# Patient Record
Sex: Female | Born: 1955 | Race: White | Hispanic: No | Marital: Married | Smoking: Never smoker
Health system: Southern US, Community
[De-identification: ages and names within clinical notes are randomized; demographics above are authoritative.]

## PROBLEM LIST (undated history)

## (undated) DIAGNOSIS — E559 Vitamin D deficiency, unspecified: Secondary | ICD-10-CM

## (undated) DIAGNOSIS — Z9889 Other specified postprocedural states: Secondary | ICD-10-CM

## (undated) DIAGNOSIS — T8859XA Other complications of anesthesia, initial encounter: Secondary | ICD-10-CM

## (undated) DIAGNOSIS — C50111 Malignant neoplasm of central portion of right female breast: Principal | ICD-10-CM

## (undated) DIAGNOSIS — Q7240 Longitudinal reduction defect of unspecified femur: Secondary | ICD-10-CM

## (undated) DIAGNOSIS — R42 Dizziness and giddiness: Secondary | ICD-10-CM

## (undated) DIAGNOSIS — Z9710 Presence of artificial limb (complete) (partial), unspecified: Secondary | ICD-10-CM

## (undated) DIAGNOSIS — T4145XA Adverse effect of unspecified anesthetic, initial encounter: Secondary | ICD-10-CM

## (undated) DIAGNOSIS — K219 Gastro-esophageal reflux disease without esophagitis: Secondary | ICD-10-CM

## (undated) DIAGNOSIS — R112 Nausea with vomiting, unspecified: Secondary | ICD-10-CM

## (undated) DIAGNOSIS — IMO0002 Reserved for concepts with insufficient information to code with codable children: Secondary | ICD-10-CM

## (undated) DIAGNOSIS — Z923 Personal history of irradiation: Secondary | ICD-10-CM

## (undated) DIAGNOSIS — M4800 Spinal stenosis, site unspecified: Secondary | ICD-10-CM

## (undated) HISTORY — DX: Spinal stenosis, site unspecified: M48.00

## (undated) HISTORY — DX: Personal history of irradiation: Z92.3

## (undated) HISTORY — DX: Malignant neoplasm of central portion of right female breast: C50.111

## (undated) HISTORY — PX: BREAST LUMPECTOMY: SHX2

## (undated) HISTORY — DX: Dizziness and giddiness: R42

## (undated) HISTORY — DX: Vitamin D deficiency, unspecified: E55.9

## (undated) HISTORY — DX: Longitudinal reduction defect of unspecified femur: Q72.40

## (undated) HISTORY — DX: Presence of artificial limb (complete) (partial), unspecified: Z97.10

## (undated) HISTORY — DX: Gastro-esophageal reflux disease without esophagitis: K21.9

## (undated) HISTORY — DX: Reserved for concepts with insufficient information to code with codable children: IMO0002

---

## 1998-06-09 ENCOUNTER — Encounter: Admission: RE | Admit: 1998-06-09 | Discharge: 1998-09-07 | Payer: Self-pay | Admitting: Family Medicine

## 1998-07-22 ENCOUNTER — Encounter: Admission: RE | Admit: 1998-07-22 | Discharge: 1998-10-20 | Payer: Self-pay | Admitting: Orthopedic Surgery

## 1998-11-24 ENCOUNTER — Encounter: Admission: RE | Admit: 1998-11-24 | Discharge: 1999-01-07 | Payer: Self-pay | Admitting: Family Medicine

## 1999-01-06 ENCOUNTER — Encounter: Admission: RE | Admit: 1999-01-06 | Discharge: 1999-02-25 | Payer: Self-pay

## 2007-04-06 ENCOUNTER — Ambulatory Visit (HOSPITAL_COMMUNITY): Admission: RE | Admit: 2007-04-06 | Discharge: 2007-04-06 | Payer: Self-pay | Admitting: Obstetrics and Gynecology

## 2008-02-12 ENCOUNTER — Inpatient Hospital Stay (HOSPITAL_COMMUNITY): Admission: RE | Admit: 2008-02-12 | Discharge: 2008-02-15 | Payer: Self-pay | Admitting: Orthopedic Surgery

## 2008-09-27 HISTORY — PX: REPLACEMENT TOTAL KNEE: SUR1224

## 2009-12-23 ENCOUNTER — Ambulatory Visit (HOSPITAL_COMMUNITY): Admission: RE | Admit: 2009-12-23 | Discharge: 2009-12-23 | Payer: Self-pay | Admitting: Obstetrics and Gynecology

## 2010-09-30 ENCOUNTER — Ambulatory Visit
Admission: RE | Admit: 2010-09-30 | Discharge: 2010-09-30 | Payer: Self-pay | Source: Home / Self Care | Attending: Gynecologic Oncology | Admitting: Gynecologic Oncology

## 2010-10-06 ENCOUNTER — Encounter: Payer: Self-pay | Admitting: Obstetrics & Gynecology

## 2010-10-06 ENCOUNTER — Ambulatory Visit (HOSPITAL_COMMUNITY)
Admission: RE | Admit: 2010-10-06 | Discharge: 2010-10-07 | Payer: Self-pay | Source: Home / Self Care | Attending: Obstetrics & Gynecology | Admitting: Obstetrics & Gynecology

## 2010-10-08 HISTORY — PX: ROBOTIC ASSISTED TOTAL HYSTERECTOMY WITH BILATERAL SALPINGO OOPHERECTOMY: SHX6086

## 2010-10-12 LAB — CBC
HCT: 40.2 % (ref 36.0–46.0)
HCT: 35.8 % — ABNORMAL LOW (ref 36.0–46.0)
Hemoglobin: 13.5 g/dL (ref 12.0–15.0)
Hemoglobin: 11.5 g/dL — ABNORMAL LOW (ref 12.0–15.0)
MCH: 30.2 pg (ref 26.0–34.0)
MCH: 29.3 pg (ref 26.0–34.0)
MCHC: 33.6 g/dL (ref 30.0–36.0)
MCHC: 32.1 g/dL (ref 30.0–36.0)
MCV: 89.9 fL (ref 78.0–100.0)
MCV: 91.1 fL (ref 78.0–100.0)
Platelets: 307 10*3/uL (ref 150–400)
Platelets: 272 10*3/uL (ref 150–400)
RBC: 4.47 MIL/uL (ref 3.87–5.11)
RBC: 3.93 MIL/uL (ref 3.87–5.11)
RDW: 13.2 % (ref 11.5–15.5)
RDW: 13.1 % (ref 11.5–15.5)
WBC: 7.5 10*3/uL (ref 4.0–10.5)
WBC: 12.7 10*3/uL — ABNORMAL HIGH (ref 4.0–10.5)

## 2010-10-12 LAB — COMPREHENSIVE METABOLIC PANEL
ALT: 24 U/L (ref 0–35)
AST: 23 U/L (ref 0–37)
Albumin: 4 g/dL (ref 3.5–5.2)
Alkaline Phosphatase: 113 U/L (ref 39–117)
BUN: 13 mg/dL (ref 6–23)
CO2: 25 mEq/L (ref 19–32)
Calcium: 9.2 mg/dL (ref 8.4–10.5)
Chloride: 109 mEq/L (ref 96–112)
Creatinine, Ser: 1.25 mg/dL — ABNORMAL HIGH (ref 0.4–1.2)
GFR calc Af Amer: 54 mL/min — ABNORMAL LOW (ref >60)
GFR calc non Af Amer: 44 mL/min — ABNORMAL LOW (ref >60)
Glucose, Bld: 88 mg/dL (ref 70–99)
Potassium: 4.8 mEq/L (ref 3.5–5.1)
Sodium: 142 mEq/L (ref 135–145)
Total Bilirubin: 0.5 mg/dL (ref 0.3–1.2)
Total Protein: 7.6 g/dL (ref 6.0–8.3)

## 2010-10-12 LAB — TYPE AND SCREEN
ABO/RH(D): O POS
Antibody Screen: NEGATIVE

## 2010-10-12 LAB — DIFFERENTIAL
Basophils Absolute: 0 10*3/uL (ref 0.0–0.1)
Basophils Relative: 1 % (ref 0–1)
Eosinophils Absolute: 0.1 10*3/uL (ref 0.0–0.7)
Eosinophils Relative: 2 % (ref 0–5)
Lymphocytes Relative: 25 % (ref 12–46)
Lymphs Abs: 1.9 10*3/uL (ref 0.7–4.0)
Monocytes Absolute: 0.4 10*3/uL (ref 0.1–1.0)
Monocytes Relative: 6 % (ref 3–12)
Neutro Abs: 5.1 10*3/uL (ref 1.7–7.7)
Neutrophils Relative %: 67 % (ref 43–77)

## 2010-10-12 LAB — SURGICAL PCR SCREEN
MRSA, PCR: NEGATIVE
Staphylococcus aureus: NEGATIVE

## 2010-10-18 ENCOUNTER — Encounter: Payer: Self-pay | Admitting: Obstetrics and Gynecology

## 2010-11-04 ENCOUNTER — Ambulatory Visit: Payer: BC Managed Care – PPO | Attending: Gynecologic Oncology | Admitting: Gynecologic Oncology

## 2010-11-04 DIAGNOSIS — Z09 Encounter for follow-up examination after completed treatment for conditions other than malignant neoplasm: Secondary | ICD-10-CM | POA: Insufficient documentation

## 2010-11-04 DIAGNOSIS — C549 Malignant neoplasm of corpus uteri, unspecified: Secondary | ICD-10-CM | POA: Insufficient documentation

## 2010-11-04 DIAGNOSIS — Z90710 Acquired absence of both cervix and uterus: Secondary | ICD-10-CM | POA: Insufficient documentation

## 2010-11-06 NOTE — Consult Note (Signed)
NAME:  Raven Marks, Raven Marks             ACCOUNT NO.:  [id number]  MEDICAL RECORD NO.:  [id number]          PATIENT TYPE:  OIB  LOCATION:  1537                         FACILITY:  [facility]  PHYSICIAN:  Itsel Opfer A. Duard Brady, MD    DATE OF BIRTH:  08-31-1956  DATE OF CONSULTATION:  11/04/2010 DATE OF DISCHARGE:  10/07/2010                                CONSULTATION   HISTORY OF PRESENT ILLNESS:  Ms. Ghosh is a very pleasant 55 year old who is referred to [place] secondary to an endometrial biopsy that revealed a grade 1 endometrioid adenocarcinoma.  Subsequently on October 07, 2010, she underwent robotic hysterectomy, BSO, pelvic and right paraaortic lymph node dissection.  Operative findings included a globular fibroid uterus.  The uterus was 10 weeks' size.  Frozen section revealed a grade 2 lesion with less than but close to 50% myometrial invasion.  She did well postoperatively.  Final pathology was consistent with a stage IA grade 2 endometrioid adenocarcinoma.  She had a grade 2 lesion 0.7 out of 1.2 cm in thickness.  There was no lymphovascular space involvement. The 0/12 lymph nodes were involved.  I did speak with her regarding her pathology last week.  She comes in today for her postoperative check. She is overall doing quite well.  She does complain of a little bit of anxiety in the late afternoon when her husband goes to work.  She does some deep breathing exercises and it resolves itself.  She would however want a prescription for Ativan.  She does have some occasional bladder twinges and a little bit of spotting that started this week but is otherwise doing well.  She feels that she will be ready to return to work next Thursday and a note will be given to her to that effect.  Last week on the telephone, we had discussed the need for proceeding with postoperative vaginal cuff brachytherapy secondary to the depth of invasion, the grade, as well as her age.  She is willing to proceed  with that.  PHYSICAL EXAMINATION:  VITAL SIGNS:  Weight 174 pounds, height 5 feet 4 inches, blood pressure 110/78, pulse 74, respirations 18, temperature 98.2. GENERAL:  A well-nourished, well developed female in no acute distress. ABDOMEN:  She has well-healed surgical incisions.  Abdomen is soft and nontender. PELVIC:  External genitalia within normal limits.  The vaginal cuff is healing well.  Suture line is still visible.  Bimanual examination feels no masses or nodularity.  The cuff is nontender.  ASSESSMENT AND PLAN:  A 55-year with a stage IB grade 2 endometrioid adenocarcinoma.  She is doing well postoperatively.  She still has her suture line intact.  Discussed with her and her husband not to proceed with intercourse for several weeks.  She has an appointment to see Dr. Roselind Messier next week on November 09, 2010, for consideration of vaginal cuff brachytherapy.  This procedure was discussed with the patient.  She knows that final details will be given to her by Dr. Roselind Messier.  She will return to see [place] for followup once she completes that radiation therapy.  Her questions were listed and answered to her  satisfaction.  She is very comfortable with the plan.     Bayne Fosnaugh A. Duard Brady, MD     PAG/MEDQ  D:  11/04/2010  T:  11/04/2010  Job:  [id number]  cc:   M. Leda Quail, MD Fax: [phone]  Telford Nab, R.N. [address] [place], [place] [address]  Billie Lade, M.D. Fax: [phone]  Electronically Signed by Cleda Mccreedy MD on 11/06/2010 09:53:30 AM

## 2010-11-09 ENCOUNTER — Ambulatory Visit: Payer: BC Managed Care – PPO | Attending: Radiation Oncology | Admitting: Radiation Oncology

## 2010-11-09 DIAGNOSIS — Z79899 Other long term (current) drug therapy: Secondary | ICD-10-CM | POA: Insufficient documentation

## 2010-11-09 DIAGNOSIS — C549 Malignant neoplasm of corpus uteri, unspecified: Secondary | ICD-10-CM | POA: Insufficient documentation

## 2010-11-09 DIAGNOSIS — Z90710 Acquired absence of both cervix and uterus: Secondary | ICD-10-CM | POA: Insufficient documentation

## 2010-11-09 DIAGNOSIS — Z800 Family history of malignant neoplasm of digestive organs: Secondary | ICD-10-CM | POA: Insufficient documentation

## 2010-11-09 DIAGNOSIS — Z9079 Acquired absence of other genital organ(s): Secondary | ICD-10-CM | POA: Insufficient documentation

## 2010-11-09 DIAGNOSIS — Z510 Encounter for antineoplastic radiation therapy: Secondary | ICD-10-CM | POA: Insufficient documentation

## 2010-11-09 DIAGNOSIS — Z96659 Presence of unspecified artificial knee joint: Secondary | ICD-10-CM | POA: Insufficient documentation

## 2011-02-09 NOTE — H&P (Signed)
NAME:  Raven Marks, Raven Marks             ACCOUNT NO.:  [id number]   MEDICAL RECORD NO.:  [id number]          PATIENT TYPE:  INP   LOCATION:  NA                           FACILITY:  [facility]   PHYSICIAN:  Alexzandrew L. Perkins, P.A.C.DATE OF BIRTH:  Feb 15, 1956   DATE OF ADMISSION:  DATE OF DISCHARGE:                              HISTORY & PHYSICAL   CHIEF COMPLAINT:  Left knee pain.   HISTORY OF PRESENT ILLNESS:  A 55 year old female who has been seen by  Dr. Lequita Halt for ongoing knee pain.  She has end-stage arthritis and it  has been progressively getting worse interfering with activities.  She  is noted to be bone on bone in medial and patellofemoral compartments.  It was felt she would benefit undergoing surgery.  Risks and benefits  discussed.  Patient subsequently admitted to the hospital.  She has been  seen by Dr. Elana Alm preoperatively and felt to be stable to undergo  surgery.   ALLERGIES:  PENICILLIN.   CURRENT MEDICATIONS:  Advil, Tylenol, Sudafed, Dramamine, vitamins,  Pepcid AC.   PAST MEDICAL HISTORY:  1. Vertigo.  2. Anxiety.  3. Tinnitus.  4. History of bronchitis.  5. Occasional heartburn.  6. Arthritis.  7. Postmenopausal.   PAST SURGICAL HISTORY:  Knee arthroscopy, June of 2008   FAMILY HISTORY:  Father deceased with history of colon cancer.  Mother  deceased with history of colon cancer.  One son and two sisters.   SOCIAL HISTORY:  Married, works as a Runner, broadcasting/film/video.  Nonsmoker.  No alcohol.  Family will be assisting with care after surgery.   REVIEW OF SYSTEMS:  GENERAL:  No fevers, chills, occasional mild night  sweats.  NEURO:  No seizures, syncope, or paralysis.  She does have a  history of vertigo with dizziness and tinnitus.  HEENT: Normocephalic,  atraumatic.      Alexzandrew L. Perkins, P.A.C.     ALP/MEDQ  D:  02/11/2008  T:  02/11/2008  Job:  [id number]   cc:   S. Kyra Manges, M.D.  Fax: [phone]   Ollen Gross, M.D.  Fax: [phone]

## 2011-02-09 NOTE — H&P (Signed)
NAME:  Raven Marks, Raven Marks NO.:  [id number]   MEDICAL RECORD NO.:  [id number]           PATIENT TYPE:   LOCATION:                                 FACILITY:   PHYSICIAN:  Ollen Gross, M.D.    DATE OF BIRTH:  1955/10/29   DATE OF ADMISSION:  02/12/2008  DATE OF DISCHARGE:                              HISTORY & PHYSICAL   CHIEF COMPLAINT:  Left knee pain.   HISTORY OF PRESENT ILLNESS:  The patient is a 55 year old female who has  seen by Dr. Lequita Halt for ongoing left knee pain.  She has known end-stage  arthritis with bone-on-bone in the medial and patellofemoral  compartments.  It is progressively getting worse with time.  It is felt  she would benefit undergoing surgical intervention.  Risks and benefits  were discussed.  The patient was subsequently admitted to the hospital.  She has been seen preoperatively by Dr. Elana Alm and is felt to be stable  for surgery.   ALLERGIES:  PENICILLIN.   CURRENT MEDICATIONS:  1. Advil.  2. Tylenol.  3. Sudafed.  4. Dramamine.  5. Vitamins.  6. Pepcid AC.   PAST MEDICAL HISTORY:  Vertigo, anxiety, tinnitus, bronchitis,  occasional heartburn, arthritis, postmenopausal.   PAST SURGICAL HISTORY:  Knee arthroscopy.   FAMILY HISTORY:  Father deceased with colon cancer.  Mother deceased  with colon cancer.  One son and two sisters.   SOCIAL HISTORY:  Married, Runner, broadcasting/film/video, nonsmoker.  No alcohol.  Family will  be assisting with care after surgery.   REVIEW OF SYSTEMS:  GENERAL:  No fevers, chills, occasional mild night  sweats.  NEURO:  No seizures or paralysis although she does have vertigo  and tinnitus.  RESPIRATORY:  No shortness breath, productive cough or  hemoptysis.  CARDIOVASCULAR:  No chest pain, angina, orthopnea.  GI:  Occasional heartburn.  No nausea, diarrhea, constipation.  GU:  No  dysuria, hematuria or discharge.  MUSCULOSKELETAL:  Left knee.   PHYSICAL EXAMINATION:  VITAL SIGNS:  Pulse 72, respirations 14,  blood  pressure 118/72.  GENERAL:  A 55 year old female well-nourished, well-developed, in no  acute distress.  She is alert and oriented, cooperative, pleasant and  good historian, accompanied by her husband.  HEENT:  Normocephalic, atraumatic.  Pupils are round and reactive.  Oropharynx clear.  EOMs intact.  NECK:  Supple.  CHEST:  Clear anterior and posterior chest walls.  HEART:  Regular rate and rhythm.  No murmur.  ABDOMEN:  Soft, slightly round.  Bowel sounds present.  BREASTS:  Not done and not pertinent to present illness.  GENITALIA:  Not done and not pertinent to present illness.  EXTREMITIES:  Left knee range of motion 0-120, moderate crepitus is  noted.  No effusion.  The right knee, please note she has an above knee  amputation.  She does have a long prosthetic leg already fit.   IMPRESSION:  1. Osteoarthritis left knee.  2. Vertigo.  3. Anxiety.  4. Tinnitus.  5. Bronchitis.  6. Occasional heartburn.  7. Postmenopausal.  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   PLAN:  The patient will be admitted to [facility] to undergo a  left total knee replacement arthroplasty.  Surgery will be performed by  Dr. Ollen Gross.      Alexzandrew L. Perkins, P.A.C.      Ollen Gross, M.D.  Electronically Signed    ALP/MEDQ  D:  02/11/2008  T:  02/11/2008  Job:  [id number]   cc:   Ollen Gross, M.D.  Fax: [phone]   S. Kyra Manges, M.D.  Fax: [phone]

## 2011-02-09 NOTE — Op Note (Signed)
NAMELUDIA, Marks             ACCOUNT NO.:  [id number]   MEDICAL RECORD NO.:  [id number]          PATIENT TYPE:  INP   LOCATION:  1613                         FACILITY:  [facility]   PHYSICIAN:  Ollen Gross, M.D.    DATE OF BIRTH:  [birth year]   DATE OF PROCEDURE:  02/12/2008  DATE OF DISCHARGE:                               OPERATIVE REPORT   PREOPERATIVE DIAGNOSIS:  Osteoarthritis left knee.   POSTOPERATIVE DIAGNOSIS:  Osteoarthritis left knee.   PROCEDURE:  Left total knee arthroplasty.   SURGEON:  Ollen Gross, M.D.   ASSISTANT:  Alexzandrew L. Perkins, P.A.C.   ANESTHESIA:  General with postop Marcaine pain pump.   ESTIMATED BLOOD LOSS:  Minimal.   DRAINS:  None.   COMPLICATIONS:  None.   DISPOSITION:  To recovery.   TOURNIQUET TIME:  31 minutes at 300 mmHg.   CLINICAL NOTE:  Raven Marks is a 55 year old female with end-stage arthritis  of left knee with progressively worsening pain and dysfunction.  She is  an above-knee amputee on the right; thus relies heavily on her left knee  for weightbearing activities.  She has end-stage arthritis of the knee.  She presents now for left total knee arthroplasty.   PROCEDURE IN DETAIL:  After the successful administration of general  anesthetic a tourniquet is placed on the left thigh and left lower  extremity was prepped and draped in the usual sterile fashion.  The  extremity is wrapped in Esmarch, knee flexed and tourniquet inflated to  300 mmHg.  Midline incision is a 10 blade through subcutaneous tissue to  the level of the extensor mechanism.  A fresh blade is used make a  medial parapatellar arthrotomy.  Soft tissue over the proximal medial  tibia is subperiosteally elevated to the joint line with the knife into  the center of this bursa with a Cobb elevator.  Soft tissue laterally is  elevated with attention being paid to avoid the patellar tendon on the  tibial tubercle.  The patella subluxed laterally, knee flexed  90  degrees, ACL and PCL removed.  Drill was used to create a starting hole  in the distal femur and the canal was thoroughly irrigated.  The 5-  degree left valgus alignment guide is placed referencing the posterior  condyles,rotations marked and a block pinned to remove 10 mm of the  distal femur.  Distal femoral resection made with an oscillating saw.  Sizing blocks placed.  Size 3 is most appropriate.  Rotation is marked  at the epicondylar axis.  A size 3 cutting block is placed and the  anterior-posterior chamfer cuts made.   The tibia is subluxed forward and the menisci removed.  The  extramedullary tibial alignment guide is placed referencing proximally  at the medial aspect of the tibial tubercle and distally along the  second metatarsal axis and tibial crest.  Blocks pinned to remove  approximately 10 mm off the less deficient lateral side.  We took an  additional 2 mm to get to the base of the medial defect.  The resection  is made with an oscillating saw.  Size 3 is the most appropriate tibial  component and the proximal tibia prepared the modular drill and keel  punch for a size 3.  Femoral preparation is completed with the  intercondylar cut.   Size 3 mobile bearing tibial trial, size 3 posterior stabilized femoral  trial and a 12.5 mm posterior stabilized rotating platform insert trial  placed.  The 12.5 hyperextended but the 15  allowed for full extension  with excellent varus/ valgus, anterior and posterior balance throughout  full range of motion.  Patella was everted and thickness measured to be  23 mm.  Freehand resection taken to 14 mm, 38 template is placed, lug  holes were drilled, trial patella was placed and it tracks normally.  Osteophytes removed off the posterior femur with the trial in place.  All trials were removed and the cut bone surfaces are prepared with  pulsatile lavage.  Cement was mixed and once ready for implantation, the  size 3 mobile bearing  tibial tray, size 3 posterior stabilized femur and  38 patella are cemented into place. Patella is held with a clamp.  Trial  15-mm inserts placed, knee held in full extension and all extruded  cement removed.  The cement is fully hardened and the permanent 15 mm  posterior stabilized rotating platform insert is placed into the tibial  tray.  Wound copiously irrigated saline solution.  FloSeal injected on  the posterior capsule and in the mediolateral gutters.  The permanent 15  mm posterior stabilized rotating platform insert had been placed in the  tibial tray.  Moist sponge is placed and the tourniquet released for  total time of 31 minutes.  Sponge is released after about 5 minutes and  minimal bleeding was encountered.  The bleeding that was encountered was  stopped with electrocautery.  Wounds again copiously irrigated saline  solution and the extensor mechanism closed with interrupted #1 PDS.  Flexion against gravity to 135 degrees.  Subcu closed with interrupted 2-  0 Vicryl and subcuticular running 4-0 Monocryl.  The catheter for  Marcaine pain pump is placed and the pump initiated.  Steri-Strips and  bulky sterile dressing are applied.  She is then placed into a knee  immobilizer, awakened and transferred to recovery in stable condition.      Ollen Gross, M.D.  Electronically Signed     FA/MEDQ  D:  02/12/2008  T:  02/12/2008  Job:  [id number]

## 2011-02-09 NOTE — Discharge Summary (Signed)
NAMEDENIESE, OBERRY             ACCOUNT NO.:  [id number]   MEDICAL RECORD NO.:  [id number]          PATIENT TYPE:  INP   LOCATION:  1613                         [facility]ACILITY[facility]   PHYSICIAN:  Ollen Gross, M.D.    DATE OF BIRTH:  18-May-1956   DATE OF ADMISSION:  02/12/2008  DATE OF DISCHARGE:  02/15/2008                               DISCHARGE SUMMARY   ADMITTING DIAGNOSES:  1. Osteoarthritis left knee.  2. Vertigo.  3. Anxiety.  4. Tinnitus.  5. Bronchitis.  6. Occasional heartburn  7. Postmenopausal.  8. Right above-knee amputation with prosthetic leg.   DISCHARGE DIAGNOSES:  1. Osteoarthritis left knee, status post left total knee replacement      arthroplasty.  2. Acute postoperative blood loss anemia, status post transfusion.  3. Vertigo.  4. Anxiety.  5. Tinnitus.  6. Bronchitis.  7. Occasional heartburn  8. Postmenopausal.  9. Right above-knee amputation with prosthetic leg.   PROCEDURE:  On Feb 12, 2008, left total knee.   SURGEON:  Dr. Lequita Halt.   ASSISTANT:  Alexzandrew L. Perkins, P.A.C.   ANESTHESIA:  General.   CONSULTS:  None.   BRIEF HISTORY:  Raven Marks is a 52-year female with end-stage arthritis of  the left knee with progressive worsening pain and dysfunction.  She has  an above-knee the patient on the right, thus relies on the left leg for  weightbearing activity.  She has end-stage arthritis and now presents  for total knee arthroplasty.   LABORATORY DATA:  Preop CBC showed a hemoglobin of 13.7, hematocrit of  40.0 white cell count 6.8, platelets 291.  Chem panel on admission all  within normal limits.  PT/INR 12.1 and 0.9 with a PT of 28  preoperatively.  Preop UA, small leukocyte esterase with only 0-2 white  cells, 0-2 red cells and rare bacteria.   CBCs were followed throughout the hospital course.  Hemoglobin dropped  down to 9.6 and to 8.3, and given 1 unit of blood.  Post transfusion  hemoglobin back up to 9.9 and 28.3.  Serial  protimes followed, last  noted PT/INR 21.7 and 1.80.  BMETs were followed.  Electrolytes remained  within normal limits.   EKG on Feb 08, 2008, normal sinus rhythm, no previous tracings,  confirmed by Dr. Vernie Shanks. DeGent.   HOSPITAL COURSE:  The patient admitted to [facility],  tolerated the procedure well, later transferred to the recovery room and  orthopedic floor.  Started on PCA and p.o. analgesics for pain control  following surgery.  Given 24 hours postop IV antibiotics.  Started on  Coumadin for DVT prophylaxis.  He had a very tough night with pain.  Changed medications around, did better with the Vicodin.  He was not  able to get much rest.  Seen in rounds on the morning of day #1,  recommend that she doff and don the prosthetic leg while in the bed to  help her with transferring.  Physical therapy started working with her.  There was a lot of bloody drainage through the dressing and was  reinforced and changed through the night.  We took down the reinforced  dressing on the morning of day #1.  The incision was healing well, no  active bleeding.  She had a little bit of asymptomatic hypotension, so  we gave her fluids.  Her hemoglobin was down a little bit, down to 9.6.  although she was asymptomatic with the hypotension and the anemia, we  put her on iron supplement.  She did have a history of vertigo, so we  resumed her meclizine.  By day #2, she had a rough night, headache.  We  encouraged her some caffeine.  We felt it could also be due to possibly  having the anemia.  Her hemoglobin drifted down a little bit further to  8.3.  Dressing change, and the incision looked good.  We felt we would  give her 1 unit.  She actually did extremely well with the unit and felt  much better.  She continued to progress with physical therapy and was  walking about 70 feet on day #2, despite having a low blood count.  She  was doing great by day #3.  Hemoglobin was back up to 9.9,  feeling good  and was discharged home.   DISCHARGE/PLAN:  1. Patient discharged home on Feb 15, 2008.  2. Discharge diagnoses, please see above.  3. Discharge meds - Vicodin, Robaxin, Nu-Iron and Coumadin.   DIET:  Resume home diet.   ACTIVITY:  Weightbearing as tolerated left lower extremity.  Home health  PT, home health nursing, total knee protocol.   DISPOSITION:  Home.  Follow-up in 2 weeks.   CONDITION ON DISCHARGE:  Improved.      Alexzandrew L. Perkins, P.A.C.      Ollen Gross, M.D.  Electronically Signed    ALP/MEDQ  D:  02/15/2008  T:  02/15/2008  Job:  [id number]   cc:   Ollen Gross, M.D.  Fax: [phone]   S. Kyra Manges, M.D.  Fax: [phone]

## 2011-03-17 ENCOUNTER — Other Ambulatory Visit: Payer: Self-pay | Admitting: Radiation Oncology

## 2011-03-17 ENCOUNTER — Ambulatory Visit
Admission: RE | Admit: 2011-03-17 | Discharge: 2011-03-17 | Disposition: A | Discharge status: Home / Self Care | Payer: BC Managed Care – PPO | Source: Ambulatory Visit | Attending: Radiation Oncology | Admitting: Radiation Oncology

## 2011-03-17 ENCOUNTER — Other Ambulatory Visit (HOSPITAL_COMMUNITY)
Admission: RE | Admit: 2011-03-17 | Discharge: 2011-03-17 | Disposition: A | Discharge status: Home / Self Care | Payer: BC Managed Care – PPO | Source: Ambulatory Visit | Attending: Radiation Oncology | Admitting: Radiation Oncology

## 2011-03-17 DIAGNOSIS — C549 Malignant neoplasm of corpus uteri, unspecified: Secondary | ICD-10-CM | POA: Insufficient documentation

## 2011-06-23 LAB — BASIC METABOLIC PANEL
BUN: 4 — ABNORMAL LOW
BUN: 5 — ABNORMAL LOW
BUN: 8
CO2: 26
CO2: 27
CO2: 28
Calcium: 7.9 — ABNORMAL LOW
Calcium: 7.9 — ABNORMAL LOW
Calcium: 8.3 — ABNORMAL LOW
Chloride: 104
Chloride: 107
Chloride: 110
Creatinine, Ser: 0.72
Creatinine, Ser: 0.72
Creatinine, Ser: 0.72
GFR calc Af Amer: >60
GFR calc Af Amer: >60
GFR calc Af Amer: >60
GFR calc non Af Amer: >60
GFR calc non Af Amer: >60
GFR calc non Af Amer: >60
Glucose, Bld: 108 — ABNORMAL HIGH
Glucose, Bld: 138 — ABNORMAL HIGH
Glucose, Bld: 89
Potassium: 3.6
Potassium: 3.9
Potassium: 4.7
Sodium: 140
Sodium: 140
Sodium: 141

## 2011-06-23 LAB — CBC
HCT: 24.1 — ABNORMAL LOW
HCT: 27.8 — ABNORMAL LOW
HCT: 28.3 — ABNORMAL LOW
Hemoglobin: 8.3 — ABNORMAL LOW
Hemoglobin: 9.6 — ABNORMAL LOW
Hemoglobin: 9.9 — ABNORMAL LOW
MCHC: 34.3
MCHC: 34.4
MCHC: 34.9
MCV: 87.8
MCV: 87.9
MCV: 88.3
Platelets: 176
Platelets: 200
Platelets: 205
RBC: 2.74 — ABNORMAL LOW
RBC: 3.14 — ABNORMAL LOW
RBC: 3.22 — ABNORMAL LOW
RDW: 12.7
RDW: 13
RDW: 13.3
WBC: 10.7 — ABNORMAL HIGH
WBC: 12.4 — ABNORMAL HIGH
WBC: 9.4

## 2011-06-23 LAB — TYPE AND SCREEN
ABO/RH(D): O POS
Antibody Screen: NEGATIVE

## 2011-06-23 LAB — PROTIME-INR
INR: 1.1
INR: 1.8 — ABNORMAL HIGH
INR: 1.8 — ABNORMAL HIGH
Prothrombin Time: 14.6
Prothrombin Time: 21 — ABNORMAL HIGH
Prothrombin Time: 21.7 — ABNORMAL HIGH

## 2011-06-23 LAB — PREPARE RBC (CROSSMATCH)

## 2011-06-23 LAB — ABO/RH: ABO/RH(D): O POS

## 2011-07-14 ENCOUNTER — Other Ambulatory Visit (HOSPITAL_COMMUNITY)
Admission: RE | Admit: 2011-07-14 | Discharge: 2011-07-14 | Disposition: A | Discharge status: Home / Self Care | Payer: BC Managed Care – PPO | Source: Ambulatory Visit | Attending: Gynecologic Oncology | Admitting: Gynecologic Oncology

## 2011-07-14 ENCOUNTER — Other Ambulatory Visit: Payer: Self-pay | Admitting: Gynecologic Oncology

## 2011-07-14 ENCOUNTER — Ambulatory Visit: Payer: BC Managed Care – PPO | Attending: Gynecologic Oncology | Admitting: Gynecologic Oncology

## 2011-07-14 DIAGNOSIS — Z90710 Acquired absence of both cervix and uterus: Secondary | ICD-10-CM | POA: Insufficient documentation

## 2011-07-14 DIAGNOSIS — C549 Malignant neoplasm of corpus uteri, unspecified: Secondary | ICD-10-CM | POA: Insufficient documentation

## 2011-07-14 DIAGNOSIS — Z9079 Acquired absence of other genital organ(s): Secondary | ICD-10-CM | POA: Insufficient documentation

## 2011-07-14 DIAGNOSIS — Z8540 Personal history of malignant neoplasm of unspecified female genital organ: Secondary | ICD-10-CM | POA: Insufficient documentation

## 2011-07-14 DIAGNOSIS — I890 Lymphedema, not elsewhere classified: Secondary | ICD-10-CM | POA: Insufficient documentation

## 2011-07-16 NOTE — Consult Note (Signed)
Raven Marks, Raven Marks             ACCOUNT NO.:  [id number]  MEDICAL RECORD NO.:  [id number]  LOCATION:  GYN                          FACILITY:  [facility]  PHYSICIAN:  Pecola Haxton A. Duard Brady, MD    DATE OF BIRTH:  11/15/1955  DATE OF CONSULTATION:  07/14/2011 DATE OF DISCHARGE:                                CONSULTATION   HISTORY OF PRESENT ILLNESS:  Raven Marks is a 55 year old who had a grade 1 endometrial cancer on biopsy.  In January 2012, she underwent robotic hysterectomy, BSO, bilateral pelvic and right para-aortic lymph node dissection.  Final pathology was consistent with a stage IA, grade 2 endometrioid adenocarcinoma.  She had grade 2 lesion with 0.7/1.2 cm of myometrial thickness.  There was no lymphovascular space involvement, 0/12 lymph nodes were involved.  She underwent postoperative radiation therapy under the care of Dr. Roselind Messier, consisting of HDR.  She was last seen by Dr. Roselind Messier in June 2012 at which time her exam and Pap smear were negative.   She comes in today for followup.  She is overall doing fairly well.  She does complain of some occasional lower abdominal cramping like a period.  It happens about once every few weeks.  It does not wake her up at night.  It lasts for a few minutes.  She also states that it is worse when she holds her bladder and she has not really thought about whether there is any pattern with her bowel, but does not require any pain medications.  She has some occasional hot flashes. Occasionally, she will have little bit of a white vaginal discharge. She denies any bleeding.  She states that since her surgery, but she is not exactly sure when it started, she has had a "sore spot" on her inner thigh when she palpates it.  It is sore to touch.  She has not really taken any ibuprofen or pain medicine to see if that helps.  Again, it has not limited her mobility and her activities, it is just sore to palpation.  She is very frustrated by her weight  gain, she has gained an additional 5 pounds since we last saw her.  She states she has never been this heavy and is very frustrated.  She did do Weight Watchers, lost a few pounds, but was frustrated by how slowly she was losing the weight and stopped being compliant.  PHYSICAL EXAMINATION:  VITAL SIGNS:  Weight 179 pounds, height 5 feet 4 inches, blood pressure 128/80, respirations 16, temperature 98. GENERAL:  Well-nourished, well-developed female, in no acute distress. NECK:  Supple.  There is no lymphadenopathy, no thyromegaly. LUNGS:  Clear to auscultation bilaterally. CARDIOVASCULAR:  Regular rate and rhythm. ABDOMEN:  She has well-healed surgical incisions.  Abdomen is soft, nontender, nondistended.  There are no palpable masses or hepatosplenomegaly.  Groins are negative for adenopathy. EXTREMITIES:  She has anatomically missing right leg with a prosthetic. The left leg is unremarkable. PELVIC:  External genitalia is notable for some swelling and asymmetry of the right labia majora, which is most consistent with lymphedema.  It is somewhat tender to touch.  The vagina is without lesions.  The vaginal cuff is visualized.  There are no visible lesions.  ThinPrep Pap was submitted without difficulty.  Bimanual examination reveals no masses or nodularity.  Rectal confirms.  ASSESSMENT: 59. A 55 year old with a stage IA, grade 2 endometrioid adenocarcinoma,     status post appropriate surgery and vaginal cuff brachytherapy who     appears to have lymphedema of her mons and it appears that, that in     conjunction with her weight gain is making her prosthetic device     not fit very well.  I discussed this with her, she is very relieved     to at least know that there might be an etiology for this and after     our discussion regarding lymphedema therapy, she is very much     interested in that.  A referral was made and she was given a phone     number for lymphedema therapy as well  as a prescription for     evaluation and treatment. 2. We will follow up on the results of her Pap smear from today. 3. She will see Dr. Roselind Messier in 3-4 months and return to see [place] in 6-8     months.     Raven Marks A. Duard Brady, MD     PAG/MEDQ  D:  07/14/2011  T:  07/14/2011  Job:  [id number]  cc:   Billie Lade, Ph.D., M.D. Fax: [phone]  Miguel Aschoff, M.D.  Telford Nab, R.N. [address] [place], [place] [address]  Leda Quail, MD  Electronically Signed by Cleda Mccreedy MD on 07/16/2011 11:07:11 AM

## 2012-01-10 ENCOUNTER — Ambulatory Visit
Admission: RE | Admit: 2012-01-10 | Discharge: 2012-01-10 | Disposition: A | Discharge status: Home / Self Care | Payer: BC Managed Care – PPO | Source: Ambulatory Visit | Attending: Family Medicine | Admitting: Family Medicine

## 2012-01-10 ENCOUNTER — Other Ambulatory Visit: Payer: Self-pay | Admitting: Family Medicine

## 2012-01-10 DIAGNOSIS — H539 Unspecified visual disturbance: Secondary | ICD-10-CM

## 2012-01-10 DIAGNOSIS — R51 Headache: Secondary | ICD-10-CM

## 2012-03-15 ENCOUNTER — Encounter: Payer: Self-pay | Admitting: Gynecologic Oncology

## 2012-03-16 ENCOUNTER — Encounter: Payer: Self-pay | Admitting: Gynecologic Oncology

## 2012-03-16 ENCOUNTER — Ambulatory Visit: Payer: BC Managed Care – PPO | Attending: Gynecologic Oncology | Admitting: Gynecologic Oncology

## 2012-03-16 ENCOUNTER — Other Ambulatory Visit (HOSPITAL_COMMUNITY)
Admission: RE | Admit: 2012-03-16 | Discharge: 2012-03-16 | Disposition: A | Discharge status: Home / Self Care | Payer: BC Managed Care – PPO | Source: Ambulatory Visit | Attending: Gynecologic Oncology | Admitting: Gynecologic Oncology

## 2012-03-16 VITALS — BP 148/76 | HR 78 | Temp 97.9°F | Resp 20 | Ht 64.0 in | Wt 184.0 lb

## 2012-03-16 DIAGNOSIS — Z90710 Acquired absence of both cervix and uterus: Secondary | ICD-10-CM | POA: Insufficient documentation

## 2012-03-16 DIAGNOSIS — C541 Malignant neoplasm of endometrium: Secondary | ICD-10-CM | POA: Insufficient documentation

## 2012-03-16 DIAGNOSIS — Z923 Personal history of irradiation: Secondary | ICD-10-CM | POA: Insufficient documentation

## 2012-03-16 DIAGNOSIS — C549 Malignant neoplasm of corpus uteri, unspecified: Secondary | ICD-10-CM | POA: Insufficient documentation

## 2012-03-16 DIAGNOSIS — Z01419 Encounter for gynecological examination (general) (routine) without abnormal findings: Secondary | ICD-10-CM | POA: Insufficient documentation

## 2012-03-16 DIAGNOSIS — K219 Gastro-esophageal reflux disease without esophagitis: Secondary | ICD-10-CM | POA: Insufficient documentation

## 2012-03-16 DIAGNOSIS — Z800 Family history of malignant neoplasm of digestive organs: Secondary | ICD-10-CM | POA: Insufficient documentation

## 2012-03-16 DIAGNOSIS — Z880 Allergy status to penicillin: Secondary | ICD-10-CM | POA: Insufficient documentation

## 2012-03-16 NOTE — Patient Instructions (Signed)
RTC to see Dr. Hyacinth Meeker in 6 months.

## 2012-03-16 NOTE — Progress Notes (Signed)
Consult Note: Gyn-Onc  Raven Marks 56 y.o. female  CC:  Chief Complaint  Patient presents with  . Endo ca    Follow up    HPI: HISTORY OF PRESENT ILLNESS: Raven Marks is a 56 year old who had a grade 1 endometrial cancer on biopsy. In January 2012, Raven Marks underwent robotic hysterectomy, BSO, bilateral pelvic and right para-aortic lymph  node dissection. Final pathology was consistent with a stage IA, grade 2 endometrioid adenocarcinoma. Raven Marks had grade 2 lesion with 0.7/1.2 cm of myometrial thickness. There was no lymphovascular space involvement,  0/12 lymph nodes were involved. Raven Marks underwent postoperative radiation therapy under the care of Dr. Roselind Messier, consisting of HDR. She was last seen by me in 10/12 at which time her exam and Pap smear  were negative.   Raven Marks comes in today for followup. Raven Marks is overall doing fairly well.   Interval History:  Raven Marks is scheduled for mammogram in August of this year.  Review of Systems Raven Marks still has occasional hot flashes and a few night sweats. They're the same they have not gotten any worse. It is not appear to have any relationship with Raven Marks's eating or drinking. They do not linger and they last about 5 minutes and essentially have been the same since I saw her in October. Raven Marks thinks that the hot flashes are overall tolerable. Raven Marks denies any vaginal bleeding. 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 Raven Marks does have some soreness on the mons. We diagnosed her with lymphedema when I last saw her and Raven Marks did not see the lymphedema therapist as Raven Marks thought it was better. Raven Marks would be interested in seeing them. Raven Marks did hurt her knee after a fall and has had some left knee swelling. Raven Marks will be seeing her physician for this. Raven Marks otherwise denies any complaints and is a 10 point negative review of systems. 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 The going to be [employer] for the summer vacation.  Current Meds:  Outpatient Encounter Prescriptions as of 03/16/2012  Medication Sig Dispense Refill  . Loratadine (CLARITIN PO) Take 10 mg by mouth.      . Olopatadine HCl (PATADAY) 0.2 % SOLN Apply 1 drop to eye.      Marland Kitchen omeprazole (PRILOSEC) 10 MG capsule Take 10 mg by mouth daily.        Allergy:  Allergies  Allergen Reactions  . Penicillins Rash    Social Hx:   History   Social History  . Marital Status: Married    Spouse Name: N/A    Number of Children: N/A  . Years of Education: N/A   Occupational History  . Not on file.   Social History Main Topics  . Smoking status: Never Smoker   . Smokeless tobacco: Not on file  . Alcohol Use: No  . Drug Use: No  . Sexually Active:    Other Topics Concern  . Not on file   Social History Narrative  . No narrative on file    Past Surgical Hx:  Past Surgical History  Procedure Date  . Cesarean section   . Replacement total knee     Left  . Abdominal hysterectomy 10/08/2010    Robotic, BSO, bilateral pelvic/ R periaortic LND    Past Medical Hx:  Past Medical History  Diagnosis Date  . GERD (gastroesophageal reflux disease)   .  Vertigo     associated with anesthesia  . Endometrioid adenocarcinoma     Stage IA, grade 2    Family Hx:  Family History  Problem Relation Age of Onset  . Colon cancer Mother   . Colon cancer Father     Vitals:  Blood pressure 148/76, pulse 78, temperature 97.9 F (36.6 C), temperature source Oral, resp. rate 20, height 5\' 4"  (1.626 m), weight 184 lb (83.462 kg).  Physical Exam:  GENERAL: Well-nourished, well-developed female, in no acute distress.   NECK: Supple. There is no lymphadenopathy, no thyromegaly.   LUNGS: Clear to auscultation bilaterally.   CARDIOVASCULAR: Regular rate and rhythm.   ABDOMEN: Raven Marks has well-healed surgical incisions. Abdomen is soft, nontender, nondistended. There are no palpable masses or  hepatosplenomegaly. Groins are negative for adenopathy.   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 The left leg is unremarkable.   PELVIC: External genitalia is notable for some swelling and asymmetry of the right labia majora, which is most consistent with lymphedema. It is somewhat tender to touch. The vagina is without lesions. The vaginal cuff is visualized. There are no visible lesions. ThinPrep Pap was submitted without difficulty. Bimanual examination reveals no masses or nodularity. Rectal confirms.   ASSESSMENT:  A 56 year old with a stage IA, grade 2 endometrioid adenocarcinoma, status post appropriate surgery and vaginal cuff brachytherapy. 2. We will follow up on the results of her Pap smear from today.  3. Referral to lymphedema therapy was made. 4. Raven Marks will see Dr. Hyacinth Meeker in 6 months and return to see [place] in 12 months.      Xena Propst A., MD 03/16/2012, 2:20 PM

## 2012-03-20 ENCOUNTER — Telehealth: Payer: Self-pay | Admitting: Gynecologic Oncology

## 2012-03-20 NOTE — Telephone Encounter (Signed)
Attempted to call patient with pap smear results.  No answering machine available at the home number.  Called the work number but an employee stated that the patient did not work there. Will attempt to notify the patient at home at a later time.

## 2012-03-21 ENCOUNTER — Telehealth: Payer: Self-pay | Admitting: Gynecologic Oncology

## 2012-03-21 NOTE — Telephone Encounter (Signed)
Attempting to call pap results.  Unable to leave a message.  Will try again at a later time.

## 2012-03-22 ENCOUNTER — Telehealth: Payer: Self-pay | Admitting: Gynecologic Oncology

## 2012-03-22 ENCOUNTER — Other Ambulatory Visit: Payer: Self-pay | Admitting: Gastroenterology

## 2012-03-22 NOTE — Telephone Encounter (Signed)
Pt notified about pap results: negative.  No questions or concerns voiced. 

## 2012-05-24 ENCOUNTER — Other Ambulatory Visit: Payer: Self-pay | Admitting: Specialist

## 2012-05-24 DIAGNOSIS — IMO0002 Reserved for concepts with insufficient information to code with codable children: Secondary | ICD-10-CM

## 2012-06-02 ENCOUNTER — Other Ambulatory Visit: Payer: BC Managed Care – PPO

## 2012-06-07 ENCOUNTER — Ambulatory Visit
Admission: RE | Admit: 2012-06-07 | Discharge: 2012-06-07 | Disposition: A | Discharge status: Home / Self Care | Payer: Worker's Compensation | Source: Ambulatory Visit | Attending: Specialist | Admitting: Specialist

## 2012-06-07 DIAGNOSIS — IMO0002 Reserved for concepts with insufficient information to code with codable children: Secondary | ICD-10-CM

## 2012-09-27 DIAGNOSIS — E559 Vitamin D deficiency, unspecified: Secondary | ICD-10-CM

## 2012-09-27 HISTORY — DX: Vitamin D deficiency, unspecified: E55.9

## 2013-04-25 ENCOUNTER — Encounter: Payer: Self-pay | Admitting: Gynecologic Oncology

## 2013-04-25 ENCOUNTER — Other Ambulatory Visit (HOSPITAL_COMMUNITY)
Admission: RE | Admit: 2013-04-25 | Discharge: 2013-04-25 | Disposition: A | Discharge status: Home / Self Care | Payer: BC Managed Care – PPO | Source: Ambulatory Visit | Attending: Gynecologic Oncology | Admitting: Gynecologic Oncology

## 2013-04-25 ENCOUNTER — Ambulatory Visit: Payer: BC Managed Care – PPO | Attending: Gynecologic Oncology | Admitting: Gynecologic Oncology

## 2013-04-25 VITALS — BP 122/80 | HR 70 | Temp 98.7°F | Resp 16 | Ht 64.0 in | Wt 180.0 lb

## 2013-04-25 DIAGNOSIS — C549 Malignant neoplasm of corpus uteri, unspecified: Secondary | ICD-10-CM | POA: Insufficient documentation

## 2013-04-25 DIAGNOSIS — Z90710 Acquired absence of both cervix and uterus: Secondary | ICD-10-CM | POA: Insufficient documentation

## 2013-04-25 DIAGNOSIS — Z96659 Presence of unspecified artificial knee joint: Secondary | ICD-10-CM | POA: Insufficient documentation

## 2013-04-25 DIAGNOSIS — Z923 Personal history of irradiation: Secondary | ICD-10-CM | POA: Insufficient documentation

## 2013-04-25 DIAGNOSIS — Z01419 Encounter for gynecological examination (general) (routine) without abnormal findings: Secondary | ICD-10-CM | POA: Insufficient documentation

## 2013-04-25 DIAGNOSIS — C541 Malignant neoplasm of endometrium: Secondary | ICD-10-CM

## 2013-04-25 DIAGNOSIS — Z9079 Acquired absence of other genital organ(s): Secondary | ICD-10-CM | POA: Insufficient documentation

## 2013-04-25 NOTE — Patient Instructions (Signed)
Follow up in 12 months with gyn oncology and 6 months with Dr. Roselind Messier.

## 2013-04-25 NOTE — Progress Notes (Signed)
Consult Note: Gyn-Onc  Raven Marks 57 y.o. female  CC:  Chief Complaint  Patient presents with  . Endo CA    Follow up visit     HPI: HISTORY OF PRESENT ILLNESS: Raven Marks is a 57 year old who had a grade 1 endometrial cancer on biopsy. In January 2012, she underwent robotic hysterectomy, BSO, bilateral pelvic and right para-aortic lymph  node dissection. Final pathology was consistent with a stage IB, grade 2 endometrioid adenocarcinoma. She had grade 2 lesion with 0.7/1.2 cm of myometrial thickness. There was no lymphovascular space involvement,  0/12 lymph nodes were involved. She underwent postoperative radiation therapy under the care of Raven Marks, consisting of HDR. She was last seen by me in 6/13 at which time her exam and Pap smear were negative.   She comes in today for followup. She is overall doing fairly well.   Interval History:  She is scheduled for mammogram in 2 weeks.  Review of Systems She denies any vaginal bleeding. 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    Constitutional:  Denies fever. Skin: No rash, sores, jaundice, itching, or dryness.  Cardiovascular: No chest pain, shortness of breath, or edema  Pulmonary: No cough or wheeze.  Gastro Intestinal: No nausea, vomiting, constipation, or diarrhea reported. No bright red blood per rectum or change in bowel movement.  Genitourinary: No frequency, urgency, or dysuria.  Denies vaginal bleeding and discharge.  Musculoskeletal: No myalgia, arthralgia, joint swelling or pain. She is considering getting a new prosthetic right leg Neurologic: No weakness, numbness, or change in gait.  Psychology: No changes   Current Meds:  Outpatient Encounter Prescriptions as of 04/25/2013  Medication Sig Dispense Refill  . Loratadine (CLARITIN PO) Take 10 mg by mouth.      Marland Kitchen omeprazole (PRILOSEC) 10 MG capsule Take  10 mg by mouth daily.      . Olopatadine HCl (PATADAY) 0.2 % SOLN Apply 1 drop to eye.       No facility-administered encounter medications on file as of 04/25/2013.    Allergy:  Allergies  Allergen Reactions  . Penicillins Rash    Social Hx:   History   Social History  . Marital Status: Married    Spouse Name: N/A    Number of Children: N/A  . Years of Education: N/A   Occupational History  . Not on file.   Social History Main Topics  . Smoking status: Never Smoker   . Smokeless tobacco: Not on file  . Alcohol Use: No  . Drug Use: No  . Sexually Active:    Other Topics Concern  . Not on file   Social History Narrative  . No narrative on file    Past Surgical Hx:  Past Surgical History  Procedure Laterality Date  . Cesarean section    . Replacement total knee      Left  . Abdominal hysterectomy  10/08/2010    Robotic, BSO, bilateral pelvic/ R periaortic LND    Past Medical Hx:  Past Medical History  Diagnosis Date  . GERD (gastroesophageal reflux disease)   . Vertigo     associated with anesthesia  . Endometrioid adenocarcinoma     Stage IA, grade 2    Family Hx:  Family History  Problem Relation Age of Onset  . Colon cancer Mother   . Colon cancer Father  Vitals:  Blood pressure 122/80, pulse 70, temperature 98.7 F (37.1 C), temperature source Oral, resp. rate 16, height 5\' 4"  (1.626 m), weight 180 lb (81.647 kg).  Physical Exam:  GENERAL: Well-nourished, well-developed female, in no acute distress.   NECK: Supple. There is no lymphadenopathy, no thyromegaly.   LUNGS: Clear to auscultation bilaterally.   CARDIOVASCULAR: Regular rate and rhythm.   ABDOMEN: She has well-healed surgical incisions. Abdomen is soft, nontender, nondistended. There are no palpable masses or hepatosplenomegaly. Groins are negative for adenopathy.   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 The left leg is unremarkable.   PELVIC:   The vagina is without lesions. The vaginal cuff is visualized. There are no visible lesions. ThinPrep Pap was submitted without difficulty. Bimanual examination reveals no masses or nodularity. Rectal confirms.   ASSESSMENT:  A 57 year old with a stage IB, grade 2 endometrioid adenocarcinoma, status post appropriate surgery and vaginal cuff brachytherapy. 1. We will follow up on the results of her Pap smear from today.  2. She will see Raven Marks in 6 months and return to see [place] in 12 months.      Raven Marks A., MD 04/25/2013, 4:48 PM

## 2013-04-27 ENCOUNTER — Telehealth: Payer: Self-pay | Admitting: Gynecologic Oncology

## 2013-04-27 NOTE — Telephone Encounter (Signed)
Pt notified about pap results: negative.  No questions or concerns voiced. 

## 2013-05-01 ENCOUNTER — Encounter: Payer: Self-pay | Admitting: Gynecologic Oncology

## 2013-08-02 ENCOUNTER — Other Ambulatory Visit: Payer: Self-pay

## 2013-10-09 ENCOUNTER — Emergency Department (HOSPITAL_COMMUNITY): Payer: BC Managed Care – PPO

## 2013-10-09 ENCOUNTER — Encounter (HOSPITAL_COMMUNITY): Payer: Self-pay | Admitting: Emergency Medicine

## 2013-10-09 ENCOUNTER — Inpatient Hospital Stay (HOSPITAL_COMMUNITY)
Admission: EM | Admit: 2013-10-09 | Discharge: 2013-10-13 | DRG: 417 | Disposition: A | Discharge status: Home / Self Care | Payer: BC Managed Care – PPO | Attending: Surgery | Admitting: Surgery

## 2013-10-09 DIAGNOSIS — K8050 Calculus of bile duct without cholangitis or cholecystitis without obstruction: Secondary | ICD-10-CM | POA: Diagnosis present

## 2013-10-09 DIAGNOSIS — K8020 Calculus of gallbladder without cholecystitis without obstruction: Secondary | ICD-10-CM | POA: Diagnosis present

## 2013-10-09 DIAGNOSIS — R112 Nausea with vomiting, unspecified: Secondary | ICD-10-CM

## 2013-10-09 DIAGNOSIS — K8070 Calculus of gallbladder and bile duct without cholecystitis without obstruction: Principal | ICD-10-CM | POA: Diagnosis present

## 2013-10-09 DIAGNOSIS — K859 Acute pancreatitis without necrosis or infection, unspecified: Secondary | ICD-10-CM | POA: Diagnosis present

## 2013-10-09 DIAGNOSIS — K851 Biliary acute pancreatitis without necrosis or infection: Secondary | ICD-10-CM | POA: Diagnosis present

## 2013-10-09 DIAGNOSIS — K219 Gastro-esophageal reflux disease without esophagitis: Secondary | ICD-10-CM | POA: Diagnosis present

## 2013-10-09 DIAGNOSIS — Z23 Encounter for immunization: Secondary | ICD-10-CM

## 2013-10-09 DIAGNOSIS — Z96659 Presence of unspecified artificial knee joint: Secondary | ICD-10-CM

## 2013-10-09 DIAGNOSIS — R7401 Elevation of levels of liver transaminase levels: Secondary | ICD-10-CM

## 2013-10-09 DIAGNOSIS — Z79899 Other long term (current) drug therapy: Secondary | ICD-10-CM

## 2013-10-09 DIAGNOSIS — R740 Nonspecific elevation of levels of transaminase and lactic acid dehydrogenase [LDH]: Secondary | ICD-10-CM

## 2013-10-09 HISTORY — DX: Other specified postprocedural states: Z98.890

## 2013-10-09 HISTORY — DX: Adverse effect of unspecified anesthetic, initial encounter: T41.45XA

## 2013-10-09 HISTORY — DX: Other specified postprocedural states: R11.2

## 2013-10-09 HISTORY — DX: Other complications of anesthesia, initial encounter: T88.59XA

## 2013-10-09 LAB — CBC WITH DIFFERENTIAL/PLATELET
Basophils Absolute: 0 10*3/uL (ref 0.0–0.1)
Basophils Relative: 0 % (ref 0–1)
Eosinophils Absolute: 0.1 10*3/uL (ref 0.0–0.7)
Eosinophils Relative: 0 % (ref 0–5)
HCT: 40.5 % (ref 36.0–46.0)
Hemoglobin: 14 g/dL (ref 12.0–15.0)
Lymphocytes Relative: 7 % — ABNORMAL LOW (ref 12–46)
Lymphs Abs: 1 10*3/uL (ref 0.7–4.0)
MCH: 30.4 pg (ref 26.0–34.0)
MCHC: 34.6 g/dL (ref 30.0–36.0)
MCV: 87.9 fL (ref 78.0–100.0)
Monocytes Absolute: 0.5 10*3/uL (ref 0.1–1.0)
Monocytes Relative: 4 % (ref 3–12)
Neutro Abs: 12.4 10*3/uL — ABNORMAL HIGH (ref 1.7–7.7)
Neutrophils Relative %: 89 % — ABNORMAL HIGH (ref 43–77)
Platelets: 416 10*3/uL — ABNORMAL HIGH (ref 150–400)
RBC: 4.61 MIL/uL (ref 3.87–5.11)
RDW: 13.2 % (ref 11.5–15.5)
WBC: 13.9 10*3/uL — ABNORMAL HIGH (ref 4.0–10.5)

## 2013-10-09 LAB — COMPREHENSIVE METABOLIC PANEL
ALT: 512 U/L — ABNORMAL HIGH (ref 0–35)
AST: 578 U/L — ABNORMAL HIGH (ref 0–37)
Albumin: 4.1 g/dL (ref 3.5–5.2)
Alkaline Phosphatase: 501 U/L — ABNORMAL HIGH (ref 39–117)
BUN: 10 mg/dL (ref 6–23)
CO2: 26 mEq/L (ref 19–32)
Calcium: 9.7 mg/dL (ref 8.4–10.5)
Chloride: 101 mEq/L (ref 96–112)
Creatinine, Ser: 0.79 mg/dL (ref 0.50–1.10)
GFR calc Af Amer: >90 mL/min (ref >90)
GFR calc non Af Amer: 89 mL/min — ABNORMAL LOW (ref >90)
Glucose, Bld: 131 mg/dL — ABNORMAL HIGH (ref 70–99)
Potassium: 4.6 mEq/L (ref 3.7–5.3)
Sodium: 143 mEq/L (ref 137–147)
Total Bilirubin: 3.4 mg/dL — ABNORMAL HIGH (ref 0.3–1.2)
Total Protein: 8 g/dL (ref 6.0–8.3)

## 2013-10-09 LAB — LIPASE, BLOOD: Lipase: 288 U/L — ABNORMAL HIGH (ref 11–59)

## 2013-10-09 MED ORDER — SODIUM CHLORIDE 0.9 % IV BOLUS (SEPSIS)
1000.0000 mL | Freq: Once | INTRAVENOUS | Status: AC
  Administered 2013-10-09: 1000 mL via INTRAVENOUS

## 2013-10-09 MED ORDER — ONDANSETRON HCL 4 MG/2ML IJ SOLN
4.0000 mg | Freq: Once | INTRAMUSCULAR | Status: AC
  Administered 2013-10-09: 4 mg via INTRAVENOUS
  Filled 2013-10-09: qty 2

## 2013-10-09 MED ORDER — METOCLOPRAMIDE HCL 5 MG/ML IJ SOLN
10.0000 mg | Freq: Once | INTRAMUSCULAR | Status: AC
  Administered 2013-10-09: 10 mg via INTRAVENOUS
  Filled 2013-10-09: qty 2

## 2013-10-09 MED ORDER — FAMOTIDINE IN NACL 20-0.9 MG/50ML-% IV SOLN
20.0000 mg | INTRAVENOUS | Status: AC
  Administered 2013-10-09: 20 mg via INTRAVENOUS
  Filled 2013-10-09: qty 50

## 2013-10-09 MED ORDER — ONDANSETRON 4 MG PO TBDP
ORAL_TABLET | ORAL | Status: AC
  Filled 2013-10-09: qty 2

## 2013-10-09 MED ORDER — ONDANSETRON 4 MG PO TBDP
8.0000 mg | ORAL_TABLET | Freq: Once | ORAL | Status: AC
  Administered 2013-10-09: 8 mg via ORAL

## 2013-10-09 MED ORDER — FENTANYL CITRATE 0.05 MG/ML IJ SOLN
50.0000 ug | Freq: Once | INTRAMUSCULAR | Status: AC
  Administered 2013-10-09: 50 ug via INTRAVENOUS
  Filled 2013-10-09: qty 2

## 2013-10-09 MED ORDER — MECLIZINE HCL 25 MG PO TABS
25.0000 mg | ORAL_TABLET | Freq: Once | ORAL | Status: AC
  Administered 2013-10-09: 25 mg via ORAL
  Filled 2013-10-09: qty 1

## 2013-10-09 NOTE — ED Notes (Signed)
Pt presents to department for evaluation of epigastric pain. Onset last night. Also states several episodes of nausea/vomiting. 4/10 pain upon arrival. History of acid reflux. Denies chest pain. Respirations unlabored. Pt is alert and oriented x4.

## 2013-10-09 NOTE — H&P (Signed)
Raven Marks is an 58 y.o. female.   Chief Complaint: Epigastric abdominal pain, nausea and vomiting HPI: Patient initially developed epigastric abdominal pain on 09/20/2013. It was associated with nausea and vomiting. It resolved spontaneously. She avoided oral intake and that seemed to relieve the symptoms. This happened again on 09/27/2013. Again, she avoided oral intake for 24 hours and it improved. The pain returned today while she was at work. She had worse vomiting which persisted and she came to the emergency department for evaluation. Workup reveals elevated liver function tests and gallstones as well as elevated lipase. I was asked to see her for admission.Pain is improved since she received some pain medication.  Past Medical History  Diagnosis Date  . GERD (gastroesophageal reflux disease)   . Vertigo     associated with anesthesia  . Endometrioid adenocarcinoma     Stage IA, grade 2    Past Surgical History  Procedure Laterality Date  . Cesarean section    . Replacement total knee      Left  . Abdominal hysterectomy  10/08/2010    Robotic, BSO, bilateral pelvic/ R periaortic LND    Family History  Problem Relation Age of Onset  . Colon cancer Mother   . Colon cancer Father    Social History:  reports that she has never smoked. She does not have any smokeless tobacco history on file. She reports that she does not drink alcohol or use illicit drugs.  Allergies:  Allergies  Allergen Reactions  . Codeine   . Penicillins Rash     (Not in a hospital admission)  Results for orders placed during the hospital encounter of 10/09/13 (from the past 48 hour(s))  CBC WITH DIFFERENTIAL     Status: Abnormal   Collection Time    10/09/13  5:23 PM      Result Value Range   WBC 13.9 (*) 4.0 - 10.5 K/uL   RBC 4.61  3.87 - 5.11 MIL/uL   Hemoglobin 14.0  12.0 - 15.0 g/dL   HCT 40.5  36.0 - 46.0 %   MCV 87.9  78.0 - 100.0 fL   MCH 30.4  26.0 - 34.0 pg   MCHC 34.6  30.0 -  36.0 g/dL   RDW 13.2  11.5 - 15.5 %   Platelets 416 (*) 150 - 400 K/uL   Neutrophils Relative % 89 (*) 43 - 77 %   Neutro Abs 12.4 (*) 1.7 - 7.7 K/uL   Lymphocytes Relative 7 (*) 12 - 46 %   Lymphs Abs 1.0  0.7 - 4.0 K/uL   Monocytes Relative 4  3 - 12 %   Monocytes Absolute 0.5  0.1 - 1.0 K/uL   Eosinophils Relative 0  0 - 5 %   Eosinophils Absolute 0.1  0.0 - 0.7 K/uL   Basophils Relative 0  0 - 1 %   Basophils Absolute 0.0  0.0 - 0.1 K/uL  COMPREHENSIVE METABOLIC PANEL     Status: Abnormal   Collection Time    10/09/13  5:23 PM      Result Value Range   Sodium 143  137 - 147 mEq/L   Potassium 4.6  3.7 - 5.3 mEq/L   Chloride 101  96 - 112 mEq/L   CO2 26  19 - 32 mEq/L   Glucose, Bld 131 (*) 70 - 99 mg/dL   BUN 10  6 - 23 mg/dL   Creatinine, Ser 0.79  0.50 - 1.10 mg/dL  Calcium 9.7  8.4 - 10.5 mg/dL   Total Protein 8.0  6.0 - 8.3 g/dL   Albumin 4.1  3.5 - 5.2 g/dL   AST 578 (*) 0 - 37 U/L   ALT 512 (*) 0 - 35 U/L   Alkaline Phosphatase 501 (*) 39 - 117 U/L   Total Bilirubin 3.4 (*) 0.3 - 1.2 mg/dL   GFR calc non Af Amer 89 (*) >90 mL/min   GFR calc Af Amer >90  >90 mL/min   Comment: (NOTE)     The eGFR has been calculated using the CKD EPI equation.     This calculation has not been validated in all clinical situations.     eGFR's persistently <90 mL/min signify possible Chronic Kidney     Disease.  LIPASE, BLOOD     Status: Abnormal   Collection Time    10/09/13  5:23 PM      Result Value Range   Lipase 288 (*) 11 - 59 U/L   US Abdomen Complete  10/09/2013   CLINICAL DATA:  Pancreatitis.  Question gallstones.  EXAM: ULTRASOUND ABDOMEN COMPLETE  COMPARISON:  None.  FINDINGS: Gallbladder:  The gallbladder is contracted. Multiple small stones are present. No pericholecystic fluid is identified and the sonographer reports negative Murphy's sign. Gallbladder wall thickness could not be assessed.  Common bile duct:  Not visualized due to overlying bowel gas.  Liver:  The  liver demonstrates increased echogenicity consistent with fatty infiltration. No focal lesion is identified and there is no intrahepatic biliary ductal dilatation.  IVC:  No abnormality visualized.  Pancreas:  Visualized portion unremarkable.  Spleen:  Size and appearance within normal limits.  Right Kidney:  Length: 10.0 cm. Echogenicity within normal limits. No mass or hydronephrosis visualized.  Left Kidney:  Length: 10.3 cm. Echogenicity within normal limits. No mass or hydronephrosis visualized.  Abdominal aorta:  No aneurysm visualized.  Other findings:  None.  IMPRESSION: Multiple stones are present within a contracted gallbladder. There is no pericholecystic fluid and the sonographer reports a negative Murphy's sign. Wall thickness could not be assessed.  Fatty infiltration of the liver.   Electronically Signed   By: Inge Rise M.D.   On: 10/09/2013 20:40    Review of Systems  Constitutional: Negative.   HENT: Negative.   Eyes: Negative.   Respiratory: Negative.   Cardiovascular: Negative.   Gastrointestinal: Positive for nausea, vomiting and abdominal pain. Negative for diarrhea and constipation.  Genitourinary: Negative.   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   Neurological: Positive for dizziness.  Endo/Heme/Allergies: Negative.   Psychiatric/Behavioral: Negative.     Blood pressure 139/79, pulse 61, temperature 99.6 F (37.6 C), temperature source Oral, resp. rate 13, weight 193 lb 4 oz (87.658 kg), SpO2 98.00%. Physical Exam  Constitutional: She is oriented to person, place, and time. She appears well-developed and well-nourished.  HENT:  Head: Normocephalic and atraumatic.  Mouth/Throat: Oropharynx is clear and moist.  Eyes: EOM are normal. Pupils are equal, round, and reactive to light.  Neck: Normal range of motion. Neck supple. No tracheal deviation present.  Cardiovascular: Normal rate, regular  rhythm, normal heart sounds and intact distal pulses.   Respiratory: Effort normal and breath sounds normal. No stridor. No respiratory distress. She has no wheezes. She has no rales.  GI: Soft. She exhibits no distension. There is tenderness. There is no rebound and no guarding.  Mild epigastric abdominal tenderness, no guarding, no generalized tenderness, no significant right upper quadrant tenderness  Musculoskeletal:  Prosthetic right lower extremity, scar left knee status post knee replacement  Neurological: She is alert and oriented to person, place, and time. She exhibits normal muscle tone.  Skin: Skin is warm.  Psychiatric: She has a normal mood and affect.     Assessment/Plan Biliary pancreatitis: Will admit to the hospital, continue bowel rest, begin IV fluids and IV antibiotics, and followup laboratory studies in the morning. If her bilirubin and transaminases remain high, she may need ERCP. She is a patient of Dr. Cristina Gong from [facility] gastroenterology. Once her pancreatitis resolves, we'll plan laparoscopic cholecystectomy. Plan was discussed in detail the patient and her husband. I answered their questions.  Isac Lincks E 10/09/2013, 9:56 PM

## 2013-10-09 NOTE — ED Provider Notes (Signed)
CSN: [id number]     Arrival date & time 10/09/13  1701 History   First MD Initiated Contact with Patient 10/09/13 1807     Chief Complaint  Patient presents with  . Abdominal Pain   (Consider location/radiation/quality/duration/timing/severity/associated sxs/prior Treatment) Patient is a 58 y.o. female presenting with abdominal pain. The history is provided by the patient and medical records.  Abdominal Pain Associated symptoms: nausea and vomiting    This is a 58 year old female with past medical history significant for GERD, and vertigo, presenting to the ED for burning epigastric abdominal pain, onset last night. Patient does have multiple episodes of associated nausea and nonbloody, nonbilious emesis. Symptoms do not seem related to food intake. Pt has had multiple flares of similar sx, last occuring on January 1st when she thought she had the "stomach flu".  Pt states daily prilosec for her GERD but states it does not seem to be controlling her sx as of lately.  No prior hx of gastric ulcers.  Denies any chest pain, SOB, or palpitations.  No prior cardiac hx.  Unknown fevers at home-- temp 99.33F on arrival.  Past Medical History  Diagnosis Date  . GERD (gastroesophageal reflux disease)   . Vertigo     associated with anesthesia  . Endometrioid adenocarcinoma     Stage IA, grade 2   Past Surgical History  Procedure Laterality Date  . Cesarean section    . Replacement total knee      Left  . Abdominal hysterectomy  10/08/2010    Robotic, BSO, bilateral pelvic/ R periaortic LND   Family History  Problem Relation Age of Onset  . Colon cancer Mother   . Colon cancer Father    History  Substance Use Topics  . Smoking status: Never Smoker   . Smokeless tobacco: Not on file  . Alcohol Use: No   OB History   Grav Para Term Preterm Abortions TAB SAB Ect Mult Living                 Review of Systems  Gastrointestinal: Positive for nausea, vomiting and abdominal pain.  All  other systems reviewed and are negative.    Allergies  Codeine and Penicillins  Home Medications   Current Outpatient Rx  Name  Route  Sig  Dispense  Refill  . Loratadine (CLARITIN PO)   Oral   Take 10 mg by mouth.         . Olopatadine HCl (PATADAY) 0.2 % SOLN   Ophthalmic   Apply 1 drop to eye.         Marland Kitchen omeprazole (PRILOSEC) 10 MG capsule   Oral   Take 10 mg by mouth daily.          BP 125/88  Pulse 71  Temp(Src) 99.6 F (37.6 C) (Oral)  Resp 20  Wt 193 lb 4 oz (87.658 kg)  SpO2 100%  Physical Exam  Nursing note and vitals reviewed. Constitutional: She is oriented to person, place, and time. She appears well-developed and well-nourished.  HENT:  Head: Normocephalic and atraumatic.  Mouth/Throat: Oropharynx is clear and moist.  Eyes: Conjunctivae and EOM are normal. Pupils are equal, round, and reactive to light.  Neck: Normal range of motion.  Cardiovascular: Normal rate, regular rhythm and normal heart sounds.   Pulmonary/Chest: Effort normal and breath sounds normal. No respiratory distress. She has no wheezes.  Abdominal: Soft. Bowel sounds are normal. There is tenderness in the epigastric area and left upper quadrant.  There is no rigidity, no guarding, no tenderness at McBurney's point and negative Murphy's sign.  Musculoskeletal: Normal range of motion.  Neurological: She is alert and oriented to person, place, and time.  Skin: Skin is warm and dry.  Psychiatric: She has a normal mood and affect.    ED Course  Procedures (including critical care time) Labs Review Labs Reviewed  CBC WITH DIFFERENTIAL - Abnormal; Notable for the following:    WBC 13.9 (*)    Platelets 416 (*)    Neutrophils Relative % 89 (*)    Neutro Abs 12.4 (*)    Lymphocytes Relative 7 (*)    All other components within normal limits  COMPREHENSIVE METABOLIC PANEL - Abnormal; Notable for the following:    Glucose, Bld 131 (*)    AST 578 (*)    ALT 512 (*)    Alkaline  Phosphatase 501 (*)    Total Bilirubin 3.4 (*)    GFR calc non Af Amer 89 (*)    All other components within normal limits  LIPASE, BLOOD - Abnormal; Notable for the following:    Lipase 288 (*)    All other components within normal limits   Imaging Review US Abdomen Complete  10/09/2013   CLINICAL DATA:  Pancreatitis.  Question gallstones.  EXAM: ULTRASOUND ABDOMEN COMPLETE  COMPARISON:  None.  FINDINGS: Gallbladder:  The gallbladder is contracted. Multiple small stones are present. No pericholecystic fluid is identified and the sonographer reports negative Murphy's sign. Gallbladder wall thickness could not be assessed.  Common bile duct:  Not visualized due to overlying bowel gas.  Liver:  The liver demonstrates increased echogenicity consistent with fatty infiltration. No focal lesion is identified and there is no intrahepatic biliary ductal dilatation.  IVC:  No abnormality visualized.  Pancreas:  Visualized portion unremarkable.  Spleen:  Size and appearance within normal limits.  Right Kidney:  Length: 10.0 cm. Echogenicity within normal limits. No mass or hydronephrosis visualized.  Left Kidney:  Length: 10.3 cm. Echogenicity within normal limits. No mass or hydronephrosis visualized.  Abdominal aorta:  No aneurysm visualized.  Other findings:  None.  IMPRESSION: Multiple stones are present within a contracted gallbladder. There is no pericholecystic fluid and the sonographer reports a negative Murphy's sign. Wall thickness could not be assessed.  Fatty infiltration of the liver.   Electronically Signed   By: Inge Rise M.D.   On: 10/09/2013 20:40    EKG Interpretation   None       MDM   1. Gallstone pancreatitis   2. Nausea & vomiting   3. Transaminitis    Labs as above-- transaminitis present along with elevations of alk phos and lipase consistent with acute pancreatitis.  Pt denies EtOH use or prior hx of gallstones.  Will obtain abdominal u/s for further evaluation.  Pt  given antiemetics, pepcid, and fentanyl.  Will be kept NPO.  Ultrasound revealing multiple gallstones within contracted gallbladder.  Consulted general surgery, Dr. Grandville Silos, will admit pt for gallstone pancreatitis.  Pain and nausea well controlled in the ED.  VS have remained stable.  Larene Pickett, PA-C 10/10/13 [id number]

## 2013-10-10 LAB — COMPREHENSIVE METABOLIC PANEL
ALK PHOS: 395 U/L — AB (ref 39–117)
ALT: 368 U/L — AB (ref 0–35)
AST: 283 U/L — ABNORMAL HIGH (ref 0–37)
Albumin: 3.3 g/dL — ABNORMAL LOW (ref 3.5–5.2)
BUN: 13 mg/dL (ref 6–23)
CHLORIDE: 106 meq/L (ref 96–112)
CO2: 22 mEq/L (ref 19–32)
Calcium: 8.7 mg/dL (ref 8.4–10.5)
Creatinine, Ser: 0.89 mg/dL (ref 0.50–1.10)
GFR calc non Af Amer: 70 mL/min — ABNORMAL LOW (ref >90)
GFR, EST AFRICAN AMERICAN: 81 mL/min — AB (ref >90)
GLUCOSE: 104 mg/dL — AB (ref 70–99)
POTASSIUM: 4.6 meq/L (ref 3.7–5.3)
Sodium: 143 mEq/L (ref 137–147)
TOTAL PROTEIN: 6.6 g/dL (ref 6.0–8.3)
Total Bilirubin: 1 mg/dL (ref 0.3–1.2)

## 2013-10-10 LAB — AMYLASE: AMYLASE: 64 U/L (ref 0–105)

## 2013-10-10 LAB — CBC
HCT: 35.7 % — ABNORMAL LOW (ref 36.0–46.0)
Hemoglobin: 11.7 g/dL — ABNORMAL LOW (ref 12.0–15.0)
MCH: 29.8 pg (ref 26.0–34.0)
MCHC: 32.8 g/dL (ref 30.0–36.0)
MCV: 91.1 fL (ref 78.0–100.0)
Platelets: 342 10*3/uL (ref 150–400)
RBC: 3.92 MIL/uL (ref 3.87–5.11)
RDW: 13.6 % (ref 11.5–15.5)
WBC: 11 10*3/uL — ABNORMAL HIGH (ref 4.0–10.5)

## 2013-10-10 LAB — LIPASE, BLOOD: Lipase: 89 U/L — ABNORMAL HIGH (ref 11–59)

## 2013-10-10 MED ORDER — MECLIZINE HCL 12.5 MG PO TABS
12.5000 mg | ORAL_TABLET | Freq: Three times a day (TID) | ORAL | Status: DC | PRN
  Administered 2013-10-10: 12.5 mg via ORAL
  Filled 2013-10-10 (×3): qty 1

## 2013-10-10 MED ORDER — MORPHINE SULFATE 2 MG/ML IJ SOLN
2.0000 mg | INTRAMUSCULAR | Status: DC | PRN
  Filled 2013-10-10: qty 1

## 2013-10-10 MED ORDER — OLOPATADINE HCL 0.1 % OP SOLN
1.0000 [drp] | Freq: Two times a day (BID) | OPHTHALMIC | Status: DC
  Filled 2013-10-10: qty 5

## 2013-10-10 MED ORDER — CIPROFLOXACIN IN D5W 400 MG/200ML IV SOLN
400.0000 mg | Freq: Two times a day (BID) | INTRAVENOUS | Status: DC
  Administered 2013-10-10 – 2013-10-13 (×7): 400 mg via INTRAVENOUS
  Filled 2013-10-10 (×9): qty 200

## 2013-10-10 MED ORDER — KCL IN DEXTROSE-NACL 20-5-0.45 MEQ/L-%-% IV SOLN
INTRAVENOUS | Status: DC
  Administered 2013-10-10 – 2013-10-13 (×3): via INTRAVENOUS
  Filled 2013-10-10 (×11): qty 1000

## 2013-10-10 MED ORDER — DIPHENHYDRAMINE HCL 12.5 MG/5ML PO ELIX
12.5000 mg | ORAL_SOLUTION | Freq: Four times a day (QID) | ORAL | Status: DC | PRN
  Filled 2013-10-10: qty 10

## 2013-10-10 MED ORDER — HYDROMORPHONE HCL PF 1 MG/ML IJ SOLN
1.0000 mg | Freq: Once | INTRAMUSCULAR | Status: AC
  Administered 2013-10-10: 1 mg via INTRAVENOUS
  Filled 2013-10-10: qty 1

## 2013-10-10 MED ORDER — ONDANSETRON HCL 4 MG/2ML IJ SOLN
4.0000 mg | Freq: Four times a day (QID) | INTRAMUSCULAR | Status: DC | PRN
  Administered 2013-10-10: 20:00:00 4 mg via INTRAVENOUS
  Filled 2013-10-10: qty 2

## 2013-10-10 MED ORDER — INFLUENZA VAC SPLIT QUAD 0.5 ML IM SUSP
0.5000 mL | INTRAMUSCULAR | Status: DC
  Filled 2013-10-10: qty 0.5

## 2013-10-10 MED ORDER — ACETAMINOPHEN 325 MG PO TABS
650.0000 mg | ORAL_TABLET | Freq: Four times a day (QID) | ORAL | Status: DC | PRN
  Administered 2013-10-10 – 2013-10-13 (×8): 650 mg via ORAL
  Filled 2013-10-10 (×8): qty 2

## 2013-10-10 MED ORDER — DIPHENHYDRAMINE HCL 50 MG/ML IJ SOLN: 12.5000 mg | Freq: Four times a day (QID) | INTRAMUSCULAR | Status: DC | PRN

## 2013-10-10 MED ORDER — LORATADINE 10 MG PO TABS
10.0000 mg | ORAL_TABLET | Freq: Every day | ORAL | Status: DC
  Administered 2013-10-10 – 2013-10-13 (×2): 10 mg via ORAL
  Filled 2013-10-10 (×4): qty 1

## 2013-10-10 MED ORDER — PANTOPRAZOLE SODIUM 40 MG PO TBEC
40.0000 mg | DELAYED_RELEASE_TABLET | Freq: Every day | ORAL | Status: DC
  Administered 2013-10-10 – 2013-10-13 (×4): 40 mg via ORAL
  Filled 2013-10-10 (×4): qty 1

## 2013-10-10 NOTE — Progress Notes (Signed)
OR Thursday as long as pancreatitis better.

## 2013-10-10 NOTE — Progress Notes (Signed)
Patient ID: Raven Marks, female   DOB: [birth year], 57 y.o.   MRN: [id number]    Subjective: Pt feels much better this morning.  No pain.  No nausea  Objective: Vital signs in last 24 hours: Temp:  [98 F (36.7 C)-99.6 F (37.6 C)] 98 F (36.7 C) (01/14 0712) Pulse Rate:  [61-87] 67 (01/14 0712) Resp:  [13-20] 15 (01/14 0712) BP: (113-139)/(52-88) 134/82 mmHg (01/14 0712) SpO2:  [94 %-100 %] 95 % (01/14 0712) Weight:  [160 lb 0.9 oz (72.6 kg)-193 lb 4 oz (87.658 kg)] 160 lb 0.9 oz (72.6 kg) (01/14 0035) Last BM Date: 10/08/13  Intake/Output from previous day:   Intake/Output this shift:    PE: Abd: soft, NT, ND, +BS Heart: regular Lungs: CTAB  Lab Results:   Recent Labs  10/09/13 1723 10/10/13 0545  WBC 13.9* 11.0*  HGB 14.0 11.7*  HCT 40.5 35.7*  PLT 416* 342   BMET  Recent Labs  10/09/13 1723 10/10/13 0545  NA 143 143  K 4.6 4.6  CL 101 106  CO2 26 22  GLUCOSE 131* 104*  BUN 10 13  CREATININE 0.79 0.89  CALCIUM 9.7 8.7   PT/INR No results found for this basename: LABPROT, INR,  in the last 72 hours CMP     Component Value Date/Time   NA 143 10/10/2013 0545   K 4.6 10/10/2013 0545   CL 106 10/10/2013 0545   CO2 22 10/10/2013 0545   GLUCOSE 104* 10/10/2013 0545   BUN 13 10/10/2013 0545   CREATININE 0.89 10/10/2013 0545   CALCIUM 8.7 10/10/2013 0545   PROT 6.6 10/10/2013 0545   ALBUMIN 3.3* 10/10/2013 0545   AST 283* 10/10/2013 0545   ALT 368* 10/10/2013 0545   ALKPHOS 395* 10/10/2013 0545   BILITOT 1.0 10/10/2013 0545   GFRNONAA 70* 10/10/2013 0545   GFRAA 81* 10/10/2013 0545   Lipase     Component Value Date/Time   LIPASE 89* 10/10/2013 0545       Studies/Results: US Abdomen Complete  10/09/2013   CLINICAL DATA:  Pancreatitis.  Question gallstones.  EXAM: ULTRASOUND ABDOMEN COMPLETE  COMPARISON:  None.  FINDINGS: Gallbladder:  The gallbladder is contracted. Multiple small stones are present. No pericholecystic fluid is identified and the  sonographer reports negative Murphy's sign. Gallbladder wall thickness could not be assessed.  Common bile duct:  Not visualized due to overlying bowel gas.  Liver:  The liver demonstrates increased echogenicity consistent with fatty infiltration. No focal lesion is identified and there is no intrahepatic biliary ductal dilatation.  IVC:  No abnormality visualized.  Pancreas:  Visualized portion unremarkable.  Spleen:  Size and appearance within normal limits.  Right Kidney:  Length: 10.0 cm. Echogenicity within normal limits. No mass or hydronephrosis visualized.  Left Kidney:  Length: 10.3 cm. Echogenicity within normal limits. No mass or hydronephrosis visualized.  Abdominal aorta:  No aneurysm visualized.  Other findings:  None.  IMPRESSION: Multiple stones are present within a contracted gallbladder. There is no pericholecystic fluid and the sonographer reports a negative Murphy's sign. Wall thickness could not be assessed.  Fatty infiltration of the liver.   Electronically Signed   By: Inge Rise M.D.   On: 10/09/2013 20:40    Anti-infectives: Anti-infectives   Start     Dose/Rate Route Frequency Ordered Stop   10/10/13 0100  ciprofloxacin (CIPRO) IVPB 400 mg     400 mg 200 mL/hr over 60 Minutes Intravenous Every 12 hours 10/10/13 0025  Assessment/Plan  1. Gallstone pancreatitis 2. H/o endometrial ca  Plan: 1. Will cont to allow pancreatitis to improve today.  She may have some clear liquids, but NPO p MN with plans for lap chole in the am. 2. Her labs are trending down.  Suspect she passed a stone.     LOS: 1 day    Toryn Dewalt E 10/10/2013, 8:22 AM Pager: [phone]

## 2013-10-10 NOTE — Progress Notes (Signed)
Pt reports abdominal pain, only has 2-4mg  of morphine ordered. Pt states that morphine causes her to vomit and she does not want to take it. Paged [facility] on call, spoke to Dr. Lyman Bishop. New orders given. Will administer pain med and continue to monitor pt and pain levels.

## 2013-10-10 NOTE — Progress Notes (Signed)
NURSING PROGRESS NOTE  TENNELLE TAFLINGER [id number] Admission Data: 10/10/2013 0043 Attending Provider: Nolon Nations, MD PCP:No primary provider on file. Code Status: Full  KYARA BOXER is a 58 y.o. female patient admitted from ED:  -No acute distress noted.  -No complaints of shortness of breath.  -No complaints of chest pain.    Blood pressure 114/82, pulse 76, temperature 98 F (36.7 C), temperature source Oral, resp. rate 14, height 5\' 4"  (1.626 m), weight 72.6 kg (160 lb 0.9 oz), SpO2 95.00%.   IV Fluids:  IV in place, occlusive dsg intact without redness, IV cath antecubital right, condition patent and no redness normal saline.   Allergies:  Ambien; Codeine; and Penicillins  Past Medical History:   has a past medical history of GERD (gastroesophageal reflux disease); Vertigo; and Endometrioid adenocarcinoma.  Past Surgical History:   has past surgical history that includes Cesarean section; Replacement total knee; and Abdominal hysterectomy (10/08/2010).  Social History:   reports that she has never smoked. She does not have any smokeless tobacco history on file. She reports that she does not drink alcohol or use illicit drugs.  Skin: WDL  Patient/Family oriented to room. Information packet given to patient/family. Admission inpatient armband information verified with patient/family to include name and date of birth and placed on patient arm. Side rails up x 2, fall assessment and education completed with patient/family. Patient/family able to verbalize understanding of risk associated with falls and verbalized understanding to call for assistance before getting out of bed. Call light within reach. Patient/family able to voice and demonstrate understanding of unit orientation instructions.

## 2013-10-10 NOTE — Care Management Note (Unsigned)
    Page 1 of 1   10/10/2013     6:29:11 PM   CARE MANAGEMENT NOTE 10/10/2013  Patient:  Raven Marks, Raven Marks   Account Number:  [id number]  Date Initiated:  10/10/2013  Documentation initiated by:  Tomi Bamberger  Subjective/Objective Assessment:   dx acute biliary pancreatitis  admit- lives with spouse     Action/Plan:   Anticipated DC Date:  10/12/2013   Anticipated DC Plan:  [place]  CM consult      Choice offered to / List presented to:             Status of service:  In process, will continue to follow Medicare Important Message given?   (If response is "NO", the following Medicare IM given date fields will be blank) Date Medicare IM given:   Date Additional Medicare IM given:    Discharge Disposition:    Per UR Regulation:  Reviewed for med. necessity/level of care/duration of stay  If discussed at [place] of Stay Meetings, dates discussed:    Comments:  10/10/13 Tomi Bamberger RN ,BSN [phone] patient lives with spouse, NCM will continue to follow for dc needs.

## 2013-10-11 ENCOUNTER — Inpatient Hospital Stay (HOSPITAL_COMMUNITY): Payer: BC Managed Care – PPO | Admitting: Anesthesiology

## 2013-10-11 ENCOUNTER — Inpatient Hospital Stay (HOSPITAL_COMMUNITY): Payer: BC Managed Care – PPO

## 2013-10-11 ENCOUNTER — Encounter (HOSPITAL_COMMUNITY): Payer: BC Managed Care – PPO | Admitting: Anesthesiology

## 2013-10-11 ENCOUNTER — Encounter (HOSPITAL_COMMUNITY): Admission: EM | Disposition: A | Discharge status: Home / Self Care | Payer: Self-pay | Source: Home / Self Care

## 2013-10-11 DIAGNOSIS — K8010 Calculus of gallbladder with chronic cholecystitis without obstruction: Secondary | ICD-10-CM

## 2013-10-11 HISTORY — PX: CHOLECYSTECTOMY: SHX55

## 2013-10-11 LAB — CBC
HCT: 36.9 % (ref 36.0–46.0)
Hemoglobin: 11.9 g/dL — ABNORMAL LOW (ref 12.0–15.0)
MCH: 29.8 pg (ref 26.0–34.0)
MCHC: 32.2 g/dL (ref 30.0–36.0)
MCV: 92.3 fL (ref 78.0–100.0)
Platelets: 336 10*3/uL (ref 150–400)
RBC: 4 MIL/uL (ref 3.87–5.11)
RDW: 13.4 % (ref 11.5–15.5)
WBC: 12.6 10*3/uL — ABNORMAL HIGH (ref 4.0–10.5)

## 2013-10-11 LAB — COMPREHENSIVE METABOLIC PANEL WITH GFR
ALT: 269 U/L — ABNORMAL HIGH (ref 0–35)
AST: 118 U/L — ABNORMAL HIGH (ref 0–37)
Albumin: 3.3 g/dL — ABNORMAL LOW (ref 3.5–5.2)
Alkaline Phosphatase: 360 U/L — ABNORMAL HIGH (ref 39–117)
BUN: 7 mg/dL (ref 6–23)
CO2: 23 meq/L (ref 19–32)
Calcium: 9 mg/dL (ref 8.4–10.5)
Chloride: 105 meq/L (ref 96–112)
Creatinine, Ser: 0.81 mg/dL (ref 0.50–1.10)
GFR calc Af Amer: >90 mL/min
GFR calc non Af Amer: 78 mL/min — ABNORMAL LOW
Glucose, Bld: 115 mg/dL — ABNORMAL HIGH (ref 70–99)
Potassium: 5 meq/L (ref 3.7–5.3)
Sodium: 141 meq/L (ref 137–147)
Total Bilirubin: 0.8 mg/dL (ref 0.3–1.2)
Total Protein: 7 g/dL (ref 6.0–8.3)

## 2013-10-11 LAB — LIPASE, BLOOD: Lipase: 53 U/L (ref 11–59)

## 2013-10-11 LAB — GLUCOSE, CAPILLARY: GLUCOSE-CAPILLARY: 90 mg/dL (ref 70–99)

## 2013-10-11 SURGERY — LAPAROSCOPIC CHOLECYSTECTOMY WITH INTRAOPERATIVE CHOLANGIOGRAM
Anesthesia: General | Site: Abdomen

## 2013-10-11 MED ORDER — PROMETHAZINE HCL 25 MG/ML IJ SOLN
12.5000 mg | Freq: Four times a day (QID) | INTRAMUSCULAR | Status: DC | PRN
  Administered 2013-10-11: 17:00:00 12.5 mg via INTRAVENOUS
  Filled 2013-10-11: qty 1

## 2013-10-11 MED ORDER — FENTANYL CITRATE 0.05 MG/ML IJ SOLN
INTRAMUSCULAR | Status: DC | PRN
  Administered 2013-10-11 (×2): 100 ug via INTRAVENOUS
  Administered 2013-10-11: 50 ug via INTRAVENOUS

## 2013-10-11 MED ORDER — ONDANSETRON HCL 4 MG/2ML IJ SOLN: 4.0000 mg | Freq: Once | INTRAMUSCULAR | Status: AC | PRN

## 2013-10-11 MED ORDER — MECLIZINE HCL 25 MG PO TABS
25.0000 mg | ORAL_TABLET | ORAL | Status: AC
  Administered 2013-10-11: 25 mg via ORAL
  Filled 2013-10-11: qty 1

## 2013-10-11 MED ORDER — FENTANYL CITRATE 0.05 MG/ML IJ SOLN: 25.0000 ug | INTRAMUSCULAR | Status: DC | PRN

## 2013-10-11 MED ORDER — SODIUM CHLORIDE 0.9 % IV SOLN: INTRAVENOUS | Status: DC

## 2013-10-11 MED ORDER — LACTATED RINGERS IV SOLN
INTRAVENOUS | Status: DC | PRN
  Administered 2013-10-11 (×2): via INTRAVENOUS

## 2013-10-11 MED ORDER — BUPIVACAINE-EPINEPHRINE 0.25% -1:200000 IJ SOLN
INTRAMUSCULAR | Status: DC | PRN
  Administered 2013-10-11: 6 mL

## 2013-10-11 MED ORDER — PROPOFOL 10 MG/ML IV BOLUS
INTRAVENOUS | Status: DC | PRN
  Administered 2013-10-11: 200 mg via INTRAVENOUS

## 2013-10-11 MED ORDER — SODIUM CHLORIDE 0.9 % IV SOLN
INTRAVENOUS | Status: DC
  Administered 2013-10-11: 18:00:00 via INTRAVENOUS

## 2013-10-11 MED ORDER — SODIUM CHLORIDE 0.9 % IV SOLN
INTRAVENOUS | Status: DC | PRN
  Administered 2013-10-11: 11:00:00

## 2013-10-11 MED ORDER — SODIUM CHLORIDE 0.9 % IR SOLN
Status: DC | PRN
  Administered 2013-10-11: 1000 mL

## 2013-10-11 MED ORDER — LIDOCAINE HCL (CARDIAC) 20 MG/ML IV SOLN
INTRAVENOUS | Status: DC | PRN
  Administered 2013-10-11: 180 mg via INTRAVENOUS

## 2013-10-11 MED ORDER — NEOSTIGMINE METHYLSULFATE 1 MG/ML IJ SOLN
INTRAMUSCULAR | Status: DC | PRN
  Administered 2013-10-11: 4 mg via INTRAVENOUS

## 2013-10-11 MED ORDER — MECLIZINE HCL 25 MG PO TABS
25.0000 mg | ORAL_TABLET | Freq: Three times a day (TID) | ORAL | Status: DC | PRN
  Administered 2013-10-11 – 2013-10-12 (×2): 25 mg via ORAL
  Filled 2013-10-11 (×2): qty 1

## 2013-10-11 MED ORDER — MECLIZINE HCL 12.5 MG PO TABS
12.5000 mg | ORAL_TABLET | ORAL | Status: AC
  Administered 2013-10-11: 02:00:00 12.5 mg via ORAL
  Filled 2013-10-11: qty 1

## 2013-10-11 MED ORDER — BUPIVACAINE-EPINEPHRINE (PF) 0.25% -1:200000 IJ SOLN
INTRAMUSCULAR | Status: AC
  Filled 2013-10-11: qty 30

## 2013-10-11 MED ORDER — ROCURONIUM BROMIDE 100 MG/10ML IV SOLN
INTRAVENOUS | Status: DC | PRN
  Administered 2013-10-11: 40 mg via INTRAVENOUS

## 2013-10-11 MED ORDER — HYDROCODONE-ACETAMINOPHEN 5-325 MG PO TABS: 1.0000 | ORAL_TABLET | ORAL | Status: DC | PRN

## 2013-10-11 MED ORDER — 0.9 % SODIUM CHLORIDE (POUR BTL) OPTIME
TOPICAL | Status: DC | PRN
  Administered 2013-10-11: 1000 mL

## 2013-10-11 MED ORDER — GLYCOPYRROLATE 0.2 MG/ML IJ SOLN
INTRAMUSCULAR | Status: DC | PRN
  Administered 2013-10-11: .6 mg via INTRAVENOUS

## 2013-10-11 MED ORDER — HEMOSTATIC AGENTS (NO CHARGE) OPTIME
TOPICAL | Status: DC | PRN
  Administered 2013-10-11: 1 via TOPICAL

## 2013-10-11 MED ORDER — MECLIZINE HCL 12.5 MG PO TABS: 12.5000 mg | ORAL_TABLET | Freq: Once | ORAL | Status: DC

## 2013-10-11 MED ORDER — ONDANSETRON HCL 4 MG/2ML IJ SOLN
INTRAMUSCULAR | Status: DC | PRN
  Administered 2013-10-11 (×2): 4 mg via INTRAVENOUS

## 2013-10-11 MED ORDER — KETOROLAC TROMETHAMINE 15 MG/ML IJ SOLN
15.0000 mg | Freq: Four times a day (QID) | INTRAMUSCULAR | Status: DC | PRN
  Filled 2013-10-11: qty 1

## 2013-10-11 MED ORDER — LACTATED RINGERS IV SOLN
INTRAVENOUS | Status: DC
  Administered 2013-10-11: 10:00:00 via INTRAVENOUS

## 2013-10-11 MED ORDER — ONDANSETRON HCL 4 MG/2ML IJ SOLN
4.0000 mg | INTRAMUSCULAR | Status: DC | PRN
  Administered 2013-10-11: 14:00:00 4 mg via INTRAVENOUS
  Filled 2013-10-11: qty 2

## 2013-10-11 SURGICAL SUPPLY — 48 items
ADH SKN CLS APL DERMABOND .7 (GAUZE/BANDAGES/DRESSINGS) ×1
APPLIER CLIP ROT 10 11.4 M/L (STAPLE) ×3
APR CLP MED LRG 11.4X10 (STAPLE) ×1
BAG SPEC RTRVL LRG 6X4 10 (ENDOMECHANICALS) ×1
BLADE SURG ROTATE 9660 (MISCELLANEOUS) IMPLANT
CANISTER SUCTION 2500CC (MISCELLANEOUS) ×3 IMPLANT
CHLORAPREP W/TINT 26ML (MISCELLANEOUS) ×3 IMPLANT
CLIP APPLIE ROT 10 11.4 M/L (STAPLE) ×1 IMPLANT
COVER MAYO STAND STRL (DRAPES) ×3 IMPLANT
COVER SURGICAL LIGHT HANDLE (MISCELLANEOUS) ×3 IMPLANT
DECANTER SPIKE VIAL GLASS SM (MISCELLANEOUS) ×6 IMPLANT
DERMABOND ADVANCED (GAUZE/BANDAGES/DRESSINGS) ×2
DERMABOND ADVANCED .7 DNX12 (GAUZE/BANDAGES/DRESSINGS) ×1 IMPLANT
DRAPE C-ARM 42X72 X-RAY (DRAPES) ×3 IMPLANT
DRAPE UTILITY 15X26 W/TAPE STR (DRAPE) ×6 IMPLANT
DRAPE WARM FLUID 44X44 (DRAPE) ×3 IMPLANT
ELECT REM PT RETURN 9FT ADLT (ELECTROSURGICAL) ×3
ELECTRODE REM PT RTRN 9FT ADLT (ELECTROSURGICAL) ×1 IMPLANT
GLOVE BIO SURGEON STRL SZ7.5 (GLOVE) ×2 IMPLANT
GLOVE BIO SURGEON STRL SZ8 (GLOVE) ×3 IMPLANT
GLOVE BIOGEL PI IND STRL 7.0 (GLOVE) IMPLANT
GLOVE BIOGEL PI IND STRL 7.5 (GLOVE) IMPLANT
GLOVE BIOGEL PI IND STRL 8 (GLOVE) ×1 IMPLANT
GLOVE BIOGEL PI INDICATOR 7.0 (GLOVE) ×2
GLOVE BIOGEL PI INDICATOR 7.5 (GLOVE) ×2
GLOVE BIOGEL PI INDICATOR 8 (GLOVE) ×2
GLOVE SURG SS PI 7.0 STRL IVOR (GLOVE) ×2 IMPLANT
GOWN STRL NON-REIN LRG LVL3 (GOWN DISPOSABLE) ×12 IMPLANT
GOWN STRL REIN XL XLG (GOWN DISPOSABLE) ×3 IMPLANT
HEMOSTAT SNOW SURGICEL 2X4 (HEMOSTASIS) ×2 IMPLANT
KIT BASIN OR (CUSTOM PROCEDURE TRAY) ×3 IMPLANT
KIT ROOM TURNOVER OR (KITS) ×3 IMPLANT
NS IRRIG 1000ML POUR BTL (IV SOLUTION) ×6 IMPLANT
PAD ARMBOARD 7.5X6 YLW CONV (MISCELLANEOUS) ×3 IMPLANT
POUCH SPECIMEN RETRIEVAL 10MM (ENDOMECHANICALS) ×3 IMPLANT
SCISSORS LAP 5X35 DISP (ENDOMECHANICALS) ×3 IMPLANT
SET CHOLANGIOGRAPH 5 50 .035 (SET/KITS/TRAYS/PACK) ×3 IMPLANT
SET IRRIG TUBING LAPAROSCOPIC (IRRIGATION / IRRIGATOR) ×3 IMPLANT
SLEEVE ENDOPATH XCEL 5M (ENDOMECHANICALS) ×3 IMPLANT
SPECIMEN JAR SMALL (MISCELLANEOUS) ×3 IMPLANT
SUT MNCRL AB 4-0 PS2 18 (SUTURE) ×3 IMPLANT
TOWEL OR 17X24 6PK STRL BLUE (TOWEL DISPOSABLE) ×3 IMPLANT
TOWEL OR 17X26 10 PK STRL BLUE (TOWEL DISPOSABLE) ×3 IMPLANT
TOWEL OR NON WOVEN STRL DISP B (DISPOSABLE) ×2 IMPLANT
TRAY LAPAROSCOPIC (CUSTOM PROCEDURE TRAY) ×3 IMPLANT
TROCAR XCEL BLUNT TIP 100MML (ENDOMECHANICALS) ×3 IMPLANT
TROCAR XCEL NON-BLD 11X100MML (ENDOMECHANICALS) ×3 IMPLANT
TROCAR XCEL NON-BLD 5MMX100MML (ENDOMECHANICALS) ×3 IMPLANT

## 2013-10-11 NOTE — Preoperative (Signed)
Beta Blockers   Reason not to administer Beta Blockers:Not Applicable 

## 2013-10-11 NOTE — Anesthesia Postprocedure Evaluation (Signed)
  Anesthesia Post-op Note  Patient: Raven Marks  Procedure(s) Performed: Procedure(s): LAPAROSCOPIC CHOLECYSTECTOMY WITH INTRAOPERATIVE CHOLANGIOGRAM (N/A)  Patient Location: PACU  Anesthesia Type:General  Level of Consciousness: awake, oriented, sedated and patient cooperative  Airway and Oxygen Therapy: Patient Spontanous Breathing  Post-op Pain: mild  Post-op Assessment: Post-op Vital signs reviewed, Patient's Cardiovascular Status Stable, Respiratory Function Stable, Patent Airway, No signs of Nausea or vomiting and Pain level controlled  Post-op Vital Signs: stable  Complications: No apparent anesthesia complications

## 2013-10-11 NOTE — Consult Note (Signed)
[facility] Consult Note  Referring Provider: No ref. provider found Primary Care Physician:  No primary provider on file. Primary Gastroenterologist:  Dr.  Laurel Dimmer Complaint: Abdominal pain HPI: Raven Marks is an 58 y.o. white female  with a history of gastroesophageal reflux and symptomatic gallstones who presented with gallstone pancreatitis. She is clinically improved and underwent surgery today with cholecystectomy and intraoperative cholangiogram showed 2 or 3 small stones in the common bile duct. The patient is fairly comfortable with some vertigo complaints one hour after returning to the floor. She is having minimal abdominal pain.  Past Medical History  Diagnosis Date  . GERD (gastroesophageal reflux disease)   . Vertigo     associated with anesthesia  . Endometrioid adenocarcinoma     Stage IA, grade 2    Past Surgical History  Procedure Laterality Date  . Cesarean section    . Replacement total knee      Left  . Abdominal hysterectomy  10/08/2010    Robotic, BSO, bilateral pelvic/ R periaortic LND    Medications Prior to Admission  Medication Sig Dispense Refill  . beta carotene w/minerals (OCUVITE) tablet Take 1 tablet by mouth daily.      . calcium carbonate (TUMS - DOSED IN MG ELEMENTAL CALCIUM) 500 MG chewable tablet Chew 1 tablet by mouth daily.      . Loratadine (CLARITIN PO) Take 10 mg by mouth.      . Olopatadine HCl (PATADAY) 0.2 % SOLN Apply 1 drop to eye.      Marland Kitchen omeprazole (PRILOSEC) 10 MG capsule Take 10 mg by mouth daily.        Allergies:  Allergies  Allergen Reactions  . Ambien [Zolpidem]   . Codeine   . Penicillins Rash    Family History  Problem Relation Age of Onset  . Colon cancer Mother   . Colon cancer Father     Social History:  reports that she has never smoked. She does not have any smokeless tobacco history on file. She reports that she does not drink alcohol or use illicit drugs.  Review of Systems: negative  except as above   Blood pressure 149/83, pulse 59, temperature 97.9 F (36.6 C), temperature source Oral, resp. rate 20, height _0  (1.626 m), weight 72.6 kg (160 lb 0.9 oz), SpO2 94.00%. Head: Normocephalic, without obvious abnormality, atraumatic Neck: no adenopathy, no carotid bruit, no JVD, supple, symmetrical, trachea midline and thyroid not enlarged, symmetric, no tenderness/mass/nodules Resp: clear to auscultation bilaterally Cardio: regular rate and rhythm, S1, S2 normal, no murmur, click, rub or gallop GI: Abdomen soft with positive bowel sounds. Evidence of laparoscopy incisions. Deep palpation not done. Extremities: extremities normal, atraumatic, no cyanosis or edema  Results for orders placed during the hospital encounter of 10/09/13 (from the past 48 hour(s))  CBC WITH DIFFERENTIAL     Status: Abnormal   Collection Time    10/09/13  5:23 PM      Result Value Range   WBC 13.9 (*) 4.0 - 10.5 K/uL   RBC 4.61  3.87 - 5.11 MIL/uL   Hemoglobin 14.0  12.0 - 15.0 g/dL   HCT 40.5  36.0 - 46.0 %   MCV 87.9  78.0 - 100.0 fL   MCH 30.4  26.0 - 34.0 pg   MCHC 34.6  30.0 - 36.0 g/dL   RDW 13.2  11.5 - 15.5 %   Platelets 416 (*) 150 - 400 K/uL   Neutrophils Relative % 89 (*)  43 - 77 %   Neutro Abs 12.4 (*) 1.7 - 7.7 K/uL   Lymphocytes Relative 7 (*) 12 - 46 %   Lymphs Abs 1.0  0.7 - 4.0 K/uL   Monocytes Relative 4  3 - 12 %   Monocytes Absolute 0.5  0.1 - 1.0 K/uL   Eosinophils Relative 0  0 - 5 %   Eosinophils Absolute 0.1  0.0 - 0.7 K/uL   Basophils Relative 0  0 - 1 %   Basophils Absolute 0.0  0.0 - 0.1 K/uL  COMPREHENSIVE METABOLIC PANEL     Status: Abnormal   Collection Time    10/09/13  5:23 PM      Result Value Range   Sodium 143  137 - 147 mEq/L   Potassium 4.6  3.7 - 5.3 mEq/L   Chloride 101  96 - 112 mEq/L   CO2 26  19 - 32 mEq/L   Glucose, Bld 131 (*) 70 - 99 mg/dL   BUN 10  6 - 23 mg/dL   Creatinine, Ser 0.79  0.50 - 1.10 mg/dL   Calcium 9.7  8.4 - 10.5  mg/dL   Total Protein 8.0  6.0 - 8.3 g/dL   Albumin 4.1  3.5 - 5.2 g/dL   AST 578 (*) 0 - 37 U/L   ALT 512 (*) 0 - 35 U/L   Alkaline Phosphatase 501 (*) 39 - 117 U/L   Total Bilirubin 3.4 (*) 0.3 - 1.2 mg/dL   GFR calc non Af Amer 89 (*) >90 mL/min   GFR calc Af Amer >90  >90 mL/min   Comment: (NOTE)     The eGFR has been calculated using the CKD EPI equation.     This calculation has not been validated in all clinical situations.     eGFR's persistently <90 mL/min signify possible Chronic Kidney     Disease.  LIPASE, BLOOD     Status: Abnormal   Collection Time    10/09/13  5:23 PM      Result Value Range   Lipase 288 (*) 11 - 59 U/L  COMPREHENSIVE METABOLIC PANEL     Status: Abnormal   Collection Time    10/10/13  5:45 AM      Result Value Range   Sodium 143  137 - 147 mEq/L   Potassium 4.6  3.7 - 5.3 mEq/L   Chloride 106  96 - 112 mEq/L   CO2 22  19 - 32 mEq/L   Glucose, Bld 104 (*) 70 - 99 mg/dL   BUN 13  6 - 23 mg/dL   Creatinine, Ser 0.89  0.50 - 1.10 mg/dL   Calcium 8.7  8.4 - 10.5 mg/dL   Total Protein 6.6  6.0 - 8.3 g/dL   Albumin 3.3 (*) 3.5 - 5.2 g/dL   AST 283 (*) 0 - 37 U/L   ALT 368 (*) 0 - 35 U/L   Alkaline Phosphatase 395 (*) 39 - 117 U/L   Total Bilirubin 1.0  0.3 - 1.2 mg/dL   GFR calc non Af Amer 70 (*) >90 mL/min   GFR calc Af Amer 81 (*) >90 mL/min   Comment: (NOTE)     The eGFR has been calculated using the CKD EPI equation.     This calculation has not been validated in all clinical situations.     eGFR's persistently <90 mL/min signify possible Chronic Kidney     Disease.  CBC     Status: Abnormal  Collection Time    10/10/13  5:45 AM      Result Value Range   WBC 11.0 (*) 4.0 - 10.5 K/uL   RBC 3.92  3.87 - 5.11 MIL/uL   Hemoglobin 11.7 (*) 12.0 - 15.0 g/dL   HCT 35.7 (*) 36.0 - 46.0 %   MCV 91.1  78.0 - 100.0 fL   MCH 29.8  26.0 - 34.0 pg   MCHC 32.8  30.0 - 36.0 g/dL   RDW 13.6  11.5 - 15.5 %   Platelets 342  150 - 400 K/uL  LIPASE,  BLOOD     Status: Abnormal   Collection Time    10/10/13  5:45 AM      Result Value Range   Lipase 89 (*) 11 - 59 U/L  AMYLASE     Status: None   Collection Time    10/10/13  5:45 AM      Result Value Range   Amylase 64  0 - 105 U/L  COMPREHENSIVE METABOLIC PANEL     Status: Abnormal   Collection Time    10/11/13  4:09 AM      Result Value Range   Sodium 141  137 - 147 mEq/L   Potassium 5.0  3.7 - 5.3 mEq/L   Chloride 105  96 - 112 mEq/L   CO2 23  19 - 32 mEq/L   Glucose, Bld 115 (*) 70 - 99 mg/dL   BUN 7  6 - 23 mg/dL   Creatinine, Ser 0.81  0.50 - 1.10 mg/dL   Calcium 9.0  8.4 - 10.5 mg/dL   Total Protein 7.0  6.0 - 8.3 g/dL   Albumin 3.3 (*) 3.5 - 5.2 g/dL   AST 118 (*) 0 - 37 U/L   ALT 269 (*) 0 - 35 U/L   Alkaline Phosphatase 360 (*) 39 - 117 U/L   Total Bilirubin 0.8  0.3 - 1.2 mg/dL   GFR calc non Af Amer 78 (*) >90 mL/min   GFR calc Af Amer >90  >90 mL/min   Comment: (NOTE)     The eGFR has been calculated using the CKD EPI equation.     This calculation has not been validated in all clinical situations.     eGFR's persistently <90 mL/min signify possible Chronic Kidney     Disease.  CBC     Status: Abnormal   Collection Time    10/11/13  4:09 AM      Result Value Range   WBC 12.6 (*) 4.0 - 10.5 K/uL   RBC 4.00  3.87 - 5.11 MIL/uL   Hemoglobin 11.9 (*) 12.0 - 15.0 g/dL   HCT 36.9  36.0 - 46.0 %   MCV 92.3  78.0 - 100.0 fL   MCH 29.8  26.0 - 34.0 pg   MCHC 32.2  30.0 - 36.0 g/dL   RDW 13.4  11.5 - 15.5 %   Platelets 336  150 - 400 K/uL  LIPASE, BLOOD     Status: None   Collection Time    10/11/13  4:09 AM      Result Value Range   Lipase 53  11 - 59 U/L   Dg Cholangiogram Operative  10/11/2013   CLINICAL DATA:  Cholelithiasis  EXAM: INTRAOPERATIVE CHOLANGIOGRAM  TECHNIQUE: Cholangiographic images from the C-arm fluoroscopic device were submitted for interpretation post-operatively. Please see the procedural report for the amount of contrast and the  fluoroscopy time utilized.  COMPARISON:  None.  FINDINGS:  There are multiple mobile filling defects in the distal CBD. Some smaller filling defects appear impacted at the level of the ampulla. Intrahepatic ducts are incompletely visualized, appearing decompressed centrally. No contrast passes into the duodenum.  : Filling defects in the CBD without contrast passage into the duodenum suggesting obstructing retained common duct stones.   Electronically Signed   By: Arne Cleveland M.D.   On: 10/11/2013 12:50   US Abdomen Complete  10/09/2013   CLINICAL DATA:  Pancreatitis.  Question gallstones.  EXAM: ULTRASOUND ABDOMEN COMPLETE  COMPARISON:  None.  FINDINGS: Gallbladder:  The gallbladder is contracted. Multiple small stones are present. No pericholecystic fluid is identified and the sonographer reports negative Murphy's sign. Gallbladder wall thickness could not be assessed.  Common bile duct:  Not visualized due to overlying bowel gas.  Liver:  The liver demonstrates increased echogenicity consistent with fatty infiltration. No focal lesion is identified and there is no intrahepatic biliary ductal dilatation.  IVC:  No abnormality visualized.  Pancreas:  Visualized portion unremarkable.  Spleen:  Size and appearance within normal limits.  Right Kidney:  Length: 10.0 cm. Echogenicity within normal limits. No mass or hydronephrosis visualized.  Left Kidney:  Length: 10.3 cm. Echogenicity within normal limits. No mass or hydronephrosis visualized.  Abdominal aorta:  No aneurysm visualized.  Other findings:  None.  IMPRESSION: Multiple stones are present within a contracted gallbladder. There is no pericholecystic fluid and the sonographer reports a negative Murphy's sign. Wall thickness could not be assessed.  Fatty infiltration of the liver.   Electronically Signed   By: Inge Rise M.D.   On: 10/09/2013 20:40    Assessment: Common bile duct stone seen on intraoperative cholangiogram during  cholecystectomy. Initial presentation of gallstone pancreatitis consistent with common bile duct stones. Plan:  ERCP with sphincterotomy and attempted stone extraction tomorrow. Risks included but not limited to pancreatitis bleeding perforation were explained to the patient as well as alternatives and she wishes to proceed. Scheduled for 10:00 AM Anthoni Geerts C 10/11/2013, 3:03 PM

## 2013-10-11 NOTE — Interval H&P Note (Signed)
History and Physical Interval Note:  10/11/2013 10:50 AM  Raven Marks  has presented today for surgery, with the diagnosis of gallstones,pancreatitis  The various methods of treatment have been discussed with the patient and family. After consideration of risks, benefits and other options for treatment, the patient has consented to  Procedure(s): LAPAROSCOPIC CHOLECYSTECTOMY WITH INTRAOPERATIVE CHOLANGIOGRAM (N/A) as a surgical intervention .  The patient's history has been reviewed, patient examined, no change in status, stable for surgery.  I have reviewed the patient's chart and labs.  Questions were answered to the patient's satisfaction.     Zahli Vetsch A.

## 2013-10-11 NOTE — Progress Notes (Signed)
Patient ID: Raven Marks, female   DOB: [birth year], 58 y.o.   MRN: [id number]    Subjective: Pt had a rough night due to vertigo.  Some nausea with beef broth last night.  Objective: Vital signs in last 24 hours: Temp:  [97.6 F (36.4 C)-98.6 F (37 C)] 98 F (36.7 C) (01/15 0607) Pulse Rate:  [62-78] 69 (01/15 0607) Resp:  [18-20] 20 (01/15 0607) BP: (113-163)/(57-83) 126/81 mmHg (01/15 0607) SpO2:  [96 %-98 %] 96 % (01/15 0607) Last BM Date: 10/08/13  Intake/Output from previous day: 01/14 0701 - 01/15 0700 In: 4139 [P.O.:834; I.V.:2905; IV Piggyback:400] Out: 450 [Urine:450] Intake/Output this shift:    PE: Abd: soft, NT, ND, +BS  Lab Results:   Recent Labs  10/10/13 0545 10/11/13 0409  WBC 11.0* 12.6*  HGB 11.7* 11.9*  HCT 35.7* 36.9  PLT 342 336   BMET  Recent Labs  10/10/13 0545 10/11/13 0409  NA 143 141  K 4.6 5.0  CL 106 105  CO2 22 23  GLUCOSE 104* 115*  BUN 13 7  CREATININE 0.89 0.81  CALCIUM 8.7 9.0   PT/INR No results found for this basename: LABPROT, INR,  in the last 72 hours CMP     Component Value Date/Time   NA 141 10/11/2013 0409   K 5.0 10/11/2013 0409   CL 105 10/11/2013 0409   CO2 23 10/11/2013 0409   GLUCOSE 115* 10/11/2013 0409   BUN 7 10/11/2013 0409   CREATININE 0.81 10/11/2013 0409   CALCIUM 9.0 10/11/2013 0409   PROT 7.0 10/11/2013 0409   ALBUMIN 3.3* 10/11/2013 0409   AST 118* 10/11/2013 0409   ALT 269* 10/11/2013 0409   ALKPHOS 360* 10/11/2013 0409   BILITOT 0.8 10/11/2013 0409   GFRNONAA 78* 10/11/2013 0409   GFRAA >90 10/11/2013 0409   Lipase     Component Value Date/Time   LIPASE 53 10/11/2013 0409       Studies/Results: US Abdomen Complete  10/09/2013   CLINICAL DATA:  Pancreatitis.  Question gallstones.  EXAM: ULTRASOUND ABDOMEN COMPLETE  COMPARISON:  None.  FINDINGS: Gallbladder:  The gallbladder is contracted. Multiple small stones are present. No pericholecystic fluid is identified and the sonographer reports  negative Murphy's sign. Gallbladder wall thickness could not be assessed.  Common bile duct:  Not visualized due to overlying bowel gas.  Liver:  The liver demonstrates increased echogenicity consistent with fatty infiltration. No focal lesion is identified and there is no intrahepatic biliary ductal dilatation.  IVC:  No abnormality visualized.  Pancreas:  Visualized portion unremarkable.  Spleen:  Size and appearance within normal limits.  Right Kidney:  Length: 10.0 cm. Echogenicity within normal limits. No mass or hydronephrosis visualized.  Left Kidney:  Length: 10.3 cm. Echogenicity within normal limits. No mass or hydronephrosis visualized.  Abdominal aorta:  No aneurysm visualized.  Other findings:  None.  IMPRESSION: Multiple stones are present within a contracted gallbladder. There is no pericholecystic fluid and the sonographer reports a negative Murphy's sign. Wall thickness could not be assessed.  Fatty infiltration of the liver.   Electronically Signed   By: Inge Rise M.D.   On: 10/09/2013 20:40    Anti-infectives: Anti-infectives   Start     Dose/Rate Route Frequency Ordered Stop   10/10/13 0100  ciprofloxacin (CIPRO) IVPB 400 mg     400 mg 200 mL/hr over 60 Minutes Intravenous Every 12 hours 10/10/13 0025         Assessment/Plan  1. Gallstone pancreatitis  Plan: 1. To OR today for lap chole with IOC.  Patient agreeable and ready.  I have adjusted her meds for post op needs as most narcs give patient severe vertigo.     LOS: 2 days    Cesareo Vickrey E 10/11/2013, 8:19 AM Pager: [phone]

## 2013-10-11 NOTE — Transfer of Care (Signed)
Immediate Anesthesia Transfer of Care Note  Patient: Raven Marks  Procedure(s) Performed: Procedure(s): LAPAROSCOPIC CHOLECYSTECTOMY WITH INTRAOPERATIVE CHOLANGIOGRAM (N/A)  Patient Location: PACU  Anesthesia Type:General  Level of Consciousness: awake and alert   Airway & Oxygen Therapy: Patient Spontanous Breathing and Patient connected to nasal cannula oxygen  Post-op Assessment: Report given to PACU RN and Post -op Vital signs reviewed and stable  Post vital signs: Reviewed and stable  Complications: No apparent anesthesia complications

## 2013-10-11 NOTE — Progress Notes (Signed)
The procedure has been discussed with the patient. Operative and non operative treatments have been discussed. Risks of surgery include bleeding, infection,  Common bile duct injury,  Injury to the stomach,liver, colon,small intestine, abdominal wall,  Diaphragm,  Major blood vessels,  And the need for an open procedure.  Other risks include worsening of medical problems, death,  DVT and pulmonary embolism, and cardiovascular events.   Medical options have also been discussed. The patient has been informed of long term expectations of surgery and non surgical options,  The patient agrees to proceed.

## 2013-10-11 NOTE — H&P (View-Only) (Signed)
OR Thursday as long as pancreatitis better.  

## 2013-10-11 NOTE — Anesthesia Procedure Notes (Signed)
Procedure Name: Intubation Date/Time: 10/11/2013 11:36 AM Performed by: Octavio Graves Pre-anesthesia Checklist: Patient identified, Timeout performed, Emergency Drugs available, Suction available and Patient being monitored Patient Re-evaluated:Patient Re-evaluated prior to inductionOxygen Delivery Method: Circle system utilized Preoxygenation: Pre-oxygenation with 100% oxygen Intubation Type: IV induction Ventilation: Mask ventilation without difficulty Laryngoscope Size: Miller and 2 Grade View: Grade I Tube type: Oral Tube size: 7.0 mm Number of attempts: 1 Airway Equipment and Method: Stylet Placement Confirmation: ETT inserted through vocal cords under direct vision,  positive ETCO2 and breath sounds checked- equal and bilateral Secured at: 22 cm Tube secured with: Tape Dental Injury: Teeth and Oropharynx as per pre-operative assessment  Comments: IV induction Crews - Pt with chipped R front tooth prior to intubation- AM CRNA intubation- atraumatic teeth and mouth as preop

## 2013-10-11 NOTE — Progress Notes (Signed)
Pt agreed to do aromatherapy for nausea. Gave 2 drops of peppermint on a cotton ball to be inhaled. Will continue to monitor.  Devoria Albe RN

## 2013-10-11 NOTE — H&P (View-Only) (Signed)
Patient ID: Raven Marks, female   DOB: 02/17/1956, 57 y.o.   MRN: [id number]    Subjective: Pt feels much better this morning.  No pain.  No nausea  Objective: Vital signs in last 24 hours: Temp:  [98 F (36.7 C)-99.6 F (37.6 C)] 98 F (36.7 C) (01/14 0712) Pulse Rate:  [61-87] 67 (01/14 0712) Resp:  [13-20] 15 (01/14 0712) BP: (113-139)/(52-88) 134/82 mmHg (01/14 0712) SpO2:  [94 %-100 %] 95 % (01/14 0712) Weight:  [160 lb 0.9 oz (72.6 kg)-193 lb 4 oz (87.658 kg)] 160 lb 0.9 oz (72.6 kg) (01/14 0035) Last BM Date: 10/08/13  Intake/Output from previous day:   Intake/Output this shift:    PE: Abd: soft, NT, ND, +BS Heart: regular Lungs: CTAB  Lab Results:   Recent Labs  10/09/13 1723 10/10/13 0545  WBC 13.9* 11.0*  HGB 14.0 11.7*  HCT 40.5 35.7*  PLT 416* 342   BMET  Recent Labs  10/09/13 1723 10/10/13 0545  NA 143 143  K 4.6 4.6  CL 101 106  CO2 26 22  GLUCOSE 131* 104*  BUN 10 13  CREATININE 0.79 0.89  CALCIUM 9.7 8.7   PT/INR No results found for this basename: LABPROT, INR,  in the last 72 hours CMP     Component Value Date/Time   NA 143 10/10/2013 0545   K 4.6 10/10/2013 0545   CL 106 10/10/2013 0545   CO2 22 10/10/2013 0545   GLUCOSE 104* 10/10/2013 0545   BUN 13 10/10/2013 0545   CREATININE 0.89 10/10/2013 0545   CALCIUM 8.7 10/10/2013 0545   PROT 6.6 10/10/2013 0545   ALBUMIN 3.3* 10/10/2013 0545   AST 283* 10/10/2013 0545   ALT 368* 10/10/2013 0545   ALKPHOS 395* 10/10/2013 0545   BILITOT 1.0 10/10/2013 0545   GFRNONAA 70* 10/10/2013 0545   GFRAA 81* 10/10/2013 0545   Lipase     Component Value Date/Time   LIPASE 89* 10/10/2013 0545       Studies/Results: Us Abdomen Complete  10/09/2013   CLINICAL DATA:  Pancreatitis.  Question gallstones.  EXAM: ULTRASOUND ABDOMEN COMPLETE  COMPARISON:  None.  FINDINGS: Gallbladder:  The gallbladder is contracted. Multiple small stones are present. No pericholecystic fluid is identified and the  sonographer reports negative Murphy's sign. Gallbladder wall thickness could not be assessed.  Common bile duct:  Not visualized due to overlying bowel gas.  Liver:  The liver demonstrates increased echogenicity consistent with fatty infiltration. No focal lesion is identified and there is no intrahepatic biliary ductal dilatation.  IVC:  No abnormality visualized.  Pancreas:  Visualized portion unremarkable.  Spleen:  Size and appearance within normal limits.  Right Kidney:  Length: 10.0 cm. Echogenicity within normal limits. No mass or hydronephrosis visualized.  Left Kidney:  Length: 10.3 cm. Echogenicity within normal limits. No mass or hydronephrosis visualized.  Abdominal aorta:  No aneurysm visualized.  Other findings:  None.  IMPRESSION: Multiple stones are present within a contracted gallbladder. There is no pericholecystic fluid and the sonographer reports a negative Murphy's sign. Wall thickness could not be assessed.  Fatty infiltration of the liver.   Electronically Signed   By: Thomas  Dalessio M.D.   On: 10/09/2013 20:40    Anti-infectives: Anti-infectives   Start     Dose/Rate Route Frequency Ordered Stop   10/10/13 0100  ciprofloxacin (CIPRO) IVPB 400 mg     400 mg 200 mL/hr over 60 Minutes Intravenous Every 12 hours 10/10/13 0025           Assessment/Plan  1. Gallstone pancreatitis 2. H/o endometrial ca  Plan: 1. Will cont to allow pancreatitis to improve today.  She may have some clear liquids, but NPO p MN with plans for lap chole in the am. 2. Her labs are trending down.  Suspect she passed a stone.     LOS: 1 day    Lew Prout E 10/10/2013, 8:22 AM Pager: [phone]

## 2013-10-11 NOTE — Anesthesia Preprocedure Evaluation (Signed)
Anesthesia Evaluation  Patient identified by MRN, date of birth, ID band Patient awake    Reviewed: Allergy & Precautions, H&P , NPO status , Patient's Chart, lab work & pertinent test results  Airway Mallampati: I TM Distance: >3 FB Neck ROM: Full    Dental  (+) Teeth Intact and Dental Advisory Given   Pulmonary  breath sounds clear to auscultation        Cardiovascular Rhythm:Regular Rate:Normal     Neuro/Psych    GI/Hepatic GERD-  Medicated and Controlled,  Endo/Other    Renal/GU      Musculoskeletal   Abdominal   Peds  Hematology   Anesthesia Other Findings   Reproductive/Obstetrics                           Anesthesia Physical Anesthesia Plan  ASA: II  Anesthesia Plan: General   Post-op Pain Management:    Induction: Intravenous  Airway Management Planned: Oral ETT  Additional Equipment:   Intra-op Plan:   Post-operative Plan: Extubation in OR  Informed Consent: I have reviewed the patients History and Physical, chart, labs and discussed the procedure including the risks, benefits and alternatives for the proposed anesthesia with the patient or authorized representative who has indicated his/her understanding and acceptance.   Dental advisory given  Plan Discussed with: CRNA, Anesthesiologist and Surgeon  Anesthesia Plan Comments: (Pt has extreme dizzyness from Morphine and Dilaudid.  We will try Fentanyl both in OR and PACU.  Versed also causes extreme vertigo.)        Anesthesia Quick Evaluation

## 2013-10-11 NOTE — Op Note (Signed)
Laparoscopic Cholecystectomy with IOC Procedure Note  Indications: This patient presents with symptomatic gallbladder disease and will undergo laparoscopic cholecystectomy.Pt has gallstone pancreatitis and this has improved. Recommend laparoscopic cholecystectomy and cholangiogram. The procedure has been discussed with the patient. Operative and non operative treatments have been discussed. Risks of surgery include bleeding, infection,  Common bile duct injury,  Injury to the stomach,liver, colon,small intestine, abdominal wall,  Diaphragm,  Major blood vessels,  And the need for an open procedure.  Other risks include worsening of medical problems, death,  DVT and pulmonary embolism, and cardiovascular events.   Medical options have also been discussed. The patient has been informed of long term expectations of surgery and non surgical options,  The patient agrees to proceed.    Pre-operative Diagnosis: gallstone pancreatitis  Post-operative Diagnosis: gallstone pancreatitis acute cholecystitis and choledocolithiasis.   Surgeon: Turner Daniels.   Assistants: OR staff  Anesthesia: General endotracheal anesthesia and Local anesthesia 0.25.% bupivacaine, with epinephrine  ASA Class: 2  Procedure Details  The patient was seen again in the Holding Room. The risks, benefits, complications, treatment options, and expected outcomes were discussed with the patient. The possibilities of reaction to medication, pulmonary aspiration, perforation of viscus, bleeding, recurrent infection, finding a normal gallbladder, the need for additional procedures, failure to diagnose a condition, the possible need to convert to an open procedure, and creating a complication requiring transfusion or operation were discussed with the patient. The patient and/or family concurred with the proposed plan, giving informed consent. The site of surgery properly noted/marked. The patient was taken to Operating Room, identified as  Raven Marks and the procedure verified as Laparoscopic Cholecystectomy with Intraoperative Cholangiograms. A Time Out was held and the above information confirmed.  Prior to the induction of general anesthesia, antibiotic prophylaxis was administered. General endotracheal anesthesia was then administered and tolerated well. After the induction, the abdomen was prepped in the usual sterile fashion. The patient was positioned in the supine position with the left arm comfortably tucked, along with some reverse Trendelenburg.  Local anesthetic agent was injected into the skin near the umbilicus and an incision made. The midline fascia was incised and the Hasson technique was used to introduce a 12 mm port under direct vision. It was secured with a figure of eight Vicryl suture placed in the usual fashion. Pneumoperitoneum was then created with CO2 and tolerated well without any adverse changes in the patient's vital signs. Additional trocars were introduced under direct vision with an 11 mm trocar in the epigastrium and two  5 mm trocars in the right upper quadrant. All skin incisions were infiltrated with a local anesthetic agent before making the incision and placing the trocars.   The gallbladder was identified, the fundus grasped and retracted cephalad. Dome was intrahepatic.  Adhesions were lysed bluntly and with the electrocautery where indicated, taking care not to injure any adjacent organs or viscus. The infundibulum was grasped and retracted laterally, exposing the peritoneum overlying the triangle of Calot. This was then divided and exposed in a blunt fashion. The cystic duct was clearly identified and bluntly dissected circumferentially. The junctions of the gallbladder, cystic duct and common bile duct were clearly identified prior to the division of any linear structure.   An incision was made in the cystic duct and the cholangiogram catheter introduced. The catheter was secured using an  endoclip. The study showed multiple  Stones in the CBD  and good visualization of the distal and proximal biliary tree. There  was minimal flow into the duodenum.  No abnormal anatomy visualized.  The catheter was then removed. She had low back pressure.  She will need post op ERCP.   The cystic duct was then  ligated with surgical clips  on the patient side and  clipped on the gallbladder side and divided. The cystic artery was identified, dissected free, ligated with clips and divided as well. Posterior cystic artery clipped and divided.  The gallbladder was dissected from the liver bed in retrograde fashion with the electrocautery. The gallbladder was removed and placed into Endocatch bag. . The liver bed was irrigated and inspected. Hemostasis was achieved with the electrocautery and surgicel snow. Copious irrigation was utilized and was repeatedly aspirated until clear all particulate matter. Hemostasis was achieved with no signs  Of bleeding or bile leakage.  Pneumoperitoneum was completely reduced after viewing removal of the trocars under direct vision. The wound was thoroughly irrigated and the fascia was then closed with a figure of eight suture; the skin was then closed with 4 O monocryl  and a sterile dressing was applied.  Instrument, sponge, and needle counts were correct at closure and at the conclusion of the case.   Findings: Cholecystitis with Cholelithiasis and common bile duct stone   Estimated Blood Loss: Minimal         Drains: none         Total IV Fluids: 600 mL         Specimens: Gallbladder           Complications: None; patient tolerated the procedure well.         Disposition: PACU - hemodynamically stable.         Condition: stable

## 2013-10-12 ENCOUNTER — Encounter (HOSPITAL_COMMUNITY): Payer: Self-pay

## 2013-10-12 ENCOUNTER — Inpatient Hospital Stay (HOSPITAL_COMMUNITY): Payer: BC Managed Care – PPO

## 2013-10-12 ENCOUNTER — Encounter (HOSPITAL_COMMUNITY): Admission: EM | Disposition: A | Discharge status: Home / Self Care | Payer: Self-pay | Source: Home / Self Care

## 2013-10-12 ENCOUNTER — Encounter (HOSPITAL_COMMUNITY): Payer: BC Managed Care – PPO | Admitting: Anesthesiology

## 2013-10-12 ENCOUNTER — Inpatient Hospital Stay (HOSPITAL_COMMUNITY): Payer: BC Managed Care – PPO | Admitting: Anesthesiology

## 2013-10-12 HISTORY — PX: ERCP: SHX5425

## 2013-10-12 LAB — CBC
HCT: 35.1 % — ABNORMAL LOW (ref 36.0–46.0)
HEMOGLOBIN: 11.5 g/dL — AB (ref 12.0–15.0)
MCH: 30 pg (ref 26.0–34.0)
MCHC: 32.8 g/dL (ref 30.0–36.0)
MCV: 91.6 fL (ref 78.0–100.0)
Platelets: 330 10*3/uL (ref 150–400)
RBC: 3.83 MIL/uL — AB (ref 3.87–5.11)
RDW: 13.3 % (ref 11.5–15.5)
WBC: 13.6 10*3/uL — AB (ref 4.0–10.5)

## 2013-10-12 LAB — COMPREHENSIVE METABOLIC PANEL
ALK PHOS: 332 U/L — AB (ref 39–117)
ALT: 233 U/L — ABNORMAL HIGH (ref 0–35)
AST: 125 U/L — ABNORMAL HIGH (ref 0–37)
Albumin: 3 g/dL — ABNORMAL LOW (ref 3.5–5.2)
BUN: 4 mg/dL — AB (ref 6–23)
CALCIUM: 8.5 mg/dL (ref 8.4–10.5)
CO2: 23 mEq/L (ref 19–32)
Chloride: 101 mEq/L (ref 96–112)
Creatinine, Ser: 0.77 mg/dL (ref 0.50–1.10)
GFR calc non Af Amer: >90 mL/min (ref >90)
GLUCOSE: 125 mg/dL — AB (ref 70–99)
Potassium: 3.6 mEq/L — ABNORMAL LOW (ref 3.7–5.3)
Sodium: 139 mEq/L (ref 137–147)
TOTAL PROTEIN: 6.5 g/dL (ref 6.0–8.3)
Total Bilirubin: 0.9 mg/dL (ref 0.3–1.2)

## 2013-10-12 SURGERY — ERCP, WITH INTERVENTION IF INDICATED
Anesthesia: General

## 2013-10-12 MED ORDER — PROPOFOL 10 MG/ML IV BOLUS
INTRAVENOUS | Status: DC | PRN
  Administered 2013-10-12: 170 mg via INTRAVENOUS

## 2013-10-12 MED ORDER — LIDOCAINE HCL (CARDIAC) 20 MG/ML IV SOLN
INTRAVENOUS | Status: DC | PRN
  Administered 2013-10-12: 60 mg via INTRAVENOUS

## 2013-10-12 MED ORDER — SODIUM CHLORIDE 0.9 % IV SOLN
INTRAVENOUS | Status: DC | PRN
  Administered 2013-10-12: 11:00:00

## 2013-10-12 MED ORDER — FENTANYL CITRATE 0.05 MG/ML IJ SOLN
INTRAMUSCULAR | Status: DC | PRN
  Administered 2013-10-12: 50 ug via INTRAVENOUS
  Administered 2013-10-12: 100 ug via INTRAVENOUS

## 2013-10-12 MED ORDER — SUCCINYLCHOLINE CHLORIDE 20 MG/ML IJ SOLN
INTRAMUSCULAR | Status: DC | PRN
  Administered 2013-10-12: 100 mg via INTRAVENOUS

## 2013-10-12 MED ORDER — PHENYLEPHRINE HCL 10 MG/ML IJ SOLN
INTRAMUSCULAR | Status: DC | PRN
  Administered 2013-10-12 (×2): 40 ug via INTRAVENOUS
  Administered 2013-10-12 (×4): 80 ug via INTRAVENOUS

## 2013-10-12 MED ORDER — LACTATED RINGERS IV SOLN
INTRAVENOUS | Status: DC | PRN
  Administered 2013-10-12: 10:00:00 via INTRAVENOUS

## 2013-10-12 MED ORDER — ONDANSETRON HCL 4 MG/2ML IJ SOLN
INTRAMUSCULAR | Status: DC | PRN
  Administered 2013-10-12: 4 mg via INTRAVENOUS

## 2013-10-12 NOTE — Progress Notes (Signed)
ERCP today.

## 2013-10-12 NOTE — ED Provider Notes (Signed)
Medical screening examination/treatment/procedure(s) were performed by non-physician practitioner and as supervising physician I was immediately available for consultation/collaboration.  EKG Interpretation    Date/Time:  Tuesday October 09 2013 17:33:48 EST Ventricular Rate:  62 PR Interval:  128 QRS Duration: 84 QT Interval:  424 QTC Calculation: 430 R Axis:   81 Text Interpretation:  Normal sinus rhythm Normal ECG ED PHYSICIAN INTERPRETATION AVAILABLE IN [facility] Confirmed by TEST, RECORD ([id number]) on 10/11/2013 11:58:14 AM             Virgel Manifold, MD 10/12/13 1441

## 2013-10-12 NOTE — Progress Notes (Signed)
Pt c/o headache, notified pt that Tylenol 650mg  not due until 318am. Offered tramadol 15mg  prn, pt refused and stated that she would rather wait for the prn Tylenol, as the other pain meds cause her "extreme dizziness."

## 2013-10-12 NOTE — Anesthesia Postprocedure Evaluation (Signed)
  Anesthesia Post-op Note  Patient: Raven Marks  Procedure(s) Performed: Procedure(s): ENDOSCOPIC RETROGRADE CHOLANGIOPANCREATOGRAPHY (ERCP) (N/A)  Patient Location: PACU  Anesthesia Type:General  Level of Consciousness: awake  Airway and Oxygen Therapy: Patient Spontanous Breathing  Post-op Pain: mild  Post-op Assessment: Post-op Vital signs reviewed  Post-op Vital Signs: Reviewed  Complications: No apparent anesthesia complications

## 2013-10-12 NOTE — Discharge Instructions (Signed)
[facility] ______[facility] °LAPAROSCOPIC SURGERY: POST OP INSTRUCTIONS °Always review your discharge instruction sheet given to you by the facility where your surgery was performed. °IF YOU HAVE DISABILITY OR FAMILY LEAVE FORMS, YOU MUST BRING THEM TO THE OFFICE FOR PROCESSING.   °DO NOT GIVE THEM TO YOUR DOCTOR. ° °1. A prescription for pain medication may be given to you upon discharge.  Take your pain medication as prescribed, if needed.  If narcotic pain medicine is not needed, then you may take acetaminophen (Tylenol) or ibuprofen (Advil) as needed. °2. Take your usually prescribed medications unless otherwise directed. °3. If you need a refill on your pain medication, please contact your pharmacy.  They will contact our office to request authorization. Prescriptions will not be filled after 5pm or on week-ends. °4. You should follow a light diet the first few days after arrival home, such as soup and crackers, etc.  Be sure to include lots of fluids daily. °5. Most patients will experience some swelling and bruising in the area of the incisions.  Ice packs will help.  Swelling and bruising can take several days to resolve.  °6. It is common to experience some constipation if taking pain medication after surgery.  Increasing fluid intake and taking a stool softener (such as Colace) will usually help or prevent this problem from occurring.  A mild laxative (Milk of Magnesia or Miralax) should be taken according to package instructions if there are no bowel movements after 48 hours. °7. Unless discharge instructions indicate otherwise, you may remove your bandages 24-48 hours after surgery, and you may shower at that time.  You may have steri-strips (small skin tapes) in place directly over the incision.  These strips should be left on the skin for 7-10 days.  If your surgeon used skin glue on the incision, you may shower in 24 hours.  The glue will flake off over the next 2-3 weeks.  Any sutures or  staples will be removed at the office during your follow-up visit. °8. ACTIVITIES:  You may resume regular (light) daily activities beginning the next day--such as daily self-care, walking, climbing stairs--gradually increasing activities as tolerated.  You may have sexual intercourse when it is comfortable.  Refrain from any heavy lifting or straining until approved by your doctor. °a. You may drive when you are no longer taking prescription pain medication, you can comfortably wear a seatbelt, and you can safely maneuver your car and apply brakes. °b. RETURN TO WORK:  __________________________________________________________ °9. You should see your doctor in the office for a follow-up appointment approximately 2-3 weeks after your surgery.  Make sure that you call for this appointment within a day or two after you arrive home to insure a convenient appointment time. °10. OTHER INSTRUCTIONS: __________________________________________________________________________________________________________________________ __________________________________________________________________________________________________________________________ °WHEN TO CALL YOUR DOCTOR: °1. Fever over 101.0 °2. Inability to urinate °3. Continued bleeding from incision. °4. Increased pain, redness, or drainage from the incision. °5. Increasing abdominal pain ° °The clinic staff is available to answer your questions during regular business hours.  Please don’t hesitate to call and ask to speak to one of the nurses for clinical concerns.  If you have a medical emergency, go to the nearest emergency room or call 911.  A surgeon from [facility] is always on call at the hospital. °[address], [address], [place], [place]  [address] ? [address] [address], [place], [place]   [address] °[phone] ? [phone] ? FAX [phone] °Web site:   [email or web address] °

## 2013-10-12 NOTE — Anesthesia Preprocedure Evaluation (Addendum)
Anesthesia Evaluation  Patient identified by MRN, date of birth, ID band Patient awake    Reviewed: Allergy & Precautions, H&P , NPO status , Patient's Chart, lab work & pertinent test results, reviewed documented beta blocker date and time   History of Anesthesia Complications (+) PONV and history of anesthetic complications  Airway Mallampati: II TM Distance: >3 FB Neck ROM: Full    Dental  (+) Teeth Intact, Chipped and Dental Advisory Given,    Pulmonary neg pulmonary ROS,          Cardiovascular negative cardio ROS      Neuro/Psych negative neurological ROS  negative psych ROS   GI/Hepatic Neg liver ROS, GERD-  Medicated and Controlled,  Endo/Other  negative endocrine ROS  Renal/GU negative Renal ROS     Musculoskeletal negative musculoskeletal ROS (+)   Abdominal   Peds  Hematology negative hematology ROS (+)   Anesthesia Other Findings   Reproductive/Obstetrics negative OB ROS                          Anesthesia Physical Anesthesia Plan  ASA: II  Anesthesia Plan: General ETT   Post-op Pain Management:    Induction:   Airway Management Planned:   Additional Equipment:   Intra-op Plan:   Post-operative Plan:   Informed Consent:   Dental Advisory Given  Plan Discussed with: CRNA and Anesthesiologist  Anesthesia Plan Comments:         Anesthesia Quick Evaluation

## 2013-10-12 NOTE — Progress Notes (Signed)
[facility] Progress Note  Subjective: No abdominal pain overnight  Objective: Vital signs in last 24 hours: Temp:  [97.9 F (36.6 C)-99.4 F (37.4 C)] 98.6 F (37 C) (01/16 0522) Pulse Rate:  [59-87] 74 (01/16 0522) Resp:  [18-25] 18 (01/16 0936) BP: (110-149)/(72-91) 126/76 mmHg (01/16 0936) SpO2:  [92 %-98 %] 94 % (01/16 0936) Weight change:    PE: Abdomen soft  Lab Results: Results for orders placed during the hospital encounter of 10/09/13 (from the past 24 hour(s))  GLUCOSE, CAPILLARY     Status: None   Collection Time    10/11/13  5:51 PM      Result Value Range   Glucose-Capillary 90  70 - 99 mg/dL  COMPREHENSIVE METABOLIC PANEL     Status: Abnormal   Collection Time    10/12/13  1:15 AM      Result Value Range   Sodium 139  137 - 147 mEq/L   Potassium 3.6 (*) 3.7 - 5.3 mEq/L   Chloride 101  96 - 112 mEq/L   CO2 23  19 - 32 mEq/L   Glucose, Bld 125 (*) 70 - 99 mg/dL   BUN 4 (*) 6 - 23 mg/dL   Creatinine, Ser 0.77  0.50 - 1.10 mg/dL   Calcium 8.5  8.4 - 10.5 mg/dL   Total Protein 6.5  6.0 - 8.3 g/dL   Albumin 3.0 (*) 3.5 - 5.2 g/dL   AST 125 (*) 0 - 37 U/L   ALT 233 (*) 0 - 35 U/L   Alkaline Phosphatase 332 (*) 39 - 117 U/L   Total Bilirubin 0.9  0.3 - 1.2 mg/dL   GFR calc non Af Amer >90  >90 mL/min   GFR calc Af Amer >90  >90 mL/min  CBC     Status: Abnormal   Collection Time    10/12/13  1:15 AM      Result Value Range   WBC 13.6 (*) 4.0 - 10.5 K/uL   RBC 3.83 (*) 3.87 - 5.11 MIL/uL   Hemoglobin 11.5 (*) 12.0 - 15.0 g/dL   HCT 35.1 (*) 36.0 - 46.0 %   MCV 91.6  78.0 - 100.0 fL   MCH 30.0  26.0 - 34.0 pg   MCHC 32.8  30.0 - 36.0 g/dL   RDW 13.3  11.5 - 15.5 %   Platelets 330  150 - 400 K/uL    Studies/Results: Dg Cholangiogram Operative  10/11/2013   CLINICAL DATA:  Cholelithiasis  EXAM: INTRAOPERATIVE CHOLANGIOGRAM  TECHNIQUE: Cholangiographic images from the C-arm fluoroscopic device were submitted for interpretation  post-operatively. Please see the procedural report for the amount of contrast and the fluoroscopy time utilized.  COMPARISON:  None.  FINDINGS: There are multiple mobile filling defects in the distal CBD. Some smaller filling defects appear impacted at the level of the ampulla. Intrahepatic ducts are incompletely visualized, appearing decompressed centrally. No contrast passes into the duodenum.  : Filling defects in the CBD without contrast passage into the duodenum suggesting obstructing retained common duct stones.   Electronically Signed   By: Arne Cleveland M.D.   On: 10/11/2013 12:50    ERCP revealed at least 2 stones and some sludge that was removed after sphincterotomy.  Assessment: Common bile duct stones, removed endoscopically after sphincterotomy  Plan: Observe overnight for complications. Advance diet as tolerated. Recheck liver function tests in the morning. Hopefully home tomorrow.    Ezekeil Bethel C 10/12/2013, 10:49 AM

## 2013-10-12 NOTE — Progress Notes (Signed)
Patient positioned in prone position.  All measures taken to position without circulatory interference.  Pads, towels, and foam used to eliminate pressure points.  All intact and covered.

## 2013-10-12 NOTE — Preoperative (Signed)
Beta Blockers   Reason not to administer Beta Blockers:Not Applicable 

## 2013-10-12 NOTE — Transfer of Care (Signed)
Immediate Anesthesia Transfer of Care Note  Patient: Raven Marks  Procedure(s) Performed: Procedure(s): ENDOSCOPIC RETROGRADE CHOLANGIOPANCREATOGRAPHY (ERCP) (N/A)  Patient Location: PACU and Endoscopy Unit  Anesthesia Type:General  Level of Consciousness: awake, alert  and oriented  Airway & Oxygen Therapy: Patient Spontanous Breathing  Post-op Assessment: Report given to PACU RN and Post -op Vital signs reviewed and stable  Post vital signs: Reviewed and stable  Complications: No apparent anesthesia complications

## 2013-10-12 NOTE — Op Note (Signed)
[facility] [place], [id number]   ERCP PROCEDURE REPORT  PATIENT: Raven, Marks.  MR# :[id number] BIRTHDATE: March 27, 1956  GENDER: Female ENDOSCOPIST: Teena Irani, MD REFERRED BY: PROCEDURE DATE:  10/12/2013 PROCEDURE: ASA CLASS: INDICATIONS:common bile duct stones on intraoperative cholangiogram MEDICATIONS: general anesthesia TOPICAL ANESTHETIC: Cetacaine spray  DESCRIPTION OF PROCEDURE:   After the risks benefits and alternatives of the procedure were thoroughly explained, informed consent was obtained.  The Pentax side-viewing       endoscope was introduced through the mouth  and advanced to the papilla of Vater      .  the common bile duct was selectively cannulated and a cholangiogram obtained. This showed a normal appearing common bile duct. The pancreatic duct was not entered with a wire or cannula and no pancreatic gram was obtained. There appeared to be 2 or 3 small stones free floating within the common bile duct. A large sphincterotomy was performed and several balloon sweeps were made. At least 2 small intact stones were seen to be delivered as well as some sludge. There is good drainage at the end of the procedure. Last cholangiogram revealed no further persistent filling defects.        The scope was then completely withdrawn from the patient and the procedure terminated.     COMPLICATIONS: none immediate  ENDOSCOPIC IMPRESSION: common bile duct stones, removed after sphincterotomy RECOMMENDATIONS: observe overnight for complications check liver function tests the morning. Hopefully discharge soon.    _______________________________ Lorrin MaisTeena Irani, MD 10/12/2013 10:54 AM   CC:

## 2013-10-12 NOTE — Progress Notes (Signed)
Patient ID: Raven Marks, female   DOB: 08-Jan-1956, 58 y.o.   MRN: [id number] 1 Day Post-Op  Subjective: Pt feels well today.  No pain.  Only had to take tylenol this am.  C/o heartburn  Objective: Vital signs in last 24 hours: Temp:  [97.9 F (36.6 C)-99.4 F (37.4 C)] 98.6 F (37 C) (01/16 0522) Pulse Rate:  [59-87] 74 (01/16 0522) Resp:  [18-25] 18 (01/16 0522) BP: (110-149)/(72-91) 110/72 mmHg (01/16 0522) SpO2:  [92 %-98 %] 92 % (01/16 0522) Last BM Date: 10/09/13  Intake/Output from previous day: 01/15 0701 - 01/16 0700 In: 2071.3 [P.O.:480; I.V.:1591.3] Out: -  Intake/Output this shift:    PE: Abd: soft, appropriately tender, +BS, Nd, incisions c/d/i with dermabond  Lab Results:   Recent Labs  10/11/13 0409 10/12/13 0115  WBC 12.6* 13.6*  HGB 11.9* 11.5*  HCT 36.9 35.1*  PLT 336 330   BMET  Recent Labs  10/11/13 0409 10/12/13 0115  NA 141 139  K 5.0 3.6*  CL 105 101  CO2 23 23  GLUCOSE 115* 125*  BUN 7 4*  CREATININE 0.81 0.77  CALCIUM 9.0 8.5   PT/INR No results found for this basename: LABPROT, INR,  in the last 72 hours CMP     Component Value Date/Time   NA 139 10/12/2013 0115   K 3.6* 10/12/2013 0115   CL 101 10/12/2013 0115   CO2 23 10/12/2013 0115   GLUCOSE 125* 10/12/2013 0115   BUN 4* 10/12/2013 0115   CREATININE 0.77 10/12/2013 0115   CALCIUM 8.5 10/12/2013 0115   PROT 6.5 10/12/2013 0115   ALBUMIN 3.0* 10/12/2013 0115   AST 125* 10/12/2013 0115   ALT 233* 10/12/2013 0115   ALKPHOS 332* 10/12/2013 0115   BILITOT 0.9 10/12/2013 0115   GFRNONAA >90 10/12/2013 0115   GFRAA >90 10/12/2013 0115   Lipase     Component Value Date/Time   LIPASE 53 10/11/2013 0409       Studies/Results: Dg Cholangiogram Operative  10/11/2013   CLINICAL DATA:  Cholelithiasis  EXAM: INTRAOPERATIVE CHOLANGIOGRAM  TECHNIQUE: Cholangiographic images from the C-arm fluoroscopic device were submitted for interpretation post-operatively. Please see the  procedural report for the amount of contrast and the fluoroscopy time utilized.  COMPARISON:  None.  FINDINGS: There are multiple mobile filling defects in the distal CBD. Some smaller filling defects appear impacted at the level of the ampulla. Intrahepatic ducts are incompletely visualized, appearing decompressed centrally. No contrast passes into the duodenum.  : Filling defects in the CBD without contrast passage into the duodenum suggesting obstructing retained common duct stones.   Electronically Signed   By: Arne Cleveland M.D.   On: 10/11/2013 12:50    Anti-infectives: Anti-infectives   Start     Dose/Rate Route Frequency Ordered Stop   10/10/13 0100  ciprofloxacin (CIPRO) IVPB 400 mg     400 mg 200 mL/hr over 60 Minutes Intravenous Every 12 hours 10/10/13 0025         Assessment/Plan  1. Gallstone pancreatitis, POD1 s/p lap chole 2. Choledocholithiasis  Plan: 1. Headed down for ERCP currently.  Patient is otherwise doing well.  Can have liquids after ERCP and advance diet as tolerates.  Hopefully, if all goes well, she can go home tomorrow.     LOS: 3 days    Viyan Rosamond E 10/12/2013, 9:24 AM Pager: [phone]

## 2013-10-13 DIAGNOSIS — K8020 Calculus of gallbladder without cholecystitis without obstruction: Secondary | ICD-10-CM | POA: Diagnosis present

## 2013-10-13 DIAGNOSIS — K8050 Calculus of bile duct without cholangitis or cholecystitis without obstruction: Secondary | ICD-10-CM | POA: Diagnosis present

## 2013-10-13 MED ORDER — ONDANSETRON HCL 4 MG PO TABS: 4.0000 mg | ORAL_TABLET | ORAL | Status: DC | PRN

## 2013-10-13 MED ORDER — INFLUENZA VAC SPLIT QUAD 0.5 ML IM SUSP
0.5000 mL | Freq: Once | INTRAMUSCULAR | Status: AC
  Administered 2013-10-13: 0.5 mL via INTRAMUSCULAR
  Filled 2013-10-13: qty 0.5

## 2013-10-13 MED ORDER — HYDROCODONE-ACETAMINOPHEN 5-325 MG PO TABS: 1.0000 | ORAL_TABLET | ORAL | Status: DC | PRN

## 2013-10-13 MED ORDER — INFLUENZA VAC SPLIT QUAD 0.5 ML IM SUSP: 0.5000 mL | INTRAMUSCULAR | Status: DC

## 2013-10-13 NOTE — Progress Notes (Signed)
NURSING PROGRESS NOTE  Raven Marks [id number] Discharge Data: 10/13/2013 12:33 PM Attending Provider: Nolon Nations, MD PCP:No primary provider on file.     Doristine Locks to be D/C'd Home per MD order.    All IV's discontinued with no bleeding noted.  All belongings returned to patient for patient to take home.   Last Vital Signs:  Blood pressure 121/57, pulse 71, temperature 98.6 F (37 C), temperature source Oral, resp. rate 18, height 5\' 4"  (1.626 m), weight 72.6 kg (160 lb 0.9 oz), SpO2 92.00%.  Discharge Medication List   Medication List         beta carotene w/minerals tablet  Take 1 tablet by mouth daily.     calcium carbonate 500 MG chewable tablet  Commonly known as:  TUMS - dosed in mg elemental calcium  Chew 1 tablet by mouth daily.     CLARITIN PO  Take 10 mg by mouth.     HYDROcodone-acetaminophen 5-325 MG per tablet  Commonly known as:  NORCO/VICODIN  Take 1-2 tablets by mouth every 4 (four) hours as needed for moderate pain.     omeprazole 10 MG capsule  Commonly known as:  PRILOSEC  Take 10 mg by mouth daily.     ondansetron 4 MG tablet  Commonly known as:  ZOFRAN  Take 1 tablet (4 mg total) by mouth every 4 (four) hours as needed for nausea or vomiting.     PATADAY 0.2 % Soln  Generic drug:  Olopatadine HCl  Apply 1 drop to eye.        Joslyn Hy, MSN, RN, [employer]

## 2013-10-13 NOTE — Progress Notes (Signed)
Discussed with PA  Leighton Ruff. Redmond Pulling, MD, FACS General, Bariatric, & Minimally Invasive Surgery [facility], [place]

## 2013-10-13 NOTE — Progress Notes (Signed)
Patient ID: Raven Marks, female   DOB: [birth year], 57 y.o.   MRN: [id number]   LOS: 4 days   Subjective: Doing well. Pain controlled, denies N/V. Ready to go home.   Objective: Vital signs in last 24 hours: Temp:  [98.6 F (37 C)-99.3 F (37.4 C)] 98.6 F (37 C) (01/17 0648) Pulse Rate:  [71-73] 71 (01/17 0648) Resp:  [18] 18 (01/17 0648) BP: (111-121)/(57-74) 121/57 mmHg (01/17 0648) SpO2:  [92 %-97 %] 92 % (01/17 0648) Last BM Date: 10/09/13   Physical Exam General appearance: alert and no distress Resp: clear to auscultation bilaterally Cardio: regular rate and rhythm GI: Soft, port sites WNL, +BS   Assessment/Plan: 1. Gallstone pancreatitis, POD1 s/p lap chole  2. Choledocholithiasis s/p ERCP, sphincterotomy, stone removal  D/C home   Lisette Abu, PA-C Pager: [phone] 10/13/2013

## 2013-10-13 NOTE — Discharge Summary (Signed)
Physician Discharge Summary  Patient ID: [name] MRN: [id number] DOB/AGE: Jan 21, 1956 58 y.o.  Admit date: 10/09/2013 Discharge date: 10/13/2013  Discharge Diagnoses Patient Active Problem List   Diagnosis Date Noted  . Cholelithiasis 10/13/2013  . Choledocholithiasis 10/13/2013  . Acute biliary pancreatitis 10/09/2013  . 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 Teena Irani for gastroenterology   Procedures Laparoscopic cholecystectomy and intraoperative cholangiogram by Dr. Marcello Moores Cornett  ERCP, sphincterotomy, and CBD stone removal by Dr. Amedeo Plenty   HPI: Raven Marks initially developed epigastric abdominal pain on 09/20/2013. It was associated with nausea and vomiting. It resolved spontaneously. She avoided oral intake and that seemed to relieve the symptoms. This happened again on 09/27/2013. Again, she avoided oral intake for 24 hours and it improved. The pain returned on the day of admission while she was at work. She had worse vomiting which persisted and she came to the emergency department for evaluation. Her workup revealed elevated liver function tests and gallstones as well as elevated lipase. She was admitted to the general surgical service.   Hospital Course: The patient's pancreatitis was allowed to improve with bowel rest and hydration. She was taken for cholecystectomy a few days later and IOC showed common bile duct stones. Gastroenterology was consulted and performed the second listed procedure the next day. She was able to tolerate a diet, her pain was controlled, and she was discharged home in good condition.      Medication List         beta carotene w/minerals tablet  Take 1 tablet by mouth daily.     calcium carbonate 500 MG chewable tablet  Commonly known as:  TUMS - dosed in mg elemental calcium  Chew 1 tablet by mouth daily.     CLARITIN PO  Take 10 mg by mouth.     HYDROcodone-acetaminophen 5-325 MG per tablet  Commonly known  as:  NORCO/VICODIN  Take 1-2 tablets by mouth every 4 (four) hours as needed for moderate pain.     omeprazole 10 MG capsule  Commonly known as:  PRILOSEC  Take 10 mg by mouth daily.     ondansetron 4 MG tablet  Commonly known as:  ZOFRAN  Take 1 tablet (4 mg total) by mouth every 4 (four) hours as needed for nausea or vomiting.     PATADAY 0.2 % Soln  Generic drug:  Olopatadine HCl  Apply 1 drop to eye.             Follow-up Information   Follow up with [facility] On 11/06/2013. (1:30pm, arrive at 1:00pm for paperwork)    Contact information:   [address]   [place] [address] [phone]       Signed: Lisette Abu, PA-C Pager: [phone] 10/13/2013, 11:52 AM

## 2013-10-13 NOTE — Discharge Summary (Signed)
Exander Shaul M. Wilfrido Luedke, MD, FACS General, Bariatric, & Minimally Invasive Surgery [facility]  

## 2013-10-15 ENCOUNTER — Encounter (HOSPITAL_COMMUNITY): Payer: Self-pay | Admitting: Gastroenterology

## 2013-10-18 ENCOUNTER — Ambulatory Visit: Payer: Self-pay | Admitting: Obstetrics and Gynecology

## 2013-10-18 ENCOUNTER — Ambulatory Visit: Payer: Self-pay | Admitting: Obstetrics & Gynecology

## 2013-11-06 ENCOUNTER — Encounter (INDEPENDENT_AMBULATORY_CARE_PROVIDER_SITE_OTHER): Payer: BC Managed Care – PPO

## 2013-11-13 ENCOUNTER — Encounter (INDEPENDENT_AMBULATORY_CARE_PROVIDER_SITE_OTHER): Payer: BC Managed Care – PPO

## 2013-12-27 ENCOUNTER — Encounter: Payer: Self-pay | Admitting: Obstetrics & Gynecology

## 2013-12-31 ENCOUNTER — Telehealth: Payer: Self-pay | Admitting: Obstetrics & Gynecology

## 2013-12-31 ENCOUNTER — Ambulatory Visit: Payer: Self-pay | Admitting: Obstetrics & Gynecology

## 2013-12-31 NOTE — Telephone Encounter (Signed)
Patient canceled her AEX appointment today. Daughter-in-law is going into labor. Patient rescheduled to 01/25/13 with Dr.Miller.

## 2014-01-25 ENCOUNTER — Encounter: Payer: Self-pay | Admitting: Obstetrics & Gynecology

## 2014-01-25 ENCOUNTER — Ambulatory Visit (INDEPENDENT_AMBULATORY_CARE_PROVIDER_SITE_OTHER): Payer: BC Managed Care – PPO | Admitting: Obstetrics & Gynecology

## 2014-01-25 VITALS — BP 124/78 | HR 68 | Resp 20 | Ht 64.25 in | Wt 179.2 lb

## 2014-01-25 DIAGNOSIS — Z0000 Encounter for general adult medical examination without abnormal findings: Secondary | ICD-10-CM

## 2014-01-25 DIAGNOSIS — Z01419 Encounter for gynecological examination (general) (routine) without abnormal findings: Secondary | ICD-10-CM

## 2014-01-25 DIAGNOSIS — Z124 Encounter for screening for malignant neoplasm of cervix: Secondary | ICD-10-CM

## 2014-01-25 LAB — POCT URINALYSIS DIPSTICK
BILIRUBIN UA: NEGATIVE
Glucose, UA: NEGATIVE
Ketones, UA: NEGATIVE
Leukocytes, UA: NEGATIVE
NITRITE UA: NEGATIVE
PH UA: 5
PROTEIN UA: NEGATIVE
Urobilinogen, UA: NEGATIVE

## 2014-01-25 MED ORDER — MECLIZINE HCL 50 MG PO TABS: 50.0000 mg | ORAL_TABLET | Freq: Three times a day (TID) | ORAL | Status: DC | PRN

## 2014-01-25 NOTE — Progress Notes (Addendum)
58 y.o. G1P1 MarriedCaucasianF here for annual exam.  Doing really well.  No vaginal bleeding.  Had gallbladder removed 1/15 by Dr. Brantley Stage.    Saw Dr. Sondra Come for the vaginal cuff radiation.  She uses dilators and this has helped.    Patient's last menstrual period was 09/27/2010.          Sexually active: yes  The current method of family planning is status post hysterectomy.    Exercising: no  not regularly Smoker:  no  Health Maintenance: Pap:  04/25/13 with Dr Gehrig/h/x endometrial cancer History of abnormal Pap:  yes MMG:  05/11/13 3D-normal Colonoscopy:  7/12 with Dr Cristina Gong every 4 years due to family hx of colon cancer BMD:   none TDaP:  Up to date with Dr Harrington Challenger Screening Labs: in [place], Hb today: in [place], Urine today: RBC-trace   reports that she has never smoked. She has never used smokeless tobacco. She reports that she does not drink alcohol or use illicit drugs.  Past Medical History  Diagnosis Date  . GERD (gastroesophageal reflux disease)   . Vertigo     associated with anesthesia  . Endometrioid adenocarcinoma     Stage IA, grade 2  . Complication of anesthesia     vertigo  . PONV (postoperative nausea and vomiting)   . Vitamin D deficiency 09/2012  . Congenital absence of femur     right  . Presence of artificial leg     Right    Past Surgical History  Procedure Laterality Date  . Cesarean section    . Replacement total knee      Left  . Abdominal hysterectomy  10/08/2010    Robotic, BSO, bilateral pelvic/ R periaortic LND  . Cholecystectomy    . Cholecystectomy N/A 10/11/2013    Procedure: LAPAROSCOPIC CHOLECYSTECTOMY WITH INTRAOPERATIVE CHOLANGIOGRAM;  Surgeon: Joyice Faster. Cornett, MD;  Location: [facility];  Service: General;  Laterality: N/A;  . Ercp N/A 10/12/2013    Procedure: ENDOSCOPIC RETROGRADE CHOLANGIOPANCREATOGRAPHY (ERCP);  Surgeon: Missy Sabins, MD;  Location: [facility] ENDOSCOPY;  Service: Endoscopy;  Laterality: N/A;    Current Outpatient  Prescriptions  Medication Sig Dispense Refill  . acetaminophen (TYLENOL) 325 MG tablet Take 650 mg by mouth as needed.      . beta carotene w/minerals (OCUVITE) tablet Take 1 tablet by mouth daily.      . Loratadine (CLARITIN PO) Take 10 mg by mouth.      . meclizine (ANTIVERT) 50 MG tablet Take 50 mg by mouth 3 (three) times daily as needed.      Marland Kitchen omeprazole (PRILOSEC) 10 MG capsule Take 10 mg by mouth daily.      Marland Kitchen HYDROcodone-acetaminophen (NORCO/VICODIN) 5-325 MG per tablet Take 1-2 tablets by mouth every 4 (four) hours as needed for moderate pain.  20 tablet  0  . ondansetron (ZOFRAN) 4 MG tablet Take 1 tablet (4 mg total) by mouth every 4 (four) hours as needed for nausea or vomiting.  20 tablet  0   No current facility-administered medications for this visit.    Family History  Problem Relation Age of Onset  . Colon cancer Mother   . Colon cancer Father   . Asthma Paternal Grandmother   . Breast cancer Maternal Grandmother     ROS:  Pertinent items are noted in HPI.  Otherwise, a comprehensive ROS was negative.  Exam:   BP 124/78  Pulse 68  Resp 20  Ht 5' 4.25" (1.632 m)  Wt 179 lb 3.2 oz (81.285 kg)  BMI 30.52 kg/m2  LMP 09/27/2010      Height: 5' 4.25" (163.2 cm)  Ht Readings from Last 3 Encounters:  01/25/14 5' 4.25" (1.632 m)  10/10/13 5\' 4"  (1.626 m)  10/10/13 5\' 4"  (1.626 m)    General appearance: alert, cooperative and appears stated age Head: Normocephalic, without obvious abnormality, atraumatic Neck: no adenopathy, supple, symmetrical, trachea midline and thyroid normal to inspection and palpation Lungs: clear to auscultation bilaterally Breasts: normal appearance, no masses or tenderness Heart: regular rate and rhythm Abdomen: soft, non-tender; bowel sounds normal; no masses,  no organomegaly Extremities: extremities normal, atraumatic, no cyanosis or edema Skin: Skin color, texture, turgor normal. No rashes or lesions Lymph nodes: Cervical,  supraclavicular, and axillary nodes normal. No abnormal inguinal nodes palpated Neurologic: Grossly normal  Pelvic: External genitalia:  no lesions              Urethra:  normal appearing urethra with no masses, tenderness or lesions              Bartholins and Skenes: normal                 Vagina: normal appearing vagina with normal color and discharge, no lesions              Cervix: absent              Pap taken: yes Bimanual Exam:  Uterus:  uterus absent              Adnexa: no mass, fullness, tenderness               Rectovaginal: Confirms               Anus:  normal sphincter tone, no lesions  A:  Well Woman with normal exam Vertigo Both parents with hx of breast cancer   H/O IB endometrial adenocarcinoma 1/12, s/p TLH/BSO/bilateral LND.  Subsequent vaginal cuff radiation.    P:   Mammogram yearly.  BMD to be scheduled at same time. pap smear obtained today.  Antivert 50mg  TID prn.  Rx to pharmacy to hold. We discussed family hx of breast, colon, and endometrial cancer.  Feel genetic counseling appropriate.  Number given to pt.  She will let me know if need referral.  Consider Lynch testing.   CC note to Dr. Alycia Rossetti. return annually or prn  An After Visit Summary was printed and given to the patient.

## 2014-01-28 LAB — IPS PAP SMEAR ONLY

## 2014-02-21 ENCOUNTER — Telehealth: Payer: Self-pay

## 2014-02-21 NOTE — Telephone Encounter (Signed)
lmtcb to inform MMG and BMD scheduled for 06/12/14 at 3:30 and 4:00//kn

## 2014-07-12 ENCOUNTER — Other Ambulatory Visit: Payer: Self-pay

## 2014-07-23 ENCOUNTER — Telehealth: Payer: Self-pay

## 2014-07-23 NOTE — Telephone Encounter (Signed)
Lmtcb-to discuss BMD results (hard copy).//kn

## 2014-07-29 ENCOUNTER — Encounter: Payer: Self-pay | Admitting: Obstetrics & Gynecology

## 2014-07-29 NOTE — Telephone Encounter (Signed)
Lmtcb//kn 

## 2014-07-30 NOTE — Telephone Encounter (Signed)
Patient notified of BMD results. Hard copy will be scanned in epic//kn

## 2014-07-31 ENCOUNTER — Encounter: Payer: Self-pay | Admitting: Gynecologic Oncology

## 2014-07-31 ENCOUNTER — Other Ambulatory Visit (HOSPITAL_COMMUNITY)
Admission: RE | Admit: 2014-07-31 | Discharge: 2014-07-31 | Disposition: A | Discharge status: Home / Self Care | Payer: BC Managed Care – PPO | Source: Ambulatory Visit | Attending: Gynecologic Oncology | Admitting: Gynecologic Oncology

## 2014-07-31 ENCOUNTER — Ambulatory Visit: Payer: BC Managed Care – PPO | Attending: Gynecologic Oncology | Admitting: Gynecologic Oncology

## 2014-07-31 DIAGNOSIS — Z90710 Acquired absence of both cervix and uterus: Secondary | ICD-10-CM | POA: Insufficient documentation

## 2014-07-31 DIAGNOSIS — Z923 Personal history of irradiation: Secondary | ICD-10-CM | POA: Diagnosis not present

## 2014-07-31 DIAGNOSIS — Z01411 Encounter for gynecological examination (general) (routine) with abnormal findings: Secondary | ICD-10-CM | POA: Diagnosis not present

## 2014-07-31 DIAGNOSIS — Q7241 Longitudinal reduction defect of right femur: Secondary | ICD-10-CM | POA: Diagnosis not present

## 2014-07-31 DIAGNOSIS — K219 Gastro-esophageal reflux disease without esophagitis: Secondary | ICD-10-CM | POA: Diagnosis not present

## 2014-07-31 DIAGNOSIS — C541 Malignant neoplasm of endometrium: Secondary | ICD-10-CM | POA: Diagnosis not present

## 2014-07-31 DIAGNOSIS — Z90722 Acquired absence of ovaries, bilateral: Secondary | ICD-10-CM | POA: Diagnosis not present

## 2014-07-31 NOTE — Progress Notes (Signed)
Consult Note: Gyn-Onc  YULISSA NEEDHAM 58 y.o. female  CC:  Chief Complaint  Patient presents with  . Endometrial Cancer    HPI: HISTORY OF PRESENT ILLNESS: Ms. Remick is a 58 year old who had a grade 1 endometrial cancer on biopsy. In January 2012, she underwent robotic hysterectomy, BSO, bilateral pelvic and right para-aortic lymph  node dissection. Final pathology was consistent with a stage IB, grade 2 endometrioid adenocarcinoma. She had grade 2 lesion with 0.7/1.2 cm of myometrial thickness. There was no lymphovascular space involvement,  0/12 lymph nodes were involved. She underwent postoperative radiation therapy under the care of Dr. Sondra Come, consisting of [place]. She was last seen by me in 6/13 at which time her exam and Pap smear were negative.   She comes in today for followup. She is overall doing fairly well.   Interval History:  She saw Dr. Sabra Heck about 6 months ago and had a negative exam and Pap smear. She had a bone density at that time that showed some osteopenia in her spine but no osteoporosis. She is up-to-date on her mammograms. She does feel like she has occasional phantom cramping pain in the pelvis that happens about every 2-3 months. She has been sexually active on occasion and she experiences no dyspareunia. Her husband was having issues with congestive heart failure last time I saw her but that is improved. She states that due to insurance issues is very expensive for her to be seen by [place] as we are hospital-based clinic. She would like to follow-up with Dr. Sabra Heck every 6 months as she is "passed the midway point" and only return to see [place] if there's any issues or problems. I would be comfortable with that. She is getting a new prosthetic leg.  Review of Systems She denies any vaginal bleeding.    Constitutional:  Denies fever. Skin: + rash between legs Cardiovascular: No chest pain, shortness of breath Pulmonary: No cough  Gastro Intestinal: No nausea,  vomiting, constipation, or diarrhea reported. No bright red blood per rectum or change in bowel movement.  Genitourinary: No frequency, urgency, or dysuria.  Denies vaginal bleeding and discharge.  Musculoskeletal: No myalgia, arthralgia, joint swelling or pain. Psychology: No changes   Current Meds:  Outpatient Encounter Prescriptions as of 07/31/2014  Medication Sig  . acetaminophen (TYLENOL) 325 MG tablet Take 650 mg by mouth as needed.  . beta carotene w/minerals (OCUVITE) tablet Take 1 tablet by mouth daily.  Marland Kitchen HYDROcodone-acetaminophen (NORCO/VICODIN) 5-325 MG per tablet Take 1-2 tablets by mouth every 4 (four) hours as needed for moderate pain.  . Loratadine (CLARITIN PO) Take 10 mg by mouth.  . meclizine (ANTIVERT) 50 MG tablet Take 1 tablet (50 mg total) by mouth 3 (three) times daily as needed.  Marland Kitchen omeprazole (PRILOSEC) 10 MG capsule Take 10 mg by mouth daily.  . ondansetron (ZOFRAN) 4 MG tablet Take 1 tablet (4 mg total) by mouth every 4 (four) hours as needed for nausea or vomiting.    Allergy:  Allergies  Allergen Reactions  . Ambien [Zolpidem]   . Codeine   . Penicillins Rash    Social Hx:   History   Social History  . Marital Status: Married    Spouse Name: N/A    Number of Children: N/A  . Years of Education: N/A   Occupational History  . Not on file.   Social History Main Topics  . Smoking status: Never Smoker   . Smokeless tobacco: Never Used  .  Alcohol Use: No  . Drug Use: No  . Sexual Activity:    Partners: Male    Birth Control/ Protection: Surgical     Comment: TLH/BSO   Other Topics Concern  . Not on file   Social History Narrative    Past Surgical Hx:  Past Surgical History  Procedure Laterality Date  . Cesarean section    . Replacement total knee      Left  . Abdominal hysterectomy  10/08/2010    Robotic, BSO, bilateral pelvic/ R periaortic LND  . Cholecystectomy    . Cholecystectomy N/A 10/11/2013    Procedure: LAPAROSCOPIC  CHOLECYSTECTOMY WITH INTRAOPERATIVE CHOLANGIOGRAM;  Surgeon: Joyice Faster. Cornett, MD;  Location: [facility];  Service: General;  Laterality: N/A;  . Ercp N/A 10/12/2013    Procedure: ENDOSCOPIC RETROGRADE CHOLANGIOPANCREATOGRAPHY (ERCP);  Surgeon: Missy Sabins, MD;  Location: [facility] ENDOSCOPY;  Service: Endoscopy;  Laterality: N/A;    Past Medical Hx:  Past Medical History  Diagnosis Date  . GERD (gastroesophageal reflux disease)   . Vertigo     associated with anesthesia  . Endometrioid adenocarcinoma     Stage IA, grade 2  . Complication of anesthesia     vertigo  . PONV (postoperative nausea and vomiting)   . Vitamin D deficiency 09/2012  . Congenital absence of femur     right  . 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 Colon cancer Mother   . Colon cancer Father   . Asthma Paternal Grandmother   . Breast cancer Maternal Grandmother     Vitals:  Blood pressure 125/70, pulse 76, temperature 98.2 F (36.8 C), temperature source Oral, resp. rate 20, height 5\' 4"  (1.626 m), weight 188 lb (85.276 kg), last menstrual period 09/27/2010.  Physical Exam:  GENERAL: Well-nourished, well-developed female, in no acute distress.   NECK: Supple. There is no lymphadenopathy, no thyromegaly.   LUNGS: Clear to auscultation bilaterally.   CARDIOVASCULAR: Regular rate and rhythm.   ABDOMEN: She has well-healed surgical incisions. Abdomen is soft, nontender, nondistended. There are no palpable masses or hepatosplenomegaly. Groins are negative for adenopathy.   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 The left leg is unremarkable.   PELVIC:  The vagina is without lesions. The vaginal cuff is visualized. There are no visible lesions. ThinPrep Pap was submitted without difficulty. Bimanual examination reveals no masses or nodularity. Rectal confirms.   ASSESSMENT:  A 58 year old  with a stage IB, grade 2 endometrioid adenocarcinoma, status post appropriate surgery and vaginal cuff brachytherapy. 1. We will follow up on the results of her Pap smear from today.  2. She will see Dr. Sabra Heck in 6 months and return to see [place] prn.      Kadance Mccuistion A., MD 07/31/2014, 3:31 PM

## 2014-07-31 NOTE — Patient Instructions (Addendum)
We will notify you of the results of your pap smear. Follow up in 6 months with Dr. Sabra Heck.

## 2014-08-05 LAB — CYTOLOGY - PAP

## 2014-08-06 ENCOUNTER — Telehealth: Payer: Self-pay | Admitting: *Deleted

## 2014-08-06 NOTE — Telephone Encounter (Signed)
-----   Message from Dorothyann Gibbs, NP sent at 08/06/2014 10:37 AM EST ----- Please let her know that her pap smear is normal.  Thank you  ----- Message -----    From: Lab in [facility] [place] [facility] Interface    Sent: 08/05/2014   4:27 PM      To: Dorothyann Gibbs, NP

## 2014-08-06 NOTE — Telephone Encounter (Signed)
Lmovm for pt pap smear results normal, please call our office with any concerns.

## 2015-02-03 ENCOUNTER — Ambulatory Visit: Payer: BC Managed Care – PPO | Admitting: Obstetrics & Gynecology

## 2015-03-13 ENCOUNTER — Ambulatory Visit (INDEPENDENT_AMBULATORY_CARE_PROVIDER_SITE_OTHER): Payer: BC Managed Care – PPO | Admitting: Obstetrics & Gynecology

## 2015-03-13 ENCOUNTER — Encounter: Payer: Self-pay | Admitting: Obstetrics & Gynecology

## 2015-03-13 VITALS — BP 142/82 | HR 64 | Resp 16 | Ht 64.25 in | Wt 188.0 lb

## 2015-03-13 DIAGNOSIS — Z124 Encounter for screening for malignant neoplasm of cervix: Secondary | ICD-10-CM

## 2015-03-13 DIAGNOSIS — Z01419 Encounter for gynecological examination (general) (routine) without abnormal findings: Secondary | ICD-10-CM | POA: Diagnosis not present

## 2015-03-13 DIAGNOSIS — Z0000 Encounter for general adult medical examination without abnormal findings: Secondary | ICD-10-CM | POA: Diagnosis not present

## 2015-03-13 LAB — HEMOGLOBIN, FINGERSTICK: HEMOGLOBIN, FINGERSTICK: 12.7 g/dL (ref 12.0–16.0)

## 2015-03-13 MED ORDER — NYSTATIN 100000 UNIT/GM EX CREA: 1.0000 "application " | TOPICAL_CREAM | Freq: Two times a day (BID) | CUTANEOUS | Status: DC

## 2015-03-13 NOTE — Progress Notes (Signed)
59 y.o. G1P1 MarriedCaucasianF here for annual exam.  Doing well.  No vaginal bleeding.  Considering early retirement.    Today is pt's 55 wedding anniversary.  Has 1 year and and 43 year old granddaughters.  Loves being a grandmother.  Reports itchy patch on labium on left where she often has a lot of moisture when it starts to get hot.  Has tried some powders and OTC products without any success.  Wants suggestion.  PCP:  Lona Kettle, [facility] [place] on [address].  Brand new office.  Last appt was within last few months.    Patient's last menstrual period was 09/27/2010.          Sexually active: Yes.    The current method of family planning is status post hysterectomy.    Exercising: No.  not regularly Smoker:  no  Health Maintenance: Pap:  07/31/14 WNL-with Dr Alycia Rossetti h/o endometrial cancer History of abnormal Pap:  H/o endometrial cancer 1/12 MMG:  10/15 Colonoscopy: 7/12 with Dr Cristina Gong every 4 years due to family hx of colon cancer BMD: -1.1 TDaP: Up to date with Dr Harrington Challenger Screening Labs: PCP, Hb today: PCP, Urine today: tr WBC, tr RBCs   reports that she has never smoked. She has never used smokeless tobacco. She reports that she does not drink alcohol or use illicit drugs.  Past Medical History  Diagnosis Date  . GERD (gastroesophageal reflux disease)   . Vertigo     associated with anesthesia  . Endometrioid adenocarcinoma     Stage IA, grade 2  . Complication of anesthesia     vertigo  . PONV (postoperative nausea and vomiting)   . Vitamin D deficiency 09/2012  . Congenital absence of femur     right  . 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 Cesarean section    . Replacement total knee      Left  . Abdominal hysterectomy  10/08/2010    Robotic, BSO, bilateral pelvic/ R periaortic LND  . Cholecystectomy    . Cholecystectomy N/A 10/11/2013    Procedure: LAPAROSCOPIC CHOLECYSTECTOMY  WITH INTRAOPERATIVE CHOLANGIOGRAM;  Surgeon: Joyice Faster. Cornett, MD;  Location: [facility];  Service: General;  Laterality: N/A;  . Ercp N/A 10/12/2013    Procedure: ENDOSCOPIC RETROGRADE CHOLANGIOPANCREATOGRAPHY (ERCP);  Surgeon: Missy Sabins, MD;  Location: [facility] ENDOSCOPY;  Service: Endoscopy;  Laterality: N/A;    Current Outpatient Prescriptions  Medication Sig Dispense Refill  . acetaminophen (TYLENOL) 325 MG tablet Take 650 mg by mouth as needed.    . beta carotene w/minerals (OCUVITE) tablet Take 1 tablet by mouth daily.    . Loratadine (CLARITIN PO) Take 10 mg by mouth.    . LOTEMAX 0.5 % GEL     . omeprazole (PRILOSEC) 10 MG capsule Take 10 mg by mouth daily.    Marland Kitchen HYDROcodone-acetaminophen (NORCO/VICODIN) 5-325 MG per tablet Take 1-2 tablets by mouth every 4 (four) hours as needed for moderate pain. (Patient not taking: Reported on 03/13/2015) 20 tablet 0  . meclizine (ANTIVERT) 50 MG tablet Take 1 tablet (50 mg total) by mouth 3 (three) times daily as needed. (Patient not taking: Reported on 03/13/2015) 30 tablet 2  . ondansetron (ZOFRAN) 4 MG tablet Take 1 tablet (4 mg total) by mouth every 4 (four) hours as needed for nausea or vomiting. (Patient not taking: Reported on 03/13/2015) 20 tablet 0  No current facility-administered medications for this visit.    Family History  Problem Relation Age of Onset  . Colon cancer Mother   . Colon cancer Father   . Asthma Paternal Grandmother   . Breast cancer Maternal Grandmother     ROS:  Pertinent items are noted in HPI.  Otherwise, a comprehensive ROS was negative.  Exam:   BP 142/82 mmHg  Pulse 64  Resp 16  Ht 5' 4.25" (1.632 m)  Wt 188 lb (85.276 kg)  BMI 32.02 kg/m2  LMP 09/27/2010  Weight change: +9#   Height: 5' 4.25" (163.2 cm) (with shoes)  Ht Readings from Last 3 Encounters:  03/13/15 5' 4.25" (1.632 m)  07/31/14 5\' 4"  (1.626 m)  01/25/14 5' 4.25" (1.632 m)    General appearance: alert, cooperative and appears stated  age Head: Normocephalic, without obvious abnormality, atraumatic Neck: no adenopathy, supple, symmetrical, trachea midline and thyroid normal to inspection and palpation Lungs: clear to auscultation bilaterally Breasts: normal appearance, no masses or tenderness Heart: regular rate and rhythm Abdomen: soft, non-tender; bowel sounds normal; no masses,  no organomegaly Extremities: extremities normal, atraumatic, no cyanosis or edema Skin: Skin color, texture, turgor normal. No rashes or lesions Lymph nodes: Cervical, supraclavicular, and axillary nodes normal. No abnormal inguinal nodes palpated Neurologic: Grossly normal   Pelvic: External genitalia:  Thickened, whitish skin change on out left labium major with excoriations c/w itching              Urethra:  normal appearing urethra with no masses, tenderness or lesions              Bartholins and Skenes: normal                 Vagina: normal appearing vagina with normal color and discharge, no lesions              Cervix: absent              Pap taken: Yes.   Bimanual Exam:  Uterus:  uterus absent              Adnexa: no mass, fullness, tenderness               Rectovaginal: Confirms               Anus:  normal sphincter tone, no lesions  Chaperone was present for exam.  A: Well Woman with normal exam Vertigo Mildly elevated BP Both parents with hx of colon cancer  Sister just diagnosed with endometrial cancer H/O IB endometrial adenocarcinoma 1/12, s/p TLH/BSO/bilateral LND.  Vaginal cuff radiation.  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 Doing yearly 3D. pap smear obtained today.   Recheck six months.  Pt will then be at five years post diagnosis and can return to yearly screening.  Aware of need for yearly pap after that time. We again discussed genetic testing for Lynch Syndrome.  She is still  considering whether she would like to have. Nystatin cream bid up to 14 days to see if helps vulvar itching.  If not, will try topical steroid cream/ointment.  If this doesn't help, will consider vulvar biopsy return annually or prn

## 2015-03-14 LAB — IPS PAP SMEAR ONLY

## 2015-04-11 ENCOUNTER — Other Ambulatory Visit: Payer: Self-pay | Admitting: Obstetrics & Gynecology

## 2015-04-11 NOTE — Telephone Encounter (Signed)
Medication refill request: Antivert 25 Last AEX:  03/13/15 with SM Next AEX: 10/02/15 with SM Last MMG (if hormonal medication request):  Refill authorized: Please advise.

## 2015-10-02 ENCOUNTER — Ambulatory Visit (INDEPENDENT_AMBULATORY_CARE_PROVIDER_SITE_OTHER): Payer: BC Managed Care – PPO | Admitting: Obstetrics & Gynecology

## 2015-10-02 ENCOUNTER — Encounter: Payer: Self-pay | Admitting: Obstetrics & Gynecology

## 2015-10-02 VITALS — BP 124/80 | HR 70 | Resp 16 | Ht 64.5 in | Wt 189.0 lb

## 2015-10-02 DIAGNOSIS — C541 Malignant neoplasm of endometrium: Secondary | ICD-10-CM | POA: Diagnosis not present

## 2015-10-02 NOTE — Progress Notes (Signed)
Subjective:     Patient ID: Raven Marks, female   DOB: January 04, 1956, 60 y.o.   MRN: [id number]  HPI 60 yo G1P1 MWF here for repeat Pap smear and physical exam due to hx of Grade 2, Stage 1A endometrioid adenocarcinoma diagnosed 12/11 with definitive surgery done 10/07/10.  Pt did have vaginal cuff radiation as adjuvant therapy.  Pt reports she is doing very well and had a nice holiday.    Denies vaginal bleeding. Has occasional hot flashes that are not bothersome.  No changes with bladder or bowel habits.  No pelvic pain.  Reviewed hx with pt and return to routine yearly examinations after today.  Also reviewed changes in pap recommendations.  Pt feels more comfortable having a yearly Pap smear so will plan to continue.  Pt is very grateful for her care from both me and Dr. Alycia Rossetti.  Reports her energy is good.    Review of Systems  All other systems reviewed and are negative.      Objective:   Physical Exam  Constitutional: She appears well-developed and well-nourished.  Abdominal: Soft. Bowel sounds are normal. She exhibits no distension and no mass. There is no tenderness. There is no rebound and no guarding.  Genitourinary: Vagina normal. There is no rash, tenderness, lesion or injury on the right labia. There is no rash, tenderness, lesion or injury on the left labia.  Atrophic vaginal tissue.  Absent cervix and uterus.  No pelvic/adnexal masses.  Pap obtained.    Lymphadenopathy:       Right: No inguinal adenopathy present.       Left: No inguinal adenopathy present.  Skin: Skin is warm and dry.  Psychiatric: She has a normal mood and affect.       Assessment:     H/O Stage 1A, grade 2, endometrioid adenocarcinoma of the uterus s/p robotic TLH/BSO/pelvic LND and periaortic LND 09/2010.  Pt is now five years out from surgery.      Plan:     Pap obtained today.  Pt is five years out from her diagnosis in 2012.  She will return in one year for AEX.

## 2015-10-06 LAB — IPS PAP TEST WITH REFLEX TO HPV

## 2015-12-22 ENCOUNTER — Telehealth: Payer: Self-pay

## 2015-12-22 NOTE — Telephone Encounter (Signed)
Spoke with patient at time of incoming call. Patient states that she noticed a lump in her right breast over the weekend. Denies any redness, swelling, pain, or nipple discharge from the right breast. Most recent mammogram was performed on 09/25/2015 at [facility]- Birads grade 2 benign. Advised she will need to be seen in the office for further evaluation. She is agreeable. 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 Appointment scheduled for 12/25/2015 at 10 am with Dr.Miller. She is agreeable to date and time. Aware if she develops new symptoms or symptoms worsen she will need to be seen in the office sooner.   Routing to provider for final review. Patient agreeable to disposition. Will close encounter.

## 2015-12-25 ENCOUNTER — Ambulatory Visit (INDEPENDENT_AMBULATORY_CARE_PROVIDER_SITE_OTHER): Payer: BC Managed Care – PPO | Admitting: Obstetrics & Gynecology

## 2015-12-25 VITALS — BP 140/92 | HR 80 | Resp 18 | Ht 64.5 in | Wt 192.0 lb

## 2015-12-25 DIAGNOSIS — N63 Unspecified lump in breast: Secondary | ICD-10-CM | POA: Diagnosis not present

## 2015-12-25 DIAGNOSIS — N6310 Unspecified lump in the right breast, unspecified quadrant: Secondary | ICD-10-CM

## 2015-12-25 NOTE — Progress Notes (Signed)
Patient scheduled for R Breast Diagnostic Mammogram and R Breast UItrasound at [facility]  12/26/15 at 0815.

## 2015-12-25 NOTE — Progress Notes (Signed)
GYNECOLOGY  VISIT   HPI: 60 y.o. G1P1 Married Caucasian female with complaint of new palpable breast mass that she found in the shower.  She's not sure it is a mass or if it is just normal breast tissue that she is now feeling.  Denies pain.  Denies trauma or nipple discharge.  She admits to feeling anxious about this finding.    Feeling fine.  Energy is good.  No fevers.    Last MMG:  09/25/15.  Was with 3D.  Family hx:  Breast cancer MGM.  Endometrial cancer in sister.  We have discussed genetic testing for Lynch due to endometrial cancer in sister and pt.  She declined.  GYNECOLOGIC HISTORY: Patient's last menstrual period was 09/27/2010. Contraception: hysterectomy Menopausal hormone therapy: none  Patient Active Problem List   Diagnosis Date Noted  . Cholelithiasis 10/13/2013  . Choledocholithiasis 10/13/2013  . Acute biliary pancreatitis 10/09/2013  . Endometrial adenocarcinoma ([place]) 03/16/2012    Past Medical History  Diagnosis Date  . GERD (gastroesophageal reflux disease)   . Vertigo     associated with anesthesia  . Endometrioid adenocarcinoma     Stage IA, grade 2  . Complication of anesthesia     vertigo  . PONV (postoperative nausea and vomiting)   . Vitamin D deficiency 09/2012  . Congenital absence of femur     right  . 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 Cesarean section    . Replacement total knee      Left  . Abdominal hysterectomy  10/08/2010    Robotic, BSO, bilateral pelvic/ R periaortic LND  . Cholecystectomy    . Cholecystectomy N/A 10/11/2013    Procedure: LAPAROSCOPIC CHOLECYSTECTOMY WITH INTRAOPERATIVE CHOLANGIOGRAM;  Surgeon: Joyice Faster. Cornett, MD;  Location: [facility];  Service: General;  Laterality: N/A;  . Ercp N/A 10/12/2013    Procedure: ENDOSCOPIC RETROGRADE CHOLANGIOPANCREATOGRAPHY (ERCP);  Surgeon: Missy Sabins, MD;  Location: [facility] ENDOSCOPY;  Service:  Endoscopy;  Laterality: N/A;    MEDS:  Reviewed in EPIC and UTD  ALLERGIES: Ambien; Codeine; and Penicillins  Family History  Problem Relation Age of Onset  . Colon cancer Mother   . Colon cancer Father   . Asthma Paternal Grandmother   . Breast cancer Maternal Grandmother     SH: married and non-smoker  Review of Systems  All other systems reviewed and are negative.   PHYSICAL EXAMINATION:    BP 140/92 mmHg  Pulse 80  Resp 18  Ht 5' 4.5" (1.638 m)  Wt 192 lb (87.091 kg)  BMI 32.46 kg/m2  LMP 09/27/2010     Physical Exam  Constitutional: She appears well-developed and well-nourished.  HENT:  Head: Normocephalic and atraumatic.  Neck: Normal range of motion. Neck supple. No thyromegaly present.  Cardiovascular: Normal rate and regular rhythm.   Pulmonary/Chest: Effort normal and breath sounds normal. Right breast exhibits mass. Right breast exhibits no inverted nipple, no nipple discharge, no skin change and no tenderness. Left breast exhibits no inverted nipple, no mass, no nipple discharge, no skin change and no tenderness. Breasts are symmetrical.    Lymphadenopathy:    She has no cervical adenopathy.  Skin: Skin is warm and dry.  Psychiatric: She has a normal mood and affect.     Pt and I talked back and forth about repeat imaging as she just had a MMG in late  December and I am not convinced this is an actual mass.  Due to famlily hx and her personal cancer hx, feel prudent to image and not wait.  Pt in agreement.    Assessment: ~1cm breast nodule on right  Plan: Right diagnostic MMG will be planned.  Pt will be called about results are back.

## 2015-12-26 ENCOUNTER — Telehealth: Payer: Self-pay | Admitting: Obstetrics & Gynecology

## 2015-12-26 NOTE — Telephone Encounter (Signed)
Return call to patient. States she had MMG today and she wanted to speak with Dr Sabra Heck personally and provide update as to what is going on. Denies any needs I can help her with, will just wait to her from Dr Sabra Heck if and when she has time.

## 2015-12-26 NOTE — Telephone Encounter (Signed)
Patient would like to talk to An[place]mo about her MMG result from today. I told patient Dr.Miller's nurse would need to return her call.

## 2015-12-27 ENCOUNTER — Encounter: Payer: Self-pay | Admitting: Obstetrics & Gynecology

## 2015-12-31 ENCOUNTER — Other Ambulatory Visit: Payer: Self-pay | Admitting: Radiology

## 2015-12-31 NOTE — Telephone Encounter (Signed)
Dr Sabra Heck spoke with patient directly. Encounter closed.

## 2016-01-02 ENCOUNTER — Telehealth: Payer: Self-pay | Admitting: *Deleted

## 2016-01-02 NOTE — Telephone Encounter (Signed)
Left vm for pt to return call regarding [place] appt for 4.12.17. Contact information provided.

## 2016-01-05 ENCOUNTER — Telehealth: Payer: Self-pay | Admitting: Obstetrics & Gynecology

## 2016-01-05 NOTE — Telephone Encounter (Signed)
Return call to patient. States she spoke to Dr Sabra Heck last week and she is now calling to discuss results with Dr Sabra Heck. She is available anytime this week.

## 2016-01-05 NOTE — Telephone Encounter (Signed)
Patient called to advised she is in receipt of her biopsy results. Patient would like to share result information with with our office. Best number to reach patient is [phone]. Routing to Clinical Triage for review

## 2016-01-06 ENCOUNTER — Telehealth: Payer: Self-pay | Admitting: *Deleted

## 2016-01-06 NOTE — Telephone Encounter (Signed)
Dr. Sabra Heck, I spoke with Bary Castilla, the breast cancer clinic nurse navigator and she stated that Dr. Jana Hakim next appointment in clinic is 01/14/16 and that Dr. Lucia Gaskins is the surgeon that day.  She stated that we can also schedule two separate appointments, one with Dr. Jana Hakim and one with Dr. Donne Hazel in his office.

## 2016-01-06 NOTE — Telephone Encounter (Signed)
Late entry- 01/02/16 Reached pt to discuss [place] on 01/07/16. Pt relate she wishes to "do some research" and wishes not to participate in [facility] at this time.

## 2016-01-06 NOTE — Telephone Encounter (Signed)
Tried to call pt x 2.  Phone just rang and no answer or voicemail.  I would like her to go on the day of the clinic 4/19 and see Dr. Jana Hakim and Dr. Lucia Gaskins.  Please make appt for pt.  I will be happy to talk with her if needed this week.

## 2016-01-06 NOTE — Telephone Encounter (Signed)
Can you please call the [facility] to see if Dr. Jana Hakim or Dr. Donne Hazel have upcoming days when they are part of the team giving care to new breast cancer pt's.  Pt needs appt but wants to see specific providers.  Thanks.

## 2016-01-07 NOTE — Telephone Encounter (Signed)
Routing to Agency who called about appt.

## 2016-01-07 NOTE — Telephone Encounter (Signed)
Call to patient and she is given message from Dr. Sabra Heck.  She is agreeable to see Dr. Lucia Gaskins and Dr. Jana Hakim.  She will call Bary Castilla to schedule directly at [phone] and advised to return call if any concerns regarding appointment.

## 2016-01-08 ENCOUNTER — Encounter: Payer: Self-pay | Admitting: *Deleted

## 2016-01-08 ENCOUNTER — Telehealth: Payer: Self-pay | Admitting: *Deleted

## 2016-01-08 DIAGNOSIS — C50111 Malignant neoplasm of central portion of right female breast: Secondary | ICD-10-CM

## 2016-01-08 HISTORY — DX: Malignant neoplasm of central portion of right female breast: C50.111

## 2016-01-08 NOTE — Telephone Encounter (Signed)
Confirmed [facility] for 01/14/16 at 0815.  Instructions and contact information given.

## 2016-01-13 NOTE — Telephone Encounter (Signed)
Dr. Sabra Heck, patient is scheduled for Breast Clinic for 01/14/16 at 0830 with Dr. Jana Hakim and Dr. Lucia Gaskins.

## 2016-01-14 ENCOUNTER — Encounter: Payer: Self-pay | Admitting: Oncology

## 2016-01-14 ENCOUNTER — Encounter: Payer: Self-pay | Admitting: Nurse Practitioner

## 2016-01-14 ENCOUNTER — Encounter: Payer: Self-pay | Admitting: *Deleted

## 2016-01-14 ENCOUNTER — Ambulatory Visit: Payer: BC Managed Care – PPO | Attending: Surgery | Admitting: Physical Therapy

## 2016-01-14 ENCOUNTER — Ambulatory Visit
Admission: RE | Admit: 2016-01-14 | Discharge: 2016-01-14 | Disposition: A | Discharge status: Home / Self Care | Payer: BC Managed Care – PPO | Source: Ambulatory Visit | Attending: Radiation Oncology | Admitting: Radiation Oncology

## 2016-01-14 ENCOUNTER — Other Ambulatory Visit: Payer: Self-pay | Admitting: Surgery

## 2016-01-14 ENCOUNTER — Ambulatory Visit (HOSPITAL_BASED_OUTPATIENT_CLINIC_OR_DEPARTMENT_OTHER): Payer: BC Managed Care – PPO | Admitting: Oncology

## 2016-01-14 ENCOUNTER — Encounter: Payer: Self-pay | Admitting: Physical Therapy

## 2016-01-14 ENCOUNTER — Encounter: Payer: Self-pay | Admitting: Skilled Nursing Facility1

## 2016-01-14 ENCOUNTER — Other Ambulatory Visit (HOSPITAL_BASED_OUTPATIENT_CLINIC_OR_DEPARTMENT_OTHER): Payer: BC Managed Care – PPO

## 2016-01-14 ENCOUNTER — Telehealth: Payer: Self-pay | Admitting: Oncology

## 2016-01-14 VITALS — BP 145/67 | HR 80 | Temp 97.8°F | Resp 18 | Ht 64.5 in | Wt 191.2 lb

## 2016-01-14 DIAGNOSIS — C50111 Malignant neoplasm of central portion of right female breast: Secondary | ICD-10-CM

## 2016-01-14 DIAGNOSIS — C50911 Malignant neoplasm of unspecified site of right female breast: Secondary | ICD-10-CM | POA: Insufficient documentation

## 2016-01-14 DIAGNOSIS — R293 Abnormal posture: Secondary | ICD-10-CM

## 2016-01-14 DIAGNOSIS — C50411 Malignant neoplasm of upper-outer quadrant of right female breast: Secondary | ICD-10-CM

## 2016-01-14 DIAGNOSIS — R2689 Other abnormalities of gait and mobility: Secondary | ICD-10-CM | POA: Diagnosis present

## 2016-01-14 DIAGNOSIS — H8110 Benign paroxysmal vertigo, unspecified ear: Secondary | ICD-10-CM | POA: Diagnosis not present

## 2016-01-14 LAB — COMPREHENSIVE METABOLIC PANEL
ALT: 23 U/L (ref 0–55)
ANION GAP: 9 meq/L (ref 3–11)
AST: 24 U/L (ref 5–34)
Albumin: 3.8 g/dL (ref 3.5–5.0)
Alkaline Phosphatase: 102 U/L (ref 40–150)
BUN: 10.3 mg/dL (ref 7.0–26.0)
CHLORIDE: 111 meq/L — AB (ref 98–109)
CO2: 25 meq/L (ref 22–29)
Calcium: 9.2 mg/dL (ref 8.4–10.4)
Creatinine: 0.9 mg/dL (ref 0.6–1.1)
EGFR: 71 mL/min/{1.73_m2} — AB (ref >90)
Glucose: 78 mg/dl (ref 70–140)
Potassium: 4.5 mEq/L (ref 3.5–5.1)
Sodium: 144 mEq/L (ref 136–145)
Total Bilirubin: 0.75 mg/dL (ref 0.20–1.20)
Total Protein: 7.3 g/dL (ref 6.4–8.3)

## 2016-01-14 LAB — CBC WITH DIFFERENTIAL/PLATELET
BASO%: 0.5 % (ref 0.0–2.0)
Basophils Absolute: 0 10*3/uL (ref 0.0–0.1)
EOS%: 3 % (ref 0.0–7.0)
Eosinophils Absolute: 0.2 10*3/uL (ref 0.0–0.5)
HCT: 41.3 % (ref 34.8–46.6)
HGB: 13.4 g/dL (ref 11.6–15.9)
LYMPH%: 27.4 % (ref 14.0–49.7)
MCH: 29.1 pg (ref 25.1–34.0)
MCHC: 32.4 g/dL (ref 31.5–36.0)
MCV: 89.6 fL (ref 79.5–101.0)
MONO#: 0.5 10*3/uL (ref 0.1–0.9)
MONO%: 8.9 % (ref 0.0–14.0)
NEUT#: 3.4 10*3/uL (ref 1.5–6.5)
NEUT%: 60.2 % (ref 38.4–76.8)
PLATELETS: 276 10*3/uL (ref 145–400)
RBC: 4.61 10*6/uL (ref 3.70–5.45)
RDW: 13.3 % (ref 11.2–14.5)
WBC: 5.7 10*3/uL (ref 3.9–10.3)
lymph#: 1.6 10*3/uL (ref 0.9–3.3)

## 2016-01-14 NOTE — Therapy (Signed)
[place], [place], [address] Phone: [phone]   Fax:  [phone]  Physical Therapy Evaluation  Patient Details  Name: Raven Marks MRN: [id number] Date of Birth: 05/10/1956 Referring Provider: Dr. Alphonsa Overall  Encounter Date: 01/14/2016      PT End of Session - 01/14/16 1419    Visit Number 1   Number of Visits 1   PT Start Time 3903   PT Stop Time 1211   PT Time Calculation (min) 26 min   Activity Tolerance Patient tolerated treatment well   Behavior During Therapy [facility] for tasks assessed/performed      Past Medical History  Diagnosis Date  . GERD (gastroesophageal reflux disease)   . Vertigo     associated with anesthesia  . Endometrioid adenocarcinoma     Stage IA, grade 2  . Complication of anesthesia     vertigo  . PONV (postoperative nausea and vomiting)   . Vitamin D deficiency 09/2012  . Congenital absence of femur     right  . 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 Cancer of central portion of right female breast ([place]) 01/08/2016    Past Surgical History  Procedure Laterality Date  . Cesarean section    . Replacement total knee      Left  . Abdominal hysterectomy  10/08/2010    Robotic, BSO, bilateral pelvic/ R periaortic LND  . Cholecystectomy    . Cholecystectomy N/A 10/11/2013    Procedure: LAPAROSCOPIC CHOLECYSTECTOMY WITH INTRAOPERATIVE CHOLANGIOGRAM;  Surgeon: Joyice Faster. Cornett, MD;  Location: [facility];  Service: General;  Laterality: N/A;  . Ercp N/A 10/12/2013    Procedure: ENDOSCOPIC RETROGRADE CHOLANGIOPANCREATOGRAPHY (ERCP);  Surgeon: Missy Sabins, MD;  Location: [facility] ENDOSCOPY;  Service: Endoscopy;  Laterality: N/A;    There were no vitals filed for this visit.       Subjective Assessment - 01/14/16 1522    Subjective Patient reports she is here to be seen by her medical team for her newly diagnosed right breast cancer.   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  The mass measures 1.1 cm with a ki67 of 60%.  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  She ambulates with a cane and is unable to find an exercise that she can tolerate wiht hre prosthesis.   Patient Stated Goals Reduce lymphedema risk and learn post op shoulder ROM HEP            [facility] PT Assessment - 01/14/16 0001    Assessment   Medical Diagnosis Right breast cancer   Referring Provider Dr. Alphonsa Overall   Onset Date/Surgical Date 12/31/15   Hand Dominance Right   Prior Therapy none   Precautions   Precautions Other (comment)   Precaution Comments Active breast cancer; vertigo, fall risk due to rt leg prosthesis   Restrictions   Weight Bearing Restrictions No   Balance Screen   Has the patient fallen in the past 6 months No   Has the patient had a decrease in activity level because of a fear of falling?  No   Is the patient reluctant to leave their home because of a fear of falling?  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   Leisure She is unable to exercise due to her prosthesis per her report   Cognition   Overall Cognitive Status Within Functional Limits for tasks assessed   Posture/Postural Control   Posture/Postural Control Postural limitations   Postural Limitations Rounded Shoulders;Forward head;Increased thoracic kyphosis   ROM / Strength   AROM / PROM / Strength AROM;Strength   AROM   AROM Assessment Site Shoulder    Right/Left Shoulder Right;Left   Right Shoulder Extension 40 Degrees   Right Shoulder Flexion 145 Degrees   Right Shoulder ABduction 146 Degrees   Right Shoulder Internal Rotation 59 Degrees   Right Shoulder External Rotation 82 Degrees   Left Shoulder Extension 52 Degrees   Left Shoulder Flexion 145 Degrees   Left Shoulder ABduction 133 Degrees   Left Shoulder Internal Rotation 59 Degrees   Left Shoulder External Rotation 84 Degrees   Strength   Overall Strength Within functional limits for tasks performed           LYMPHEDEMA/ONCOLOGY QUESTIONNAIRE - 01/14/16 1417    Type   Cancer Type Right breast cancer   Lymphedema Assessments   Lymphedema Assessments Upper extremities   Right Upper Extremity Lymphedema   10 cm Proximal to Olecranon Process 30.8 cm   Olecranon Process 25.3 cm   10 cm Proximal to Ulnar Styloid Process 24.1 cm   Just Proximal to Ulnar Styloid Process 16.3 cm   Across Hand at [employer] 19.6 cm   At [place] of 2nd Digit 6.8 cm   Left Upper Extremity Lymphedema   10 cm Proximal to Olecranon Process 33.1 cm   Olecranon Process 25.4 cm   10 cm Proximal to Ulnar Styloid Process 24 cm   Just Proximal to Ulnar Styloid Process 15.9 cm   Across Hand at [employer] 19.4 cm   At [place] of 2nd Digit 6.5 cm      Patient was instructed today in a home exercise program today for post op shoulder range of motion. These included active assist shoulder flexion in sitting, scapular retraction, wall walking with shoulder abduction, and hands behind head external rotation.  She was encouraged to do these twice a day, holding 3 seconds and repeating 5 times when permitted by her physician.         PT Education - 01/14/16 1418    Education provided Yes   Education Details Lymphedema risk reduction and post op shoulder ROM HEP   Person(s) Educated Patient;Spouse   Methods Explanation;Demonstration;Handout   Comprehension Verbalized understanding;Returned  demonstration              Breast Clinic Goals - 01/14/16 1530    Patient will be able to verbalize understanding of pertinent lymphedema risk reduction practices relevant to her diagnosis specifically related to skin care.   Time 1   Period Days   Status Achieved   Patient will be able to return demonstrate and/or verbalize understanding of the post-op home exercise program related to regaining shoulder range of motion.   Time 1   Period Days   Status Achieved   Patient will be able to verbalize understanding of the importance of attending the postoperative After Breast Cancer  Class for further lymphedema risk reduction education and therapeutic exercise.   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  The mass measures 1.1 cm with a ki67 of 60%.  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  She ambulates with a cane and is unable to find an exercise that she can tolerate with her prosthesis.  She has been referred to PT for vestibular rehab but plans to wait until after surgery to go.  She is planning to have a right lumpectomy with a sentinel node biopsy followed by chemotherapy, radiation and anti-estrogen therapy.  She may benefit from post op PT to regain shoulder ROM and reduce lymphedema risk.   Rehab Potential Excellent   Clinical Impairments Affecting Rehab Potential comorbidities   PT Frequency One time visit   PT Treatment/Interventions Therapeutic exercise;Patient/family education   PT Next Visit Plan Will f/u after surgery   PT Home Exercise Plan Post op shoulder ROM HEP   Consulted and Agree with Plan of Care Patient;Family member/caregiver   Family Member Consulted Husband      Patient will benefit from skilled therapeutic  intervention in order to improve the following deficits and impairments:  Decreased strength, Pain, Decreased knowledge of precautions, Impaired UE functional use, Decreased range of motion  Visit Diagnosis: Breast cancer, female, right - Plan: PT plan of care cert/re-cert  Other abnormalities of gait and mobility - Plan: PT plan of care cert/re-cert  Abnormal posture - Plan: PT plan of care cert/re-cert   Patient will follow up at outpatient cancer rehab if needed following surgery.  If the patient requires physical therapy at that time, a specific plan will be dictated and sent to the referring physician for approval. The patient was educated today on appropriate basic range of motion exercises to begin post operatively and the importance of attending the After Breast Cancer class following surgery.  Patient was educated today on lymphedema risk reduction practices as it pertains to recommendations that will benefit the patient immediately following surgery.  She verbalized good understanding.  No additional physical therapy is indicated at this time.      Problem List Patient Active Problem List   Diagnosis Date Noted  . Breast cancer of upper-outer quadrant of right female breast ([place]) 01/08/2016  . Cholelithiasis 10/13/2013  . Choledocholithiasis 10/13/2013  . Acute biliary pancreatitis 10/09/2013  . Endometrial adenocarcinoma ([place]) 03/16/2012    Annia Friendly, PT 01/14/2016 3:33 PM  [place], [place], [address] Phone: [phone]   Fax:  [phone]  Name: Raven Marks MRN: [id number] Date of Birth: [birth year]

## 2016-01-14 NOTE — Progress Notes (Signed)
Radiation Oncology         [phone]) [phone] ________________________________  Initial outpatient Consultation - Date: 01/14/2016   Name: Raven Marks MRN: [id number]   DOB: 06-09-1956  REFERRING PHYSICIAN: Alphonsa Overall, MD  DIAGNOSIS AND STAGE: T1c N0 invasive ductal carcinoma of the right breast.   HISTORY OF PRESENT ILLNESS::Raven Marks is a 60 y.o. female who initially had a negative mammogram in December 2016. She subsequently palpated a right breast mass and went back for repeat imaging on 12/26/2015. This mammogram showed a 1.1 cm mass. Ultrasound confirmed a 1.1 cm mass in the central right breast. Biopsy performed on 12/31/2015 revealed a Grade 3 invasive ductal carcinoma that was ER/PR positive with a Ki67 of 60%. This was found to be Her2-neu positive. She has a past medical history of endometrial cancer as well as a family history of colon and breast cancer. She has been recommended for genetic testing to determine Lynch syndrome status. She is accompanied by her husband today.   Today, she has expressed concerns about how chemotherapy will affect her Vertigo.   PREVIOUS RADIATION THERAPY: Yes; Vaginal Cuff Radiation by Dr. Sondra Come  Gynecologic History:  Age at first menstrual period? N/A Still having periods? No  Approximate date of last period? N/A Ever used hormone replacement? No Obstetric History:  How many children have you carried to term? 1 Age at first live birth? 63 Pregnant now or trying to get pregnant? No Ever used birth control pills or hormone shots for contraception? Yes If so, for how long: 1 year Health Maintenance:  Have you ever had a colonoscopy? Yes If yes, date: N/A  Have you ever had a bone density? Yes  If yes, date: N/A Date of your last PAP smear? 01-17  Date of your FIRST mammogram? N/A  Past medical, social and family history were reviewed in the electronic chart. Review of symptoms was reviewed in the electronic chart. Medications  were reviewed in the electronic chart.   PHYSICAL EXAM: BP: 145/67 mmHg, Pulse Rate: 80, Resp: 18, Temp: 97.8 F, Temp Source: Oral, SpO2: 100%, Weight, 191 lb 3.2 oz, Height 5' 4.5"  This is a pleasant, well appearing female in no acute distress. She is alert and oriented. Palpable mass is noted posterior to the nipple in the right breast with a biopsy site in the upper-outer quadrant. No palpable abnormalities in the left breast. No palpable axillary, supraclavicular, or cervial adenopathy.   IMPRESSION: Raven Marks is a 60 y.o female with T1c N0 invasive ductal carcinoma of the right breast.  PLAN: I spoke to the patient today regarding her diagnosis and options for treatment. We discussed the equivalence in terms of survival and local failure between mastectomy and breast conservation. We discussed the role of radiation in decreasing local failures in patients who undergo lumpectomy. We discussed the process of simulation and the placement tattoos. We discussed 6 weeks of treatment as an outpatient. We discussed the possibility of asymptomatic lung damage. We discussed the low likelihood of secondary malignancies. We discussed the possible side effects including but not limited to skin redness, fatigue, permanent skin darkening, and breast swelling.   We discussed that she would need chemotherapy either before surgery or after surgery and discussed the differences between chemotherapy and radiation. We discussed genetic counseling for Lynch syndrome testing and she is interested in pursuing that.   I would be happy to see [name] back after her surgery, or she can return to Dr. Sondra Come who  performed her radiation for endometrial cancer.  I spent 40 minutes face to face with the patient and more than 50% of that time was spent in counseling and/or coordination of care.   ------------------------------------------------  Thea Silversmith, MD  This document serves as a record of services  personally performed by Thea Silversmith, MD. It was created on her behalf by Jenell Milliner, a trained medical scribe. The creation of this record is based on the scribe's personal observations and the provider's statements to them. This document has been checked and approved by the attending provider.

## 2016-01-14 NOTE — Progress Notes (Signed)
[facility] Psychosocial Distress Screening Clinical Social Work  Clinical Social Work met with pt and her husband at [facility] [place] to introduce self, explain role of Pt and [place] and to was review distress screening protocol. The patient scored a 1 on the Psychosocial Distress Thermometer which indicates no distress. Pt reports she is doing well currently with her diagnosis and was treated here for ovarian cancer in the past. She is aware of support programs and has a good support network. She is not stressed, but more aggravated that she has to go through treatment again. 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 Therefore, work is a concern for her.  Pt denied current needs, but was appreciative of information. Pt and husband agree to reach out as needed.    ONCBCN DISTRESS SCREENING 01/14/2016  Screening Type Initial Screening  Distress experienced in past week (1-10) 1  Practical problem type Work/school  Emotional problem type Adjusting to illness  Referral to support programs Yes    Clinical Social Worker follow up needed: No.  If yes, follow up plan: Loren Racer, [place]  [facility] Phone: [phone] Fax: [phone]

## 2016-01-14 NOTE — Telephone Encounter (Signed)
sch chemo teaching for 4/20 per 4/19 pof

## 2016-01-14 NOTE — Patient Instructions (Signed)

## 2016-01-14 NOTE — Progress Notes (Signed)
Ms. Coddington is a very pleasant 60 y.o. female from [place], [place] with newly diagnosed grade 2-3 invasive ductal carcinoma of the  Right breast.  Biopsy results revealed the tumor's prognostic profile is ER positive, PR positive, and HER2/neu positive. Ki67 is 60%.  She presents today with her husband to the [facility]) for treatment consideration and recommendations from the breast surgeon, radiation oncologist, and medical oncologist.     I briefly met with Ms. Bohnet and her husband during her [facility] visit today. We discussed the purpose of the Survivorship Clinic, which will include monitoring for recurrence, coordinating completion of age and gender-appropriate cancer screenings, promotion of overall wellness, as well as managing potential late/long-term side effects of anti-cancer treatments.    The treatment plan for Ms. Amodei will likely include surgery, chemotherapy, radiation therapy, and anti-estrogen therapy.  She will meet with the Genetics Counselor due to her family history of breast cancer. As of today, the intent of treatment for Ms. Mcgrady is cure, therefore she will be eligible for the Survivorship Clinic upon her completion of treatment.  Her survivorship care plan (SCP) document will be drafted and updated throughout the course of her treatment trajectory. She will receive the SCP in an office visit with myself in the Survivorship Clinic once she has completed treatment.   Ms. Stanbrough was encouraged to ask questions and all questions were answered to her satisfaction.  She was given my business card and encouraged to contact me with any concerns regarding survivorship.  I look forward to participating in her care.   Kenn File, [place] [phone]

## 2016-01-14 NOTE — Progress Notes (Signed)
Subjective:     Patient ID: Raven Marks, female   DOB: 05-Feb-1956, 61 y.o.   MRN: [id number]  HPI   Review of Systems     Objective:   Physical Exam For the patient to understand and be given the tools to implement a healthy plant based diet during their cancer diagnosis.     Assessment:     Patient was seen today and found to be determined and accompanied by her seemingly supportive husband. Pts labs: chloride 111, GFR 71. Pts ht 5'4'', 191 pounds, BMI 32.4. Pts medications beta carotene. Hx of cholelithiasis, acute bilateral pancreatitis. Pt is very interested in eating in a way that will decrease her reoccurrence rates and as healthy as possible. Pt states she cut out all sugar because she heard sugar feeds cancer.      Plan:     Dietitian educated the patient on implementing a plant based diet by incorporating more plant proteins, fruits, and vegetables. As a part of a healthy routine physical activity was discussed. Dietitian gave specific education on carbohydrates and sugar. The importance of legitimate, evidence based information was discussed and examples were given. A folder of evidence based information with a focus on a plant based diet and general nutrition during cancer was given to the patient.  As a part of the continuum of care the cancer dietitian's contact information was given to the patient in the event they would like to have a follow up appointment.

## 2016-01-14 NOTE — Progress Notes (Signed)
Raven Marks  [phone] [phone]     ID: SEONA CLEMENSON DOB: 18-Sep-1956  MR#: [id number]  PYP#:[id number]  Patient Care Team: Lona Kettle, MD as PCP - General (Family Medicine) Megan Salon, MD as Consulting Physician (Gynecology) PCP:  Melinda Crutch, MD GYN: SU:  OTHER MD:  CHIEF COMPLAINT:   CURRENT TREATMENT:    BREAST CANCER HISTORY: Raven Marks has a history of endometrial carcinoma and underwent robotic hysterectomy with bilateral salpingo-oophorectomy and pelvic and right para-aortic lymph node dissection 10/07/2010. This showed a stage IB, grade 2 endometrioid adenocarcinoma. Washings were negative. A total of 12 lymph nodes were removed, all clear. She received adjuvant brachytherapy. He has been no evidence of disease recurrence.  More recently, mammography 09/25/2015 at [facility] was negative. However in March 2017 the patient herself palpated a lump in her right breast just above the nipple and right diagnostic mammography with tomosynthesis and right breast ultrasonography at [facility] 12/26/2015 showed a 1.1 cm oval mass in the right breast central to the nipple. There were no associated calcifications. Breast density was category C. On ultrasound there was a 1.1 cm lobulated solid mass in the right breast as previously described. The right axilla was benign.  Biopsy of the right breast mass in question 12/31/2015 showed (SAA 17-06/11/2005) invasive ductal carcinoma, grade 2 or 3, estrogen receptor 100% positive, progesterone receptor 100% positive, both with strong staining intensity, with an MIB-1 of 60%, and HER-2 amplified with a signals ratio of 2.11, the number per cell being 4.43  Her subsequent history is as detailed below  INTERVAL HISTORY: Raven Marks was evaluated in the multidisciplinary breast cancer clinic 01/14/2016 accompanied by her husband [name]. Her case was also presented in the multidisciplinary breast cancer conference that same  morning. At that time a preliminary plan was proposed: Breast conserving surgery, with adjuvant chemotherapy and anti-HER-2 treatment, versus neoadjuvant treatment. Recommendation for genetics testing was also made. The patient would also benefit from adjuvant radiation.  REVIEW OF SYSTEMS: There were no specific symptoms leading to the original mammogram, which was routinely scheduled. The patient denies unusual headaches, visual changes, nausea, vomiting, stiff neck, dizziness, or gait imbalance. There has been no cough, phlegm production, or pleurisy, no chest pain or pressure, and no change in bowel or bladder habits. The patient denies fever, rash, bleeding, unexplained fatigue or unexplained weight loss. 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 Currently she is also having left knee problems which limits her ambulation and her ability to exercise. Other complaints including dry eye, seasonal allergies, heartburn, and chronic low back and joint pain which is not more intense or persistent than usual. A detailed review of systems was otherwise entirely negative.  PAST MEDICAL HISTORY: Past Medical History  Diagnosis Date  . GERD (gastroesophageal reflux disease)   . Vertigo     associated with anesthesia  . Endometrioid adenocarcinoma     Stage IA, grade 2  . Complication of anesthesia     vertigo  . PONV (postoperative nausea and vomiting)   . Vitamin D deficiency 09/2012  . Congenital absence of femur     right  . 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 Cancer of central portion of right female breast ([place]) 01/08/2016    PAST SURGICAL HISTORY: Past Surgical History  Procedure Laterality Date  .  Cesarean section    . Replacement total knee      Left  . Abdominal hysterectomy  10/08/2010    Robotic, BSO, bilateral pelvic/ R periaortic LND   . Cholecystectomy    . Cholecystectomy N/A 10/11/2013    Procedure: LAPAROSCOPIC CHOLECYSTECTOMY WITH INTRAOPERATIVE CHOLANGIOGRAM;  Surgeon: Joyice Faster. Cornett, MD;  Location: [place];  Service: General;  Laterality: N/A;  . Ercp N/A 10/12/2013    Procedure: ENDOSCOPIC RETROGRADE CHOLANGIOPANCREATOGRAPHY (ERCP);  Surgeon: Missy Sabins, MD;  Location: [facility] ENDOSCOPY;  Service: Endoscopy;  Laterality: N/A;    FAMILY HISTORY Family History  Problem Relation Age of Onset  . Colon cancer Mother   . Colon cancer Father   . Asthma Paternal Grandmother   . Breast cancer Maternal Grandmother   . Breast cancer Maternal Aunt   The patient's parents both died from colon cancer, her father at age 51 and her mother at age 61. The patient had no brothers. She had 2 sisters. A maternal aunt was diagnosed with breast cancer in her late 18s.  GYNECOLOGIC HISTORY:  Patient's last menstrual period was 09/27/2010. She is GX P1, first live birth age 56. The patient was already in menopause when she was found to have endometrial cancer and underwent robotic hysterectomy and bilateral salpingo-oophorectomy with lymphadenectomy January 2012. 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 Her husband [name] is an Chief Financial Officer. Their son Jeralyn Ruths, lives in [place], also is an Chief Financial Officer. The patient attends the [employer]. She has 2 grandchildren     ADVANCED DIRECTIVES: Not in place   HEALTH MAINTENANCE: Social History  Substance Use Topics  . Smoking status: Never Smoker   . Smokeless tobacco: Never Used  . Alcohol Use: No     Colonoscopy: UTD/ Buccini  PAP: UTD/ Miller  Bone density: 07/02/2014 at [place], T equals -1.1  Lipid panel:  Allergies  Allergen Reactions  . Codeine Nausea Only  . Penicillins Rash    Patient is unsure of reaction because her mother informed her of this allergy from when  she was young.     Current Outpatient Prescriptions  Medication Sig Dispense Refill  . beta carotene w/minerals (OCUVITE) tablet Take 1 tablet by mouth daily.    . Loratadine (CLARITIN PO) Take 10 mg by mouth.    . meclizine (ANTIVERT) 50 MG tablet Take 1 tablet (50 mg total) by mouth 3 (three) times daily as needed. 30 tablet 2  . omeprazole (PRILOSEC) 10 MG capsule Take 10 mg by mouth daily.    Vladimir Faster Glycol-Propyl Glycol (SYSTANE OP) Apply to eye 3 (three) times daily.     No current facility-administered medications for this visit.    OBJECTIVE:Middle-aged white woman in no acute distress  Filed Vitals:   01/14/16 0859  BP: 145/67  Pulse: 80  Temp: 97.8 F (36.6 C)  Resp: 18     Body mass index is 32.32 kg/(m^2).    ECOG FS:1 - Symptomatic but completely ambulatory  Ocular: Sclerae unicteric, pupils equal, round and reactive to light Ear-nose-throat: Oropharynx clear and moist Lymphatic: No cervical or supraclavicular adenopathy Lungs no rales or rhonchi, good excursion bilaterally Heart regular rate and rhythm, no murmur appreciated Abd soft, nontender, positive bowel sounds MSK no focal spinal tenderness, no joint edema, right leg prosthesis  Neuro: non-focal, well-oriented, appropriate affect Breasts: The right breast is status post recent biopsy. There is  a moderate ecchymosis. There is a palpable mass just behind the nipple. This is hard to [employer]. There is no nipple inversion. The right axilla is benign. The left breast is unremarkable. The left axilla is benign   LAB RESULTS:  CMP     Component Value Date/Time   NA 144 01/14/2016 0845   NA 139 10/12/2013 0115   K 4.5 01/14/2016 0845   K 3.6* 10/12/2013 0115   CL 101 10/12/2013 0115   CO2 25 01/14/2016 0845   CO2 23 10/12/2013 0115   GLUCOSE 78 01/14/2016 0845   GLUCOSE 125* 10/12/2013 0115   BUN 10.3 01/14/2016 0845   BUN 4* 10/12/2013 0115   CREATININE 0.9 01/14/2016 0845   CREATININE 0.77  10/12/2013 0115   CALCIUM 9.2 01/14/2016 0845   CALCIUM 8.5 10/12/2013 0115   PROT 7.3 01/14/2016 0845   PROT 6.5 10/12/2013 0115   ALBUMIN 3.8 01/14/2016 0845   ALBUMIN 3.0* 10/12/2013 0115   AST 24 01/14/2016 0845   AST 125* 10/12/2013 0115   ALT 23 01/14/2016 0845   ALT 233* 10/12/2013 0115   ALKPHOS 102 01/14/2016 0845   ALKPHOS 332* 10/12/2013 0115   BILITOT 0.75 01/14/2016 0845   BILITOT 0.9 10/12/2013 0115   GFRNONAA >90 10/12/2013 0115   GFRAA >90 10/12/2013 0115    INo results found for: SPEP, UPEP  Lab Results  Component Value Date   WBC 5.7 01/14/2016   NEUTROABS 3.4 01/14/2016   HGB 13.4 01/14/2016   HCT 41.3 01/14/2016   MCV 89.6 01/14/2016   PLT 276 01/14/2016      Chemistry      Component Value Date/Time   NA 144 01/14/2016 0845   NA 139 10/12/2013 0115   K 4.5 01/14/2016 0845   K 3.6* 10/12/2013 0115   CL 101 10/12/2013 0115   CO2 25 01/14/2016 0845   CO2 23 10/12/2013 0115   BUN 10.3 01/14/2016 0845   BUN 4* 10/12/2013 0115   CREATININE 0.9 01/14/2016 0845   CREATININE 0.77 10/12/2013 0115      Component Value Date/Time   CALCIUM 9.2 01/14/2016 0845   CALCIUM 8.5 10/12/2013 0115   ALKPHOS 102 01/14/2016 0845   ALKPHOS 332* 10/12/2013 0115   AST 24 01/14/2016 0845   AST 125* 10/12/2013 0115   ALT 23 01/14/2016 0845   ALT 233* 10/12/2013 0115   BILITOT 0.75 01/14/2016 0845   BILITOT 0.9 10/12/2013 0115       No results found for: LABCA2  No components found for: LABCA125  No results for input(s): INR in the last 168 hours.  Urinalysis    Component Value Date/Time   BILIRUBINUR n 01/25/2014 1336   PROTEINUR n 01/25/2014 1336   UROBILINOGEN negative 01/25/2014 1336   NITRITE n 01/25/2014 1336   LEUKOCYTESUR Negative 01/25/2014 1336      ELIGIBLE FOR AVAILABLE RESEARCH PROTOCOL: no  STUDIES: Outside films reviewed  ASSESSMENT: 60 y.o. [place] woman status post right breast biopsy 12/31/2015 for a clinical T1c N0  invasive ductal carcinoma, grade 2 or 3, estrogen and progesterone receptor positive, HER-2 amplified, with an MIB-1 of 60%   (1) breast conserving surgery pending  (2) to start chemotherapy and anti-HER-2 immunotherapy on 02/10/2016  (3) to continue anti-HER-2 immunotherapy for a total of one year  (4) adjuvant radiation to follow chemotherapy  (5) adjuvant anti-estrogens to follow completion of local treatment  (6) history of endometriod adenocarcinoma stage IB, grade 2, status post robotic hysterectomy bilateral  salpingo-oophorectomy with pelvic and right para-aortic lymph node dissection 10/07/2010  (7) genetics testing pending   PLAN: We spent the better part of today's hour-long appointment discussing the biology of breast cancer in general, and the specifics of the patient's tumor in particular. Raven Marks has a good understanding of the difference between local and systemic therapy. She understand as far as local treatment in her case is concerned she is a good candidate for breast conservation followed by radiation. She understands that in terms of survival this is equivalent to mastectomy.  We discussed neoadjuvant chemotherapy. She understands there is no survival advantage or disadvantage to doing chemotherapy first as opposed to following surgery. She has decided to go ahead and have surgery first.  We then discussed adjuvant systemic therapy. She qualifies for all 3 modalities namely anti-estrogen pills, anti-HER-2 immunotherapy, and chemotherapy. The specific chemotherapy will depend from her final surgical results, but if her tumor proves to be stage I then I would treat her only with weekly paclitaxel 12. The anti-HER-2 treatments again assuming she is stage I will be trastuzumab. If it proves to be stage II we will add pertuzumab.  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 She will then return to see [place] to review those results and to discuss supportive medications prior to initiation of systemic treatment.  Because she would like to be able to complete her school year without having to take more than 1 day off at most, she will only receive the anti-HER-2 treatment on 02/10/2016. We will add the chemotherapy at her 03/02/2016 visit. Of course she will continue the anti-HER-2 treatments for a total of one year.  I am concerned about her inability to exercise. I wonder if swimming would be a good option for her. We will check with physical therapy for suggestions in addition, physical therapy can be helpful in terms of her vertigo and a referral for the neuro office has been placed.  Bryant  has a good understanding of the overall plan. She agrees with it. She knows the goal of treatment in her case is cure. She will call with any problems that may develop before her next visit here.  Chauncey Cruel, MD   01/14/2016 10:57 AM Medical Oncology and Hematology [facility] [address] [place], [place] [address] Tel. [phone]    Fax. [phone]

## 2016-01-15 ENCOUNTER — Encounter: Payer: Self-pay | Admitting: Genetic Counselor

## 2016-01-15 ENCOUNTER — Other Ambulatory Visit: Payer: Self-pay

## 2016-01-16 ENCOUNTER — Telehealth: Payer: Self-pay | Admitting: *Deleted

## 2016-01-16 ENCOUNTER — Ambulatory Visit: Payer: BC Managed Care – PPO | Admitting: Physical Therapy

## 2016-01-16 ENCOUNTER — Encounter: Payer: Self-pay | Admitting: Oncology

## 2016-01-16 NOTE — Telephone Encounter (Signed)
Patient requesting appointment with Gypsy Balsam, MD for a second opinion for some time next week. I asked patient what this is regarding and she said Dr. Sabra Heck knows she would like a second opinion. She says things are moving fast and she wants to get a second opinion before she makes a decision. Patient requesting an appointment for next week if possible. Best # to reach: [phone] (Patient can be reached after 4)

## 2016-01-16 NOTE — Telephone Encounter (Signed)
Call again to patient. Unable to leave voicemail on mobile.   Call to home number and spoke with patient.  She is advised that earliest appointment I was able to receive with Dr. Philipp Ovens was 02/09/16. Patient given number to scheduler, Caryl Pina at [facility] [phone] and advised she can call for an earlier appointment with another provider or wait list.  Patient agreeable. She will call and reschedule appointment.  Routing to provider for final review. Patient agreeable to disposition. Will close encounter.

## 2016-01-16 NOTE — Telephone Encounter (Signed)
Referral faxed to Dr. Melvenia Beam office at [facility].

## 2016-01-16 NOTE — Progress Notes (Signed)
left in pod. left for dr. magrinat to sign °

## 2016-01-19 ENCOUNTER — Telehealth: Payer: Self-pay | Admitting: *Deleted

## 2016-01-19 ENCOUNTER — Ambulatory Visit: Payer: BC Managed Care – PPO | Admitting: Physical Therapy

## 2016-01-19 ENCOUNTER — Encounter: Payer: Self-pay | Admitting: Oncology

## 2016-01-19 NOTE — Progress Notes (Signed)
left in pod- left for dr Jana Hakim to sign- called to let patient know forms are ready for pick up at front.  can't leave mess on cell.

## 2016-01-19 NOTE — Progress Notes (Signed)
No ans at home# I mailed forms and copy to medical records

## 2016-01-19 NOTE — Telephone Encounter (Signed)
Left vm for pt to return call regarding Martin from 01/14/16. Contact information provided.

## 2016-01-20 ENCOUNTER — Telehealth: Payer: Self-pay | Admitting: *Deleted

## 2016-01-20 NOTE — Telephone Encounter (Signed)
Left message for a return phone call about cancelling her appointments.  Received message from [facility] that patient is wanting to cancel her appointments. She is going to for a second opinion at [facility].

## 2016-01-23 ENCOUNTER — Telehealth (HOSPITAL_COMMUNITY): Payer: Self-pay | Admitting: Vascular Surgery

## 2016-01-23 ENCOUNTER — Telehealth: Payer: Self-pay | Admitting: *Deleted

## 2016-01-23 NOTE — Telephone Encounter (Signed)
Received message back from patient stating she wants to cancel 5/5, 5/12, and 5/16 appointments.  She is still planning on having surgery 5/26. Called her back and left message that I have cancelled those appointments but left an appointment for 6/6 with Dr. Jana Hakim and informed her to let me know if I need to cancel this appointment as well.

## 2016-01-23 NOTE — Telephone Encounter (Signed)
Spoke to pt to schedule new pt app w/ MD/ w/ Echo. Pt is getting second opinion elsewhere she will call back to schedule

## 2016-01-23 NOTE — Telephone Encounter (Signed)
Left message w/ pt husband to call to scheduled new pt appt

## 2016-01-30 ENCOUNTER — Ambulatory Visit: Payer: Self-pay | Admitting: Nurse Practitioner

## 2016-01-30 ENCOUNTER — Other Ambulatory Visit: Payer: Self-pay

## 2016-02-03 ENCOUNTER — Other Ambulatory Visit: Payer: Self-pay

## 2016-02-03 ENCOUNTER — Ambulatory Visit: Payer: Self-pay

## 2016-02-06 ENCOUNTER — Encounter (HOSPITAL_COMMUNITY): Payer: BC Managed Care – PPO

## 2016-02-06 ENCOUNTER — Ambulatory Visit: Payer: Self-pay | Admitting: Nurse Practitioner

## 2016-02-10 ENCOUNTER — Telehealth: Payer: Self-pay | Admitting: *Deleted

## 2016-02-10 ENCOUNTER — Other Ambulatory Visit: Payer: Self-pay

## 2016-02-10 ENCOUNTER — Ambulatory Visit: Payer: Self-pay

## 2016-02-10 NOTE — Telephone Encounter (Signed)
Received call from patient stating she is cancelling all appointments.  She will be getting her care elsewhere.  Encouraged her that she could still call if she had questions or needed anything. I will cancel appointments and let the team know.

## 2016-02-20 ENCOUNTER — Encounter (HOSPITAL_BASED_OUTPATIENT_CLINIC_OR_DEPARTMENT_OTHER)
Admission: RE | Admit: 2016-02-20 | Discharge: 2016-02-20 | Disposition: A | Discharge status: Home / Self Care | Payer: BC Managed Care – PPO | Source: Ambulatory Visit | Attending: Surgery | Admitting: Surgery

## 2016-02-20 ENCOUNTER — Ambulatory Visit (HOSPITAL_BASED_OUTPATIENT_CLINIC_OR_DEPARTMENT_OTHER): Admission: RE | Admit: 2016-02-20 | Payer: BC Managed Care – PPO | Source: Ambulatory Visit | Admitting: Surgery

## 2016-02-20 ENCOUNTER — Encounter (HOSPITAL_BASED_OUTPATIENT_CLINIC_OR_DEPARTMENT_OTHER): Admission: RE | Payer: Self-pay | Source: Ambulatory Visit

## 2016-02-20 DIAGNOSIS — C50911 Malignant neoplasm of unspecified site of right female breast: Secondary | ICD-10-CM

## 2016-02-20 SURGERY — BREAST LUMPECTOMY WITH RADIOACTIVE SEED AND SENTINEL LYMPH NODE BIOPSY
Anesthesia: General | Site: Breast | Laterality: Right

## 2016-03-02 ENCOUNTER — Other Ambulatory Visit: Payer: Self-pay

## 2016-03-02 ENCOUNTER — Ambulatory Visit: Payer: Self-pay

## 2016-03-02 ENCOUNTER — Ambulatory Visit: Payer: Self-pay | Admitting: Oncology

## 2016-03-09 ENCOUNTER — Other Ambulatory Visit: Payer: Self-pay

## 2016-03-09 ENCOUNTER — Ambulatory Visit: Payer: Self-pay | Admitting: Nurse Practitioner

## 2016-03-09 HISTORY — PX: PARTIAL MASTECTOMY WITH AXILLARY SENTINEL LYMPH NODE BIOPSY: SHX6004

## 2016-03-18 ENCOUNTER — Encounter: Payer: Self-pay | Admitting: Obstetrics & Gynecology

## 2016-03-23 ENCOUNTER — Ambulatory Visit: Payer: Self-pay

## 2016-03-23 ENCOUNTER — Other Ambulatory Visit: Payer: Self-pay

## 2016-03-26 ENCOUNTER — Other Ambulatory Visit: Payer: Self-pay | Admitting: Nurse Practitioner

## 2016-04-08 ENCOUNTER — Telehealth: Payer: Self-pay | Admitting: Emergency Medicine

## 2016-04-08 NOTE — Telephone Encounter (Signed)
Out of hold per Dr. Miller.   

## 2016-04-08 NOTE — Telephone Encounter (Signed)
-----   Message from Megan Salon, MD sent at 04/08/2016  1:01 PM EDT ----- Regarding: RE: Mammogram hold  Yes.  Ok to remove from recall/hold.  Thanks.  Vinnie Level ----- Message -----    From: Michele Mcalpine, RN    Sent: 04/07/2016   1:06 PM      To: Megan Salon, MD Subject: Mammogram hold                                 Dr. Sabra Heck,  Faythe Ghee to remove from mammogram hold. Has completed surgical care at [facility] and notes scanned into EPIC.

## 2016-04-14 DIAGNOSIS — Z1379 Encounter for other screening for genetic and chromosomal anomalies: Secondary | ICD-10-CM | POA: Insufficient documentation

## 2016-08-10 ENCOUNTER — Telehealth: Payer: Self-pay | Admitting: Oncology

## 2016-08-10 NOTE — Telephone Encounter (Signed)
FAXED RECORDS TO [facility]  [phone]

## 2016-09-17 ENCOUNTER — Encounter: Payer: Self-pay | Admitting: Radiation Oncology

## 2016-10-08 NOTE — Progress Notes (Signed)
Location of Breast Cancer: central right breast  Histology per Pathology Report:   03/09/16 A. Right breast mass, central portion of breast, wire guided excision:  Invasive ductal carcinoma of the breast. (See synoptic report for complete details)  - Tumor size: 1.4 cm  - Overall histologic grade: 3  - Surgical margin status: Negative.  Comment: There are no true margins in specimen A.All separate true margins are negative for in situ or invasive carcinoma (specimens B to G: superior, lateral, inferior, medial, posterior).   B. True superior margin, right breast, excision:  Benign breast tissue. No evidence of atypia or carcinoma.   C. True lateral margin, right breast, excision:  Benign breast tissue. No evidence of atypia or carcinoma.   D. True inferior margin, right breast, excision:  Benign breast tissue. No evidence of atypia or carcinoma.   E. True medial margin, right breast, excision:  Benign breast tissue. No evidence of atypia or carcinoma.   F. True posterior margin, right breast, excision:  Benign breast tissue. No evidence of atypia or carcinoma.   G. True posterior margin, right breast, excision:  Benign breast tissue. No evidence of atypia or carcinoma.   H. Sentinel lymph node #1, right axilla, excision:  No evidence of malignancy is seen in one lymph node (0/1).   I. Sentinel lymph node #2 right axilla, excision:  No evidence of malignancy is seen in one lymph node (0/1).   J. Non-sentinel lymph node #1, right axilla, excision:  No evidence of malignancy is seen in one lymph node (0/1).   K. Non-sentinel lymph node #2, right axilla, excision:  No evidence of malignancy is seen in one lymph node (0/1).  12/31/15 Diagnosis Breast, right, needle core biopsy - INVASIVE DUCTAL CARCINOMA, SEE COMMENT.  Receptor Status: ER(100%), PR (100%), Her2-neu (ppsitive), Ki-(60%)  Did patient present with symptoms (if so, please note  symptoms) or was this found on screening mammography?: patient felt a lump herself  Past/Anticipated interventions by surgeon, if any: 03/09/16 - MASTECTOMY, PARTIAL WITH WIRE (EG, LUMPECTOMY, TYLECTOMY, QUADRANTECTOMY, SEGMENTECTOMY) by Dr. Debbe Bales at [facility]  Past/Anticipated interventions by medical oncology, if any: adjuvant maintenance Herceptin q 21 days to complete 1 year of therapy  Lymphedema issues, if any:  no  Pain issues, if any:  no   SAFETY ISSUES:  Prior radiation? Yes - vaginal brachytherapy 11/25/10 - 12/23/10  Pacemaker/ICD? no  Possible current pregnancy?no  Is the patient on methotrexate? no  Current Complaints / other details:  Patient is here with her husband.  BP 139/77 (BP Location: Left Arm, Patient Position: Sitting)   Pulse 66   Temp 97.7 F (36.5 C) (Oral)   Ht 5' 2" (1.575 m)   Wt 188 lb 3.2 oz (85.4 kg)   LMP 09/27/2010   SpO2 100%   BMI 34.42 kg/m    Wt Readings from Last 3 Encounters:  10/11/16 188 lb 3.2 oz (85.4 kg)  10/11/16 186 lb (84.4 kg)  01/14/16 191 lb 3.2 oz (86.7 kg)      Jacqulyn Liner, RN 10/08/2016,8:53 AM

## 2016-10-11 ENCOUNTER — Ambulatory Visit
Admission: RE | Admit: 2016-10-11 | Discharge: 2016-10-11 | Disposition: A | Discharge status: Home / Self Care | Payer: BC Managed Care – PPO | Source: Ambulatory Visit | Attending: Radiation Oncology | Admitting: Radiation Oncology

## 2016-10-11 ENCOUNTER — Encounter: Payer: Self-pay | Admitting: Radiation Oncology

## 2016-10-11 ENCOUNTER — Ambulatory Visit (INDEPENDENT_AMBULATORY_CARE_PROVIDER_SITE_OTHER): Payer: BC Managed Care – PPO | Admitting: Obstetrics & Gynecology

## 2016-10-11 ENCOUNTER — Encounter: Payer: Self-pay | Admitting: Obstetrics & Gynecology

## 2016-10-11 VITALS — BP 130/86 | HR 68 | Resp 12 | Ht 62.0 in | Wt 186.0 lb

## 2016-10-11 DIAGNOSIS — C50411 Malignant neoplasm of upper-outer quadrant of right female breast: Secondary | ICD-10-CM | POA: Diagnosis not present

## 2016-10-11 DIAGNOSIS — C541 Malignant neoplasm of endometrium: Secondary | ICD-10-CM

## 2016-10-11 DIAGNOSIS — Z01419 Encounter for gynecological examination (general) (routine) without abnormal findings: Secondary | ICD-10-CM | POA: Diagnosis not present

## 2016-10-11 DIAGNOSIS — Z170 Estrogen receptor positive status [ER+]: Principal | ICD-10-CM

## 2016-10-11 DIAGNOSIS — Z124 Encounter for screening for malignant neoplasm of cervix: Secondary | ICD-10-CM

## 2016-10-11 DIAGNOSIS — Z0000 Encounter for general adult medical examination without abnormal findings: Secondary | ICD-10-CM

## 2016-10-11 DIAGNOSIS — B372 Candidiasis of skin and nail: Secondary | ICD-10-CM | POA: Diagnosis not present

## 2016-10-11 LAB — POCT URINALYSIS DIPSTICK
BILIRUBIN UA: NEGATIVE
GLUCOSE UA: NEGATIVE
Ketones, UA: NEGATIVE
Leukocytes, UA: NEGATIVE
NITRITE UA: NEGATIVE
PH UA: 5
Protein, UA: NEGATIVE
RBC UA: NEGATIVE
UROBILINOGEN UA: NEGATIVE

## 2016-10-11 MED ORDER — NYSTATIN 100000 UNIT/GM EX CREA: 1.0000 "application " | TOPICAL_CREAM | Freq: Two times a day (BID) | CUTANEOUS | 2 refills | Status: DC

## 2016-10-11 NOTE — Progress Notes (Signed)
61 y.o. G1P1 MarriedCaucasianF here for annual exam.  Diagnosed with ER/PR/Her 2 positive breast cancer in April.  Treatment has been at [facility].  Getting ready to start radiation and will do it locally.  Did genetic testing at [facility] that was negative.    Denies vaginal bleeding.  Is now over five years out from surgery and radiation for endometrial cancer.  Still desires pap smears.  Guidelines reviewed.    Complaint of some issues with erythema and itching under breast and in groin areas, in particular.  Has not been using anything topically.  Does have hx of yeast.  Patient's last menstrual period was 09/27/2010.          Sexually active: Yes.    The current method of family planning is status post hysterectomy.    Exercising: No.  The patient does not participate in regular exercise at present. Smoker:  no  Health Maintenance: Pap:  10/02/15 negative  History of abnormal Pap:  yes MMG:  12/26/15 with breast cancer diagnosis Colonoscopy:  2-3 years ago per patient with Dr. Wallis Mart BMD:   09/17/16 at [facility].  Reviewed in care everywhere TDaP:  UTD with PCP Pneumonia vaccine(s):  never Zostavax:   never Hep C testing: discuss with provider Screening Labs: PCP, Hb today: PCP, Urine today: normal    reports that she has never smoked. She has never used smokeless tobacco. She reports that she does not drink alcohol or use drugs.  Past Medical History:  Diagnosis Date  . Cancer of central portion of right female breast ([place]) 01/08/2016   stage 1A  . Complication of anesthesia    vertigo  . Congenital absence of femur    right  . Endometrioid adenocarcinoma    Stage IA, grade 2  . GERD (gastroesophageal reflux disease)   . PONV (postoperative nausea and vomiting)   . 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 Vertigo    associated with anesthesia  . Vitamin D deficiency 09/2012    Past Surgical History:  Procedure Laterality Date  . ABDOMINAL HYSTERECTOMY   10/08/2010   Robotic, BSO, bilateral pelvic/ R periaortic LND  . CESAREAN SECTION    . CHOLECYSTECTOMY    . CHOLECYSTECTOMY N/A 10/11/2013   Procedure: LAPAROSCOPIC CHOLECYSTECTOMY WITH INTRAOPERATIVE CHOLANGIOGRAM;  Surgeon: Joyice Faster. Cornett, MD;  Location: [place];  Service: General;  Laterality: N/A;  . ERCP N/A 10/12/2013   Procedure: ENDOSCOPIC RETROGRADE CHOLANGIOPANCREATOGRAPHY (ERCP);  Surgeon: Missy Sabins, MD;  Location: [facility] ENDOSCOPY;  Service: Endoscopy;  Laterality: N/A;  . PARTIAL MASTECTOMY WITH AXILLARY SENTINEL LYMPH NODE BIOPSY Right 03/09/16   at [facility]  . REPLACEMENT TOTAL KNEE     Left    Current Outpatient Prescriptions  Medication Sig Dispense Refill  . Acetaminophen 500 MG coapsule Take by mouth.    . beta carotene w/minerals (OCUVITE) tablet Take 1 tablet by mouth daily.    . hydrochlorothiazide (HYDRODIURIL) 25 MG tablet TAKE 1 TABLET (25 MG TOTAL) BY MOUTH ONCE DAILY.  0  . Loratadine (CLARITIN PO) Take 10 mg by mouth.    . meclizine (ANTIVERT) 50 MG tablet Take 1 tablet (50 mg total) by mouth 3 (three) times daily as needed. 30 tablet 2  . omeprazole (PRILOSEC) 10 MG capsule Take 10 mg by mouth daily.    . ondansetron (ZOFRAN) 8 MG tablet Take 1 tablet (8 mg) by mouth every 12 hours PRN nausea    .  Polyethyl Glycol-Propyl Glycol (SYSTANE OP) Apply to eye 3 (three) times daily.     No current facility-administered medications for this visit.     Family History  Problem Relation Age of Onset  . Colon cancer Mother   . Colon cancer Father   . Asthma Paternal Grandmother   . Breast cancer Maternal Grandmother   . Breast cancer Maternal Aunt     ROS:  Pertinent items are noted in HPI.  Otherwise, a comprehensive ROS was negative.  Exam:   BP 130/86 (BP Location: Left Arm, Patient Position: Sitting, Cuff Size: Normal)   Pulse 68   Resp 12   Ht 5\' 2"  (1.575 m)   Wt 186 lb (84.4 kg)   LMP 09/27/2010   BMI 34.02 kg/m     Height: 5\' 2"  (157.5 cm)   Ht Readings from Last 3 Encounters:  10/11/16 5\' 2"  (1.575 m)  01/14/16 5' 4.5" (1.638 m)  12/25/15 5' 4.5" (1.638 m)   General appearance: alert, cooperative and appears stated age Head: Normocephalic, without obvious abnormality, atraumatic Neck: no adenopathy, supple, symmetrical, trachea midline and thyroid normal to inspection and palpation Lungs: clear to auscultation bilaterally Breasts: normal appearance, no masses or tenderness Heart: regular rate and rhythm Abdomen: soft, non-tender; bowel sounds normal; no masses,  no organomegaly Extremities: extremities normal, atraumatic, no cyanosis or edema Skin: erythema with some satellite lesions under breasts, erythematous lesion in left groin that has some purplish apperance Lymph nodes: Cervical, supraclavicular, and axillary nodes normal. No abnormal inguinal nodes palpated Neurologic: Grossly normal  Pelvic: External genitalia:  no lesions              Urethra:  normal appearing urethra with no masses, tenderness or lesions              Bartholins and Skenes: normal                 Vagina: normal appearing vagina with normal color and discharge, no lesions              Cervix: absent              Pap taken: No. Bimanual Exam:  Uterus:  uterus absent              Adnexa: no mass, fullness, tenderness               Rectovaginal: Confirms               Anus:  normal sphincter tone, no lesions  Chaperone was present for exam.  A:  Well Woman with normal exam PMP, no HRT Hypertension Family hx of colon cancer in both parents Family hx of endometrial cancer in one sister (other sister has had limited gyn care) H/O IB endometrial adenocarcinoma 1/12, s/p  TLH, BSO, bilateral LND.  Vaginal cuff radiation.  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 Doing yearly 3D.  She is not sure when she will have her next one  due to upcoming radiation.  Will likely have these at [facility]. Pap smear obtained today.   Nystatin cream BID for up to 14 days in groin region.  Pt KNOWS to call back if this is not significantly improved as it does not appear to be simple yeast (although pt states the area looked like what is under her breast but has gotten worse since she's not had anything  for treatment).  If not significantly improved, recommend skin biopsy.  Pt voices clear understanding. Return annually or prn

## 2016-10-11 NOTE — Progress Notes (Signed)
Please see the Nurse Progress Note in the MD Initial Consult Encounter for this patient. 

## 2016-10-11 NOTE — Progress Notes (Signed)
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 female with a prior history of stage IB grade 2 endometrioid adenocarcinoma in 2012. She received vaginal brachytherapy in our department for management of this issue.  The patient had a screening mammogram on 09/25/2015 that was negative. However in March 2017 the patient self-palpated a lump in her right breast just above the nipple. Right diagnostic mammography with tomosynthesis and right breast ultrasonography at [facility] on 12/26/2015 showed a 1.1 cm oval mass in the right breast central to the nipple. There were no associated calcifications. Breast density was category C. On ultrasound there was a 1.1 cm lobulated solid mass in the right breast as previously described. The right axilla was benign.  Biopsy of the right breast mass in question 12/31/2015 showed grade 2-3 invasive ductal carcinoma, estrogen receptor 100% positive, progesterone receptor 100% positive, both with strong staining intensity, with an MIB-1 of 60%, and HER-2 amplified with a signals ratio of 2.11, the number per cell being 4.43.  The patient presented to Dr. Jana Hakim on 01/14/16 who discussed systemic treatment.  The patient underwent a right lumpectomy and sentinel lymph node biopsy on 03/09/16 at [facility]. This revealed grade 3 invasive ductal carcinoma measuring 1.4 cm with negative margins and DCIS. 2 sentinel and 2 non-sentinel lymph nodes biopsied were negative for malignancy.  The patient started chemotherapy by Dr. Philipp Ovens at [facility] on 04/30/16. She started  adjuvant treatment with Taxol/Herceptin, but within a few minutes of her initial Taxol infusion, she developed SOB, swelling of her tongue, and chest pressure. Treatment was then changed to weekly Abraxane/Herceptin. The patient is currently on maintenance Herceptin every 21 days to complete 1 year of therapy.  The patient and her husband present today to discuss adjuvant radiation therapy to the right breast.  PREVIOUS RADIATION THERAPY: Yes.  11/25/2010 - 12/23/2010: Vaginal brachytherapy under my direction for stage IB grade 2 endometrioid adenocarcinoma.  PAST MEDICAL HISTORY:  has a past medical history of Cancer of central portion of right female breast ([place]) (01/08/2016); Complication of anesthesia; Congenital absence of femur; Endometrioid adenocarcinoma; GERD (gastroesophageal reflux disease); PONV (postoperative nausea and vomiting); Presence of artificial leg; Vertigo; and Vitamin D deficiency (09/2012).    PAST SURGICAL HISTORY: Past Surgical History:  Procedure Laterality Date  . ABDOMINAL HYSTERECTOMY  10/08/2010   Robotic, BSO, bilateral pelvic/ R periaortic LND  . CESAREAN SECTION    . CHOLECYSTECTOMY    . CHOLECYSTECTOMY N/A 10/11/2013   Procedure: LAPAROSCOPIC CHOLECYSTECTOMY WITH INTRAOPERATIVE CHOLANGIOGRAM;  Surgeon: Joyice Faster. Cornett, MD;  Location: [place];  Service: General;  Laterality: N/A;  . ERCP N/A 10/12/2013   Procedure: ENDOSCOPIC RETROGRADE CHOLANGIOPANCREATOGRAPHY (ERCP);  Surgeon: Missy Sabins, MD;  Location: [facility] ENDOSCOPY;  Service: Endoscopy;  Laterality: N/A;  . PARTIAL MASTECTOMY WITH AXILLARY SENTINEL LYMPH NODE BIOPSY Right 03/09/16   at [facility]  . REPLACEMENT TOTAL KNEE     Left    FAMILY HISTORY: family history includes Asthma in her paternal grandmother; Breast cancer in her maternal aunt and maternal grandmother; Colon cancer in her father and mother.  SOCIAL HISTORY:  reports  that she has never smoked. She has never used smokeless tobacco. She  reports that she does not drink alcohol or use drugs.  ALLERGIES: Codeine; Hydromorphone; Morphine; and Penicillins  MEDICATIONS:  Current Outpatient Prescriptions  Medication Sig Dispense Refill  . Acetaminophen 500 MG coapsule Take by mouth.    . beta carotene w/minerals (OCUVITE) tablet Take 1 tablet by mouth daily.    . hydrochlorothiazide (HYDRODIURIL) 25 MG tablet TAKE 1 TABLET (25 MG TOTAL) BY MOUTH ONCE DAILY.  0  . Loratadine (CLARITIN PO) Take 10 mg by mouth.    . meclizine (ANTIVERT) 50 MG tablet Take 1 tablet (50 mg total) by mouth 3 (three) times daily as needed. 30 tablet 2  . nystatin cream (MYCOSTATIN) Apply 1 application topically 2 (two) times daily. 30 g 2  . omeprazole (PRILOSEC) 10 MG capsule Take 10 mg by mouth daily.    . ondansetron (ZOFRAN) 8 MG tablet Take 1 tablet (8 mg) by mouth every 12 hours PRN nausea    . Polyethyl Glycol-Propyl Glycol (SYSTANE OP) Apply to eye 3 (three) times daily.     No current facility-administered medications for this encounter.     REVIEW OF SYSTEMS:  A 15 point review of systems is documented in the electronic medical record. This was obtained by the nursing staff. However, I reviewed this with the patient to discuss relevant findings and make appropriate changes.  Pertinent items noted in HPI and remainder of comprehensive ROS otherwise negative.   The patient denies pain or lymphedema.   PHYSICAL EXAM:  height is 5' 2"  (1.575 m) and weight is 188 lb 3.2 oz (85.4 kg). Her oral temperature is 97.7 F (36.5 C). Her blood pressure is 139/77 and her pulse is 66. Her oxygen saturation is 100%.   General: Alert and oriented, in no acute distress HEENT: Head is normocephalic. Extraocular movements are intact. Oropharynx is clear. Neck: Neck is supple, no palpable cervical or supraclavicular lymphadenopathy. Heart: Regular in rate and rhythm with no murmurs, rubs, or gallops. Chest: Clear to auscultation bilaterally, with no rhonchi,  wheezes, or rales. Abdomen: Soft, nontender, nondistended, with no rigidity or guarding. Extremities: No cyanosis or edema. Right lower leg amputation, above the knee Lymphatics: see Neck Exam Skin: No concerning lesions. Musculoskeletal: symmetric strength and muscle tone throughout. Neurologic: Cranial nerves II through XII are grossly intact. No obvious focalities. Speech is fluent. Coordination is intact. Psychiatric: Judgment and insight are intact. Affect is appropriate. Breast: Left breast no palpable mass or nipple discharge. Right breast no palpable mass or nipple discharge, well healed scar in the periareolar region towards the upper inner aspect of the breast. Separate well-healed scar in the axillary region from her sentinel node procedure.  ECOG = 1  LABORATORY DATA:  Lab Results  Component Value Date   WBC 5.7 01/14/2016   HGB 13.4 01/14/2016   HCT 41.3 01/14/2016   MCV 89.6 01/14/2016   PLT 276 01/14/2016   NEUTROABS 3.4 01/14/2016   Lab Results  Component Value Date   NA 144 01/14/2016   K 4.5 01/14/2016   CL 101 10/12/2013   CO2 25 01/14/2016   GLUCOSE 78 01/14/2016   CREATININE 0.9 01/14/2016   CALCIUM 9.2 01/14/2016      RADIOGRAPHY: No results found.    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 The patient is post-op and post adjuvant chemotherapy. She is currently undergoing Herceptin maintenance every 21 days to complete 1 year of treatment.  I spoke to the patient today regarding her diagnosis and options for treatment. We discussed the equivalence in terms of survival and local failure between mastectomy and breast conservation. We discussed the role of radiation in decreasing local failures in patients who undergo lumpectomy. We discussed the process of simulation and the  placement tattoos. We discussed 6 1/2 weeks of treatment as an outpatient. We discussed the possibility of asymptomatic lung damage. We discussed the low likelihood of secondary malignancies. We discussed the possible side effects including but not limited to skin redness, fatigue, permanent skin darkening, and breast swelling.   PLAN: The patient signed a consent form and a copy was placed in her medical chart. CT simulation is scheduled on 10/13/16 at [facility].   ------------------------------------------------  Blair Promise, PhD, MD  This document serves as a record of services personally performed by Gery Pray, MD. It was created on his behalf by Darcus Austin, a trained medical scribe. The creation of this record is based on the scribe's personal observations and the provider's statements to them. This document has been checked and approved by the attending provider.

## 2016-10-13 ENCOUNTER — Ambulatory Visit
Admission: RE | Admit: 2016-10-13 | Payer: BC Managed Care – PPO | Source: Ambulatory Visit | Admitting: Radiation Oncology

## 2016-10-13 ENCOUNTER — Ambulatory Visit: Payer: BC Managed Care – PPO | Admitting: Radiation Oncology

## 2016-10-13 ENCOUNTER — Ambulatory Visit: Payer: BC Managed Care – PPO

## 2016-10-13 LAB — IPS PAP TEST WITH REFLEX TO HPV

## 2016-10-19 ENCOUNTER — Ambulatory Visit
Admission: RE | Admit: 2016-10-19 | Discharge: 2016-10-19 | Disposition: A | Discharge status: Home / Self Care | Payer: BC Managed Care – PPO | Source: Ambulatory Visit | Attending: Radiation Oncology | Admitting: Radiation Oncology

## 2016-10-19 DIAGNOSIS — C541 Malignant neoplasm of endometrium: Secondary | ICD-10-CM

## 2016-10-19 DIAGNOSIS — C50411 Malignant neoplasm of upper-outer quadrant of right female breast: Secondary | ICD-10-CM | POA: Diagnosis not present

## 2016-10-19 DIAGNOSIS — Z170 Estrogen receptor positive status [ER+]: Secondary | ICD-10-CM

## 2016-10-20 ENCOUNTER — Ambulatory Visit: Payer: BC Managed Care – PPO | Admitting: Radiation Oncology

## 2016-10-20 NOTE — Progress Notes (Signed)
  Radiation Oncology         (336) [phone] ________________________________  Name: Raven Marks MRN: [id number]  Date: 10/19/2016  DOB: 1955/11/10  SIMULATION AND TREATMENT PLANNING NOTE    ICD-9-CM ICD-10-CM   1. Endometrial adenocarcinoma (HCC) 182.0 C54.1   2. 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  Identity was confirmed.  All relevant records and images related to the planned course of therapy were reviewed.  The patient freely provided informed written consent to proceed with treatment after reviewing the details related to the planned course of therapy. The consent form was witnessed and verified by the simulation staff.  Then, the patient was set-up in a stable reproducible  supine position for radiation therapy.  CT images were obtained.  Surface markings were placed.  The CT images were loaded into the planning software.  Then the target and avoidance structures were contoured.  Treatment planning then occurred.  The radiation prescription was entered and confirmed.  Then, I designed and supervised the construction of a total of 3 medically necessary complex treatment devices.  I have requested : 3D Simulation  I have requested a DVH of the following structures: Heart lungs lumpectomy cavity.  I have ordered:CBC  PLAN:  The patient will receive 50.4 Gy in 28 fractions followed by a boost to the lumpectomy cavity of 10 gray for a cumulative dose of 60.4 gray.  -----------------------------------  Blair Promise, PhD, MD

## 2016-10-25 DIAGNOSIS — C50411 Malignant neoplasm of upper-outer quadrant of right female breast: Secondary | ICD-10-CM | POA: Diagnosis not present

## 2016-10-26 ENCOUNTER — Ambulatory Visit
Admission: RE | Admit: 2016-10-26 | Discharge: 2016-10-26 | Disposition: A | Discharge status: Home / Self Care | Payer: BC Managed Care – PPO | Source: Ambulatory Visit | Attending: Radiation Oncology | Admitting: Radiation Oncology

## 2016-10-26 ENCOUNTER — Ambulatory Visit: Payer: BC Managed Care – PPO | Admitting: Radiation Oncology

## 2016-10-26 DIAGNOSIS — C50411 Malignant neoplasm of upper-outer quadrant of right female breast: Secondary | ICD-10-CM

## 2016-10-26 DIAGNOSIS — Z170 Estrogen receptor positive status [ER+]: Principal | ICD-10-CM

## 2016-10-26 NOTE — Progress Notes (Signed)
  Radiation Oncology         [phone]) [phone] ________________________________  Name: Raven Marks MRN: [id number]  Date: 10/26/2016  DOB: 12/28/1955  Simulation Verification Note    ICD-9-CM ICD-10-CM   1. Malignant neoplasm of upper-outer quadrant of right breast in female, estrogen receptor positive ([place]) 174.4 C50.411    V86.0 Z17.0     Status: outpatient  NARRATIVE: The patient was brought to the treatment unit and placed in the planned treatment position. The clinical setup was verified. Then port films were obtained and uploaded to the radiation oncology medical record software.  The treatment beams were carefully compared against the planned radiation fields. The position location and shape of the radiation fields was reviewed. They targeted volume of tissue appears to be appropriately covered by the radiation beams. Organs at risk appear to be excluded as planned.  Based on my personal review, I approved the simulation verification. The patient's treatment will proceed as planned.  -----------------------------------  Blair Promise, PhD, MD

## 2016-10-27 ENCOUNTER — Ambulatory Visit
Admission: RE | Admit: 2016-10-27 | Discharge: 2016-10-27 | Disposition: A | Discharge status: Home / Self Care | Payer: BC Managed Care – PPO | Source: Ambulatory Visit | Attending: Radiation Oncology | Admitting: Radiation Oncology

## 2016-10-27 DIAGNOSIS — C50411 Malignant neoplasm of upper-outer quadrant of right female breast: Secondary | ICD-10-CM | POA: Diagnosis not present

## 2016-10-28 ENCOUNTER — Ambulatory Visit
Admission: RE | Admit: 2016-10-28 | Discharge: 2016-10-28 | Disposition: A | Discharge status: Home / Self Care | Payer: BC Managed Care – PPO | Source: Ambulatory Visit | Attending: Radiation Oncology | Admitting: Radiation Oncology

## 2016-10-28 DIAGNOSIS — C50411 Malignant neoplasm of upper-outer quadrant of right female breast: Secondary | ICD-10-CM | POA: Diagnosis not present

## 2016-10-29 ENCOUNTER — Ambulatory Visit: Payer: BC Managed Care – PPO

## 2016-11-01 ENCOUNTER — Ambulatory Visit
Admission: RE | Admit: 2016-11-01 | Discharge: 2016-11-01 | Disposition: A | Discharge status: Home / Self Care | Payer: BC Managed Care – PPO | Source: Ambulatory Visit | Attending: Radiation Oncology | Admitting: Radiation Oncology

## 2016-11-01 DIAGNOSIS — C50411 Malignant neoplasm of upper-outer quadrant of right female breast: Secondary | ICD-10-CM | POA: Diagnosis not present

## 2016-11-01 DIAGNOSIS — Z170 Estrogen receptor positive status [ER+]: Principal | ICD-10-CM

## 2016-11-01 MED ORDER — ALRA NON-METALLIC DEODORANT (RAD-ONC)
1.0000 "application " | Freq: Once | TOPICAL | Status: AC
  Administered 2016-11-01: 1 via TOPICAL

## 2016-11-01 MED ORDER — RADIAPLEXRX EX GEL
Freq: Once | CUTANEOUS | Status: AC
  Administered 2016-11-01: 16:00:00 via TOPICAL

## 2016-11-01 NOTE — Progress Notes (Signed)
Pt here for patient teaching.  Pt given Radiation and You booklet, skin care instructions, Alra deodorant and Radiaplex gel. Reviewed areas of pertinence such as fatigue, skin changes, breast tenderness, breast swelling, earaches and taste changes . Pt able to give teach back of to pat skin and use unscented/gentle soap,apply Radiaplex bid, avoid applying anything to skin within 4 hours of treatment and to use an electric razor if they must shave. Pt demonstrated understanding and verbalizes understanding of information given and will contact nursing with any questions or concerns.

## 2016-11-02 ENCOUNTER — Ambulatory Visit
Admission: RE | Admit: 2016-11-02 | Discharge: 2016-11-02 | Disposition: A | Discharge status: Home / Self Care | Payer: BC Managed Care – PPO | Source: Ambulatory Visit | Attending: Radiation Oncology | Admitting: Radiation Oncology

## 2016-11-02 ENCOUNTER — Ambulatory Visit: Payer: BC Managed Care – PPO | Admitting: Radiation Oncology

## 2016-11-02 ENCOUNTER — Encounter: Payer: Self-pay | Admitting: Radiation Oncology

## 2016-11-02 VITALS — BP 122/76 | HR 79 | Temp 97.8°F | Ht 62.0 in | Wt 187.2 lb

## 2016-11-02 DIAGNOSIS — Z170 Estrogen receptor positive status [ER+]: Principal | ICD-10-CM

## 2016-11-02 DIAGNOSIS — C50411 Malignant neoplasm of upper-outer quadrant of right female breast: Secondary | ICD-10-CM | POA: Diagnosis not present

## 2016-11-02 NOTE — Progress Notes (Signed)
  Radiation Oncology         (336) [phone] ________________________________  Name: Raven Marks MRN: [id number]  Date: 11/02/2016  DOB: 1955-12-14  Weekly Radiation Therapy Management    ICD-9-CM ICD-10-CM   1. Malignant neoplasm of upper-outer quadrant of right breast in female, estrogen receptor positive (HCC) 174.4 C50.411    V86.0 Z17.0      Current Dose: 7.2 Gy     Planned Dose:  60.4 Gy  Narrative . . . . . . . . The patient presents for routine under treatment assessment.                                    Raven Marks has completed 4 fractions to her right breast. She denies having pain or fatigue. She has noticed feeling short of breath after treatments and is wondering if it is from rasing her arm during treatment. She is using radiaplex and denies having any skin irritation. She denies chills or fever.                                  Set-up films were reviewed.                                 The chart was checked. Physical Findings. . .  height is 5\' 2"  (1.575 m) and weight is 187 lb 3.2 oz (84.9 kg). Her oral temperature is 97.8 F (36.6 C). Her blood pressure is 122/76 and her pulse is 79. Her oxygen saturation is 97%. . Weight essentially stable.  Lungs are clear to auscultation bilaterally. Heart has regular rate and rhythm. No skin reaction at this time. Impression . . . . . . . The patient is tolerating radiation. Plan . . . . . . . . . . . . Continue treatment as planned.  ________________________________   Blair Promise, PhD, MD  This document serves as a record of services personally performed by Gery Pray, MD. It was created on his behalf by Darcus Austin, a trained medical scribe. The creation of this record is based on the scribe's personal observations and the provider's statements to them. This document has been checked and approved by the attending provider.

## 2016-11-02 NOTE — Progress Notes (Signed)
Raven Marks has completed 4 fractions to her right breast.  She denies having pain or fatigue.  She has noticed feeling short of breath after treatments.  She is wondering if it is from rasing her arm during treatment.  She is using radiaplex and denies having any skin irritation.  BP 122/76 (BP Location: Left Arm, Patient Position: Sitting)   Pulse 79   Temp 97.8 F (36.6 C) (Oral)   Ht 5\' 2"  (1.575 m)   Wt 187 lb 3.2 oz (84.9 kg)   LMP 09/27/2010   SpO2 97%   BMI 34.24 kg/m    Wt Readings from Last 3 Encounters:  11/02/16 187 lb 3.2 oz (84.9 kg)  10/11/16 188 lb 3.2 oz (85.4 kg)  10/11/16 186 lb (84.4 kg)

## 2016-11-03 ENCOUNTER — Ambulatory Visit: Payer: BC Managed Care – PPO

## 2016-11-03 ENCOUNTER — Ambulatory Visit
Admission: RE | Admit: 2016-11-03 | Discharge: 2016-11-03 | Disposition: A | Discharge status: Home / Self Care | Payer: BC Managed Care – PPO | Source: Ambulatory Visit | Attending: Radiation Oncology | Admitting: Radiation Oncology

## 2016-11-03 DIAGNOSIS — C50411 Malignant neoplasm of upper-outer quadrant of right female breast: Secondary | ICD-10-CM | POA: Diagnosis not present

## 2016-11-04 ENCOUNTER — Ambulatory Visit: Payer: BC Managed Care – PPO

## 2016-11-04 ENCOUNTER — Ambulatory Visit
Admission: RE | Admit: 2016-11-04 | Discharge: 2016-11-04 | Disposition: A | Discharge status: Home / Self Care | Payer: BC Managed Care – PPO | Source: Ambulatory Visit | Attending: Radiation Oncology | Admitting: Radiation Oncology

## 2016-11-04 DIAGNOSIS — C50411 Malignant neoplasm of upper-outer quadrant of right female breast: Secondary | ICD-10-CM | POA: Diagnosis not present

## 2016-11-05 ENCOUNTER — Ambulatory Visit: Payer: BC Managed Care – PPO

## 2016-11-05 ENCOUNTER — Ambulatory Visit
Admission: RE | Admit: 2016-11-05 | Discharge: 2016-11-05 | Disposition: A | Discharge status: Home / Self Care | Payer: BC Managed Care – PPO | Source: Ambulatory Visit | Attending: Radiation Oncology | Admitting: Radiation Oncology

## 2016-11-05 DIAGNOSIS — C50411 Malignant neoplasm of upper-outer quadrant of right female breast: Secondary | ICD-10-CM | POA: Diagnosis not present

## 2016-11-08 ENCOUNTER — Telehealth: Payer: Self-pay | Admitting: *Deleted

## 2016-11-08 ENCOUNTER — Ambulatory Visit: Payer: BC Managed Care – PPO

## 2016-11-08 NOTE — Telephone Encounter (Signed)
Message left to return call to Raven Marks at [phone].    

## 2016-11-08 NOTE — Telephone Encounter (Signed)
Patient returned call. RN advised patient she was calling for an update on patient's condition after using the nystatin. Patient states that both of the areas under her breast and on her vulva have "cleared up." No further complaints of itching. Patient states she has started her radiation and is doing well. RN advised this message would be sent to Dr. Sabra Heck for review.   Routing to provider for review.

## 2016-11-08 NOTE — Telephone Encounter (Signed)
-----   Message from Megan Salon, MD sent at 11/07/2016  6:21 PM EST ----- Regarding: vulvar and breast skin changes Can you please call this pt?  She's undergoing treatment for her breast cancer but she had some skin changes under her breasts and on the vulva.  I treated it with nystatin.  She was to call back and has not.  Can you check on her?  May need a recheck and possible vulvar biopsy.  Thanks.  MSM

## 2016-11-09 ENCOUNTER — Ambulatory Visit: Payer: BC Managed Care – PPO

## 2016-11-09 ENCOUNTER — Telehealth: Payer: Self-pay | Admitting: Oncology

## 2016-11-09 ENCOUNTER — Ambulatory Visit: Payer: BC Managed Care – PPO | Admitting: Radiation Oncology

## 2016-11-09 NOTE — Telephone Encounter (Signed)
Raven Marks called and said she has been diagnosed with the flu.  She will start taking Tamiflu today.  She said she is running a temperature of 99.9 degrees.  Advised her she will need to be afebrile for 48 hours before she can come in for treatment.  We will cancel today and tomorrow and she will call tomorrow to see how she is feeling.  Notified [place] [facility] via email of cancellations.

## 2016-11-10 ENCOUNTER — Telehealth: Payer: Self-pay | Admitting: Oncology

## 2016-11-10 ENCOUNTER — Ambulatory Visit: Payer: BC Managed Care – PPO

## 2016-11-10 NOTE — Telephone Encounter (Signed)
Raven Marks called and said she is filling better today.  She has not had a fever today.  She is going to call tomorrow to let us know if she feels well enough for treatment.

## 2016-11-11 ENCOUNTER — Ambulatory Visit: Payer: BC Managed Care – PPO

## 2016-11-12 ENCOUNTER — Ambulatory Visit
Admission: RE | Admit: 2016-11-12 | Discharge: 2016-11-12 | Disposition: A | Discharge status: Home / Self Care | Payer: BC Managed Care – PPO | Source: Ambulatory Visit | Attending: Radiation Oncology | Admitting: Radiation Oncology

## 2016-11-12 DIAGNOSIS — C50411 Malignant neoplasm of upper-outer quadrant of right female breast: Secondary | ICD-10-CM | POA: Diagnosis not present

## 2016-11-15 ENCOUNTER — Ambulatory Visit
Admission: RE | Admit: 2016-11-15 | Discharge: 2016-11-15 | Disposition: A | Discharge status: Home / Self Care | Payer: BC Managed Care – PPO | Source: Ambulatory Visit | Attending: Radiation Oncology | Admitting: Radiation Oncology

## 2016-11-15 DIAGNOSIS — C50411 Malignant neoplasm of upper-outer quadrant of right female breast: Secondary | ICD-10-CM | POA: Diagnosis not present

## 2016-11-16 ENCOUNTER — Encounter: Payer: Self-pay | Admitting: Radiation Oncology

## 2016-11-16 ENCOUNTER — Ambulatory Visit
Admission: RE | Admit: 2016-11-16 | Discharge: 2016-11-16 | Disposition: A | Discharge status: Home / Self Care | Payer: BC Managed Care – PPO | Source: Ambulatory Visit | Attending: Radiation Oncology | Admitting: Radiation Oncology

## 2016-11-16 VITALS — BP 133/82 | HR 84 | Temp 98.2°F | Resp 18 | Ht 62.0 in | Wt 185.0 lb

## 2016-11-16 DIAGNOSIS — C50411 Malignant neoplasm of upper-outer quadrant of right female breast: Secondary | ICD-10-CM | POA: Diagnosis not present

## 2016-11-16 DIAGNOSIS — Z170 Estrogen receptor positive status [ER+]: Principal | ICD-10-CM

## 2016-11-16 NOTE — Progress Notes (Signed)
  Radiation Oncology         (336) [phone] ________________________________  Name: Raven Marks MRN: [id number]  Date: 11/16/2016  DOB: Feb 05, 1956  Weekly Radiation Therapy Management    ICD-9-CM ICD-10-CM   1. Malignant neoplasm of upper-outer quadrant of right breast in female, estrogen receptor positive (HCC) 174.4 C50.411    V86.0 Z17.0      Current Dose: 18 Gy     Planned Dose:  60.4 Gy  Narrative . . . . . . . . The patient presents for routine under treatment assessment.                                    Adrean Comegys has completed 10 fractions to her right breast. She denies having pain or fatigue. She has not noticed feeling short of breath after treatments this past week. She is using radiaplex and denies having any skin irritation. The nurse notes mild tanning to the right axilla and underneath the right breast. The patient reports having the flu last week and feels better now. Her husband had the flu as well. She was on Tamiflu and had a fever up to 102 degrees F. She has a good appetite.                                  Set-up films were reviewed.                                 The chart was checked. Physical Findings. . .  height is 5\' 2"  (1.575 m) and weight is 185 lb (83.9 kg). Her oral temperature is 98.2 F (36.8 C). Her blood pressure is 133/82 and her pulse is 84. Her respiration is 18 and oxygen saturation is 95%. . Weight essentially stable.  Lungs are clear to auscultation bilaterally. Heart has regular rate and rhythm. Hyperpigmentation and erythema changes of the right breast, more so in the inframammary fold. Impression . . . . . . . The patient is tolerating radiation. Plan . . . . . . . . . . . . Continue treatment as planned.  ________________________________   Blair Promise, PhD, MD  This document serves as a record of services personally performed by Gery Pray, MD. It was created on his behalf by Darcus Austin, a trained medical scribe. The  creation of this record is based on the scribe's personal observations and the provider's statements to them. This document has been checked and approved by the attending provider.

## 2016-11-16 NOTE — Progress Notes (Signed)
Raven Marks has completed 10 fractions to her right breast.  She denies having pain or fatigue.  She has not noticed feeling short of breath after treatments this pasted week.    She is using radiaplex and denies having any skin irritation has mild tanning to axilla and under the right breast.  Had the flu last week better now.  Appetite is good. Wt Readings from Last 3 Encounters:  11/16/16 185 lb (83.9 kg)  11/02/16 187 lb 3.2 oz (84.9 kg)  10/11/16 188 lb 3.2 oz (85.4 kg)  BP 133/82   Pulse 84   Temp 98.2 F (36.8 C) (Oral)   Resp 18   Ht 5\' 2"  (1.575 m)   Wt 185 lb (83.9 kg)   LMP 09/27/2010   SpO2 95%   BMI 33.84 kg/m

## 2016-11-17 ENCOUNTER — Ambulatory Visit
Admission: RE | Admit: 2016-11-17 | Discharge: 2016-11-17 | Disposition: A | Discharge status: Home / Self Care | Payer: BC Managed Care – PPO | Source: Ambulatory Visit | Attending: Radiation Oncology | Admitting: Radiation Oncology

## 2016-11-17 DIAGNOSIS — C50411 Malignant neoplasm of upper-outer quadrant of right female breast: Secondary | ICD-10-CM | POA: Diagnosis not present

## 2016-11-18 ENCOUNTER — Ambulatory Visit
Admission: RE | Admit: 2016-11-18 | Discharge: 2016-11-18 | Disposition: A | Discharge status: Home / Self Care | Payer: BC Managed Care – PPO | Source: Ambulatory Visit | Attending: Radiation Oncology | Admitting: Radiation Oncology

## 2016-11-18 DIAGNOSIS — C50411 Malignant neoplasm of upper-outer quadrant of right female breast: Secondary | ICD-10-CM | POA: Diagnosis not present

## 2016-11-19 ENCOUNTER — Ambulatory Visit: Payer: BC Managed Care – PPO

## 2016-11-22 ENCOUNTER — Ambulatory Visit
Admission: RE | Admit: 2016-11-22 | Discharge: 2016-11-22 | Disposition: A | Discharge status: Home / Self Care | Payer: BC Managed Care – PPO | Source: Ambulatory Visit | Attending: Radiation Oncology | Admitting: Radiation Oncology

## 2016-11-22 DIAGNOSIS — C50411 Malignant neoplasm of upper-outer quadrant of right female breast: Secondary | ICD-10-CM | POA: Diagnosis not present

## 2016-11-23 ENCOUNTER — Ambulatory Visit
Admission: RE | Admit: 2016-11-23 | Discharge: 2016-11-23 | Disposition: A | Discharge status: Home / Self Care | Payer: BC Managed Care – PPO | Source: Ambulatory Visit | Attending: Radiation Oncology | Admitting: Radiation Oncology

## 2016-11-23 ENCOUNTER — Ambulatory Visit: Payer: BC Managed Care – PPO | Admitting: Radiation Oncology

## 2016-11-23 ENCOUNTER — Encounter: Payer: Self-pay | Admitting: Radiation Oncology

## 2016-11-23 VITALS — BP 126/87 | HR 82 | Temp 98.0°F | Ht 62.0 in | Wt 186.6 lb

## 2016-11-23 DIAGNOSIS — C50411 Malignant neoplasm of upper-outer quadrant of right female breast: Secondary | ICD-10-CM | POA: Diagnosis not present

## 2016-11-23 DIAGNOSIS — Z170 Estrogen receptor positive status [ER+]: Principal | ICD-10-CM

## 2016-11-23 NOTE — Progress Notes (Signed)
Raven Marks presents for her 14th fraction of radiation to her Right Breast. She denies pain. She has some mild fatigue especially at the end of the day. Her Right Breast is slightly red with some hyperpigmentation. She is using the Radiaplex twice daily.   BP 126/87   Pulse 82   Temp 98 F (36.7 C)   Ht 5\' 2"  (1.575 m)   Wt 186 lb 9.6 oz (84.6 kg)   LMP 09/27/2010   SpO2 97% Comment: room air  BMI 34.13 kg/m    Wt Readings from Last 3 Encounters:  11/23/16 186 lb 9.6 oz (84.6 kg)  11/16/16 185 lb (83.9 kg)  11/02/16 187 lb 3.2 oz (84.9 kg)

## 2016-11-23 NOTE — Progress Notes (Signed)
  Radiation Oncology         (336) [phone] ________________________________  Name: Raven Marks MRN: [id number]  Date: 11/23/2016  DOB: 1956-06-28  Weekly Radiation Therapy Management    ICD-9-CM ICD-10-CM   1. Malignant neoplasm of upper-outer quadrant of right breast in female, estrogen receptor positive (HCC) 174.4 C50.411    V86.0 Z17.0      Current Dose: 25.2 Gy     Planned Dose:  60.4 Gy  Narrative . . . . . . . . The patient presents for routine under treatment assessment.                                    The patient completed her 14th fraction to her right breast. She denies pain, but had some pruritus in the treatment area for which she applied hydrocortisone and it stopped. The nurse notes the right breast is slightly red with hyperpigmentation and she uses radiaplex BID. The patient has fatigue, especially at the end of the day.                                  Set-up films were reviewed.                                 The chart was checked. Physical Findings. . .  height is 5\' 2"  (1.575 m) and weight is 186 lb 9.6 oz (84.6 kg). Her temperature is 98 F (36.7 C). Her blood pressure is 126/87 and her pulse is 82. Her oxygen saturation is 97%. . Weight essentially stable.  Lungs are clear to auscultation bilaterally. Heart has regular rate and rhythm. Erythema and hyperpigmenation changes of the right breast, more so in the inframammary fold, no skin breakdown. Impression . . . . . . . The patient is tolerating radiation. Plan . . . . . . . . . . . . Continue treatment as planned. ________________________________   Blair Promise, PhD, MD  This document serves as a record of services personally performed by Gery Pray, MD. It was created on his behalf by Darcus Austin, a trained medical scribe. The creation of this record is based on the scribe's personal observations and the provider's statements to them. This document has been checked and approved by the attending  provider.

## 2016-11-24 ENCOUNTER — Encounter: Payer: BC Managed Care – PPO | Admitting: Radiation Oncology

## 2016-11-24 ENCOUNTER — Ambulatory Visit: Admission: RE | Admit: 2016-11-24 | Payer: BC Managed Care – PPO | Source: Ambulatory Visit

## 2016-11-25 ENCOUNTER — Ambulatory Visit: Payer: BC Managed Care – PPO

## 2016-11-25 DIAGNOSIS — C50411 Malignant neoplasm of upper-outer quadrant of right female breast: Secondary | ICD-10-CM | POA: Diagnosis not present

## 2016-11-26 ENCOUNTER — Ambulatory Visit: Payer: BC Managed Care – PPO

## 2016-11-29 ENCOUNTER — Ambulatory Visit: Payer: BC Managed Care – PPO

## 2016-11-29 ENCOUNTER — Inpatient Hospital Stay
Admission: RE | Admit: 2016-11-29 | Discharge: 2016-11-29 | Disposition: A | Discharge status: Home / Self Care | Payer: Self-pay | Source: Ambulatory Visit | Attending: Radiation Oncology | Admitting: Radiation Oncology

## 2016-11-29 ENCOUNTER — Encounter: Payer: Self-pay | Admitting: Radiation Oncology

## 2016-11-29 ENCOUNTER — Ambulatory Visit
Admission: RE | Admit: 2016-11-29 | Discharge: 2016-11-29 | Disposition: A | Discharge status: Home / Self Care | Payer: BC Managed Care – PPO | Source: Ambulatory Visit | Attending: Radiation Oncology | Admitting: Radiation Oncology

## 2016-11-29 VITALS — BP 132/74 | HR 72 | Temp 97.7°F

## 2016-11-29 DIAGNOSIS — C50411 Malignant neoplasm of upper-outer quadrant of right female breast: Secondary | ICD-10-CM

## 2016-11-29 DIAGNOSIS — Z170 Estrogen receptor positive status [ER+]: Principal | ICD-10-CM

## 2016-11-29 MED ORDER — BIAFINE EX EMUL
Freq: Once | CUTANEOUS | Status: AC
  Administered 2016-11-29: 17:00:00 via TOPICAL

## 2016-11-29 NOTE — Progress Notes (Signed)
Raven Marks has completed 15 fractions to her right breast.  She noticed a red, itchy rash on her neck a week ago after her last radiation treatment.  She has tried Aveeno cream on the area without relief.  She denies changed detergents or starting any new medications.  The skin on her right breast has dermatitis on the upper inner portion.  She also has an area of dry peeling under her breast.  She is using radiaplex and has been given biafine to try.  BP 132/74 (BP Location: Left Arm, Patient Position: Sitting)   Pulse 72   Temp 97.7 F (36.5 C) (Oral)   LMP 09/27/2010   SpO2 98%    Wt Readings from Last 3 Encounters:  11/23/16 186 lb 9.6 oz (84.6 kg)  11/16/16 185 lb (83.9 kg)  11/02/16 187 lb 3.2 oz (84.9 kg)

## 2016-11-29 NOTE — Progress Notes (Signed)
  Radiation Oncology         (336) [phone] ________________________________  Name: Raven Marks MRN: [id number]  Date: 11/29/2016  DOB: 04/21/1956  Weekly Radiation Therapy Management    ICD-9-CM ICD-10-CM   1. Malignant neoplasm of upper-outer quadrant of right breast in female, estrogen receptor positive (HCC) 174.4 C50.411 topical emolient (BIAFINE) emulsion   V86.0 Z17.0      Current Dose: 27 Gy     Planned Dose:  60.4 Gy  Narrative . . . . . . . . The patient Requested to be seen today. She has developed a rash along the lower part of her neck and midline upper anterior chest. This developed over the weekend. This causes the patient to itch in this area Patient denies any swallowing difficulties or breathing problems. She denies any using any new soaps or new clothing.                                   The patient is without complaint.                                 Set-up films were reviewed.                                 The chart was checked. Physical Findings. . .  oral temperature is 97.7 F (36.5 C). Her blood pressure is 132/74 and her pulse is 72. Her oxygen saturation is 98%. . The lungs are clear. The heart has regular rhythm and rate. The right breast area shows erythema and hyperpigmentation changes with no skin breakdown. The upper central anterior chest and lower neck patient has a macular rash. No signs of infection Impression . . . . . . . The patient is tolerating radiation. Rash of unknown etiology. I recommend the patient apply hydrocortisone cream to this area and to use oral Benadryl at night to aid in sleeping and antihistamine effect Plan . . . . . . . . . . . . Continue treatment as planned.  ________________________________   Blair Promise, PhD, MD

## 2016-11-30 ENCOUNTER — Ambulatory Visit: Payer: BC Managed Care – PPO

## 2016-11-30 ENCOUNTER — Ambulatory Visit: Payer: BC Managed Care – PPO | Admitting: Radiation Oncology

## 2016-11-30 ENCOUNTER — Ambulatory Visit
Admission: RE | Admit: 2016-11-30 | Discharge: 2016-11-30 | Disposition: A | Discharge status: Home / Self Care | Payer: BC Managed Care – PPO | Source: Ambulatory Visit | Attending: Radiation Oncology | Admitting: Radiation Oncology

## 2016-11-30 DIAGNOSIS — C50411 Malignant neoplasm of upper-outer quadrant of right female breast: Secondary | ICD-10-CM | POA: Diagnosis not present

## 2016-12-01 ENCOUNTER — Ambulatory Visit
Admission: RE | Admit: 2016-12-01 | Discharge: 2016-12-01 | Disposition: A | Discharge status: Home / Self Care | Payer: BC Managed Care – PPO | Source: Ambulatory Visit | Attending: Radiation Oncology | Admitting: Radiation Oncology

## 2016-12-01 ENCOUNTER — Ambulatory Visit: Payer: BC Managed Care – PPO

## 2016-12-01 DIAGNOSIS — C50411 Malignant neoplasm of upper-outer quadrant of right female breast: Secondary | ICD-10-CM | POA: Diagnosis not present

## 2016-12-02 ENCOUNTER — Ambulatory Visit
Admission: RE | Admit: 2016-12-02 | Discharge: 2016-12-02 | Disposition: A | Discharge status: Home / Self Care | Payer: BC Managed Care – PPO | Source: Ambulatory Visit | Attending: Radiation Oncology | Admitting: Radiation Oncology

## 2016-12-02 ENCOUNTER — Ambulatory Visit: Payer: BC Managed Care – PPO

## 2016-12-02 ENCOUNTER — Encounter: Payer: Self-pay | Admitting: Radiation Oncology

## 2016-12-02 VITALS — BP 132/82 | HR 72 | Temp 98.2°F | Resp 18 | Wt 188.2 lb

## 2016-12-02 DIAGNOSIS — C50411 Malignant neoplasm of upper-outer quadrant of right female breast: Secondary | ICD-10-CM

## 2016-12-02 DIAGNOSIS — Z170 Estrogen receptor positive status [ER+]: Principal | ICD-10-CM

## 2016-12-02 NOTE — Progress Notes (Signed)
Raven Marks completed 18th fraction to right breast.  Denies having any pain.  Reports being fatigued at the end of the day.  She still works daily.  Reports itching to neck and chest have improved with hydrocortisone but still slightly present with a small rash to chest and left arm.  Hyperpigmentation to right breast with some pinkness to right breast.  Vitals:   12/02/16 1621  BP: 132/82  Pulse: 72  Resp: 18  Temp: 98.2 F (36.8 C)  TempSrc: Oral  SpO2: 98%  Weight: 188 lb 3.2 oz (85.4 kg)    Wt Readings from Last 3 Encounters:  12/02/16 188 lb 3.2 oz (85.4 kg)  11/23/16 186 lb 9.6 oz (84.6 kg)  11/16/16 185 lb (83.9 kg)

## 2016-12-02 NOTE — Progress Notes (Signed)
  Radiation Oncology         (336) [phone] ________________________________  Name: Raven Marks MRN: [id number]  Date: 12/02/2016  DOB: 1956/07/11  Weekly Radiation Therapy Management    ICD-9-CM ICD-10-CM   1. Malignant neoplasm of upper-outer quadrant of right breast in female, estrogen receptor positive (HCC) 174.4 C50.411    V86.0 Z17.0      Current Dose: 32.4 Gy     Planned Dose:  60.4 Gy  Narrative . . . . . . . . The patient has completed her 18th fraction to the right breast. She denies pain. Reports fatigue at the end of the day but she continues to work daily. She note itching of the neck and chest have improved with hydrocortisone. Per nursing, small rash on chest and left arm noted as well as hyperpigmentation to the right breast with some erythema.                                   The patient is without complaint.                                 Set-up films were reviewed.                                 The chart was checked. Physical Findings. . .  weight is 188 lb 3.2 oz (85.4 kg). Her oral temperature is 98.2 F (36.8 C). Her blood pressure is 132/82 and her pulse is 72. Her respiration is 18 and oxygen saturation is 98%. . The lungs are clear. The heart has regular rhythm and rate. The right breast area shows erythema and hyperpigmentation changes with no skin breakdown. The upper central anterior chest and lower neck patient has an improving macular rash.  Impression . . . . . . . The patient is tolerating radiation. The rash is doing better. Plan . . . . . . . . . . . . Continue treatment as planned. She will continue using Hydrocortisone for her rash and Biafine for her treatment area.  ________________________________   Blair Promise, PhD, MD  This document serves as a record of services personally performed by Gery Pray, MD. It was created on his behalf by Bethann Humble, a trained medical scribe. The creation of this record is based on the scribe's personal  observations and the provider's statements to them. This document has been checked and approved by the attending provider.

## 2016-12-03 ENCOUNTER — Ambulatory Visit: Payer: BC Managed Care – PPO

## 2016-12-03 ENCOUNTER — Ambulatory Visit
Admission: RE | Admit: 2016-12-03 | Discharge: 2016-12-03 | Disposition: A | Discharge status: Home / Self Care | Payer: BC Managed Care – PPO | Source: Ambulatory Visit | Attending: Radiation Oncology | Admitting: Radiation Oncology

## 2016-12-03 DIAGNOSIS — C50411 Malignant neoplasm of upper-outer quadrant of right female breast: Secondary | ICD-10-CM | POA: Diagnosis not present

## 2016-12-06 ENCOUNTER — Ambulatory Visit: Payer: BC Managed Care – PPO

## 2016-12-07 ENCOUNTER — Ambulatory Visit
Admission: RE | Admit: 2016-12-07 | Discharge: 2016-12-07 | Disposition: A | Discharge status: Home / Self Care | Payer: BC Managed Care – PPO | Source: Ambulatory Visit | Attending: Radiation Oncology | Admitting: Radiation Oncology

## 2016-12-07 ENCOUNTER — Ambulatory Visit: Payer: BC Managed Care – PPO

## 2016-12-07 VITALS — BP 131/77 | HR 73 | Temp 98.2°F | Resp 20 | Wt 188.4 lb

## 2016-12-07 DIAGNOSIS — C50411 Malignant neoplasm of upper-outer quadrant of right female breast: Secondary | ICD-10-CM | POA: Diagnosis not present

## 2016-12-07 DIAGNOSIS — Z170 Estrogen receptor positive status [ER+]: Principal | ICD-10-CM

## 2016-12-07 NOTE — Progress Notes (Signed)
  Radiation Oncology         (336) [phone] ________________________________  Name: Raven Marks MRN: [id number]  Date: 12/07/2016  DOB: 16-Apr-1956  Weekly Radiation Therapy Management    ICD-9-CM ICD-10-CM   1. Malignant neoplasm of upper-outer quadrant of right breast in female, estrogen receptor positive (HCC) 174.4 C50.411    V86.0 Z17.0      Current Dose: 36 Gy     Planned Dose:  60.4 Gy  Narrative . . . . . . . . The patient has completed 20 fractions to the right breast. She is accompanied today by her husband. She denies any pain. She reports she is fatigued, but rests often to alleviate this. The patient reports her rash is improving with hydrocortisone cream.                                    The patient is without complaint.                                 Set-up films were reviewed.                                 The chart was checked. Physical Findings. . .  weight is 188 lb 6.4 oz (85.5 kg). Her oral temperature is 98.2 F (36.8 C). Her blood pressure is 131/77 and her pulse is 73. Her respiration is 20 and oxygen saturation is 97%.  The lungs are clear to auscultation bilaterally. The heart is regular in rate and rhythm. Some hyperpigmentation changes and erythema. No skin breakdown.  Impression . . . . . . . The patient is tolerating radiation. The rash is much better. Plan . . . . . . . . . . . . Continue treatment as planned.   ________________________________   Blair Promise, PhD, MD  This document serves as a record of services personally performed by Gery Pray, MD. It was created on his behalf by Maryla Morrow, a trained medical scribe. The creation of this record is based on the scribe's personal observations and the provider's statements to them. This document has been checked and approved by the attending provider.

## 2016-12-07 NOTE — Progress Notes (Signed)
Shayle Donahoo completed 20th fraction to right breast.  Denies any pain.  Patient states she is fatigued but is resting often.  Patient right breast is pink and hyperpigmented.  Patient states rash is improving with the hydrocortisone cream.  Vitals:   12/07/16 1638  BP: 131/77  Pulse: 73  Resp: 20  Temp: 98.2 F (36.8 C)  TempSrc: Oral  SpO2: 97%  Weight: 188 lb 6.4 oz (85.5 kg)    Wt Readings from Last 3 Encounters:  12/07/16 188 lb 6.4 oz (85.5 kg)  12/02/16 188 lb 3.2 oz (85.4 kg)  11/23/16 186 lb 9.6 oz (84.6 kg)

## 2016-12-08 ENCOUNTER — Ambulatory Visit: Payer: BC Managed Care – PPO

## 2016-12-08 ENCOUNTER — Ambulatory Visit
Admission: RE | Admit: 2016-12-08 | Discharge: 2016-12-08 | Disposition: A | Discharge status: Home / Self Care | Payer: BC Managed Care – PPO | Source: Ambulatory Visit | Attending: Radiation Oncology | Admitting: Radiation Oncology

## 2016-12-08 DIAGNOSIS — C50411 Malignant neoplasm of upper-outer quadrant of right female breast: Secondary | ICD-10-CM | POA: Diagnosis not present

## 2016-12-09 ENCOUNTER — Ambulatory Visit: Payer: BC Managed Care – PPO

## 2016-12-09 ENCOUNTER — Ambulatory Visit
Admission: RE | Admit: 2016-12-09 | Discharge: 2016-12-09 | Disposition: A | Discharge status: Home / Self Care | Payer: BC Managed Care – PPO | Source: Ambulatory Visit | Attending: Radiation Oncology | Admitting: Radiation Oncology

## 2016-12-09 DIAGNOSIS — C50411 Malignant neoplasm of upper-outer quadrant of right female breast: Secondary | ICD-10-CM | POA: Diagnosis not present

## 2016-12-10 ENCOUNTER — Ambulatory Visit: Payer: BC Managed Care – PPO

## 2016-12-13 ENCOUNTER — Ambulatory Visit
Admission: RE | Admit: 2016-12-13 | Discharge: 2016-12-13 | Disposition: A | Discharge status: Home / Self Care | Payer: BC Managed Care – PPO | Source: Ambulatory Visit | Attending: Radiation Oncology | Admitting: Radiation Oncology

## 2016-12-13 ENCOUNTER — Ambulatory Visit: Payer: BC Managed Care – PPO

## 2016-12-13 DIAGNOSIS — C50411 Malignant neoplasm of upper-outer quadrant of right female breast: Secondary | ICD-10-CM | POA: Diagnosis not present

## 2016-12-14 ENCOUNTER — Ambulatory Visit
Admission: RE | Admit: 2016-12-14 | Discharge: 2016-12-14 | Disposition: A | Discharge status: Home / Self Care | Payer: BC Managed Care – PPO | Source: Ambulatory Visit | Attending: Radiation Oncology | Admitting: Radiation Oncology

## 2016-12-14 ENCOUNTER — Ambulatory Visit: Payer: BC Managed Care – PPO

## 2016-12-14 ENCOUNTER — Encounter: Payer: Self-pay | Admitting: Radiation Oncology

## 2016-12-14 VITALS — BP 137/72 | HR 72 | Temp 97.9°F | Ht 62.0 in | Wt 188.0 lb

## 2016-12-14 DIAGNOSIS — C50411 Malignant neoplasm of upper-outer quadrant of right female breast: Secondary | ICD-10-CM

## 2016-12-14 DIAGNOSIS — Z170 Estrogen receptor positive status [ER+]: Principal | ICD-10-CM

## 2016-12-14 NOTE — Progress Notes (Signed)
Raven Marks has completed 24 fractions to her right breast.  She denies having pain but does have fatigue.  She is using radiaplex gel as direct.  The skin on her right breast is red.  BP 137/72 (BP Location: Left Arm, Patient Position: Sitting)   Pulse 72   Temp 97.9 F (36.6 C) (Oral)   Ht 5\' 2"  (1.575 m)   Wt 188 lb (85.3 kg)   LMP 09/27/2010   SpO2 98%   BMI 34.39 kg/m    Wt Readings from Last 3 Encounters:  12/14/16 188 lb (85.3 kg)  12/07/16 188 lb 6.4 oz (85.5 kg)  12/02/16 188 lb 3.2 oz (85.4 kg)

## 2016-12-14 NOTE — Progress Notes (Signed)
  Radiation Oncology         (336) [phone] ________________________________  Name: Raven Marks MRN: [id number]  Date: 12/14/2016  DOB: 04/20/1956  Weekly Radiation Therapy Management    ICD-9-CM ICD-10-CM   1. Malignant neoplasm of upper-outer quadrant of right breast in female, estrogen receptor positive (HCC) 174.4 C50.411    V86.0 Z17.0      Current Dose: 43.2 Gy     Planned Dose:  60.4 Gy  Narrative . . . . . . . . The patient has completed 24 fractions to the right breast. Pt denies pain but endorses fatigue. She endorses use of radiaplex gel. Pt expresses concern for scheduling treatments considering upcoming trip but understands she can double up.                                    The patient is without complaint.                                 Set-up films were reviewed.                                 The chart was checked. Physical Findings. . .  height is 5\' 2"  (1.575 m) and weight is 188 lb (85.3 kg). Her oral temperature is 97.9 F (36.6 C). Her blood pressure is 137/72 and her pulse is 72. Her oxygen saturation is 98%.  The lungs are clear to auscultation bilaterally. The heart is regular in rate and rhythm. Erythema and hyperpigmentation changes in right breast with no skin breakdown. Impression . . . . . . . The patient is tolerating radiation.  Plan . . . . . . . . . . . . Continue treatment as planned.   ________________________________   Blair Promise, PhD, MD  This document serves as a record of services personally performed by Gery Pray, MD. It was created on his behalf by Maryla Morrow, a trained medical scribe. The creation of this record is based on the scribe's personal observations and the provider's statements to them. This document has been checked and approved by the attending provider.

## 2016-12-15 ENCOUNTER — Ambulatory Visit: Payer: BC Managed Care – PPO

## 2016-12-15 ENCOUNTER — Ambulatory Visit
Admission: RE | Admit: 2016-12-15 | Discharge: 2016-12-15 | Disposition: A | Discharge status: Home / Self Care | Payer: BC Managed Care – PPO | Source: Ambulatory Visit | Attending: Radiation Oncology | Admitting: Radiation Oncology

## 2016-12-15 DIAGNOSIS — C50411 Malignant neoplasm of upper-outer quadrant of right female breast: Secondary | ICD-10-CM | POA: Diagnosis not present

## 2016-12-16 ENCOUNTER — Ambulatory Visit: Payer: BC Managed Care – PPO | Admitting: Radiation Oncology

## 2016-12-16 ENCOUNTER — Ambulatory Visit: Payer: BC Managed Care – PPO

## 2016-12-16 ENCOUNTER — Ambulatory Visit
Admission: RE | Admit: 2016-12-16 | Discharge: 2016-12-16 | Disposition: A | Discharge status: Home / Self Care | Payer: BC Managed Care – PPO | Source: Ambulatory Visit | Attending: Radiation Oncology | Admitting: Radiation Oncology

## 2016-12-16 DIAGNOSIS — C50411 Malignant neoplasm of upper-outer quadrant of right female breast: Secondary | ICD-10-CM | POA: Diagnosis not present

## 2016-12-17 ENCOUNTER — Ambulatory Visit
Admission: RE | Admit: 2016-12-17 | Discharge: 2016-12-17 | Disposition: A | Discharge status: Home / Self Care | Payer: BC Managed Care – PPO | Source: Ambulatory Visit | Attending: Radiation Oncology | Admitting: Radiation Oncology

## 2016-12-17 ENCOUNTER — Ambulatory Visit
Admission: RE | Admit: 2016-12-17 | Payer: BC Managed Care – PPO | Source: Ambulatory Visit | Admitting: Radiation Oncology

## 2016-12-17 ENCOUNTER — Ambulatory Visit: Payer: BC Managed Care – PPO

## 2016-12-17 DIAGNOSIS — C50411 Malignant neoplasm of upper-outer quadrant of right female breast: Secondary | ICD-10-CM | POA: Diagnosis not present

## 2016-12-20 ENCOUNTER — Ambulatory Visit
Admission: RE | Admit: 2016-12-20 | Discharge: 2016-12-20 | Disposition: A | Discharge status: Home / Self Care | Payer: BC Managed Care – PPO | Source: Ambulatory Visit | Attending: Radiation Oncology | Admitting: Radiation Oncology

## 2016-12-20 ENCOUNTER — Ambulatory Visit: Payer: BC Managed Care – PPO | Admitting: Radiation Oncology

## 2016-12-20 ENCOUNTER — Ambulatory Visit
Admission: RE | Admit: 2016-12-20 | Payer: BC Managed Care – PPO | Source: Ambulatory Visit | Admitting: Radiation Oncology

## 2016-12-20 ENCOUNTER — Ambulatory Visit: Payer: BC Managed Care – PPO

## 2016-12-20 DIAGNOSIS — C50411 Malignant neoplasm of upper-outer quadrant of right female breast: Secondary | ICD-10-CM | POA: Diagnosis not present

## 2016-12-20 DIAGNOSIS — Z170 Estrogen receptor positive status [ER+]: Principal | ICD-10-CM

## 2016-12-20 NOTE — Progress Notes (Signed)
  Radiation Oncology         [phone] ________________________________  Name: Raven Marks MRN: [id number]  Date: 12/20/2016  DOB: April 24, 1956  Electron Simulation/setup check Note    ICD-9-CM ICD-10-CM   1. Malignant neoplasm of upper-outer quadrant of right breast in female, estrogen receptor positive ([place]) 174.4 C50.411    V86.0 Z17.0     Status: outpatient  NARRATIVE: The patient was brought to the treatment unit and placed in the planned treatment position. The clinical setup was verified. The patient then had set up of her custom electron cutout field encompassing the right breast target area. This was verified as being accurate compared to her imaging and plan. -----------------------------------  Blair Promise, PhD, MD

## 2016-12-21 ENCOUNTER — Ambulatory Visit: Payer: BC Managed Care – PPO

## 2016-12-21 ENCOUNTER — Ambulatory Visit
Admission: RE | Admit: 2016-12-21 | Discharge: 2016-12-21 | Disposition: A | Discharge status: Home / Self Care | Payer: BC Managed Care – PPO | Source: Ambulatory Visit | Attending: Radiation Oncology | Admitting: Radiation Oncology

## 2016-12-21 VITALS — BP 124/72 | HR 76 | Temp 98.1°F | Resp 20 | Wt 188.8 lb

## 2016-12-21 DIAGNOSIS — C50411 Malignant neoplasm of upper-outer quadrant of right female breast: Secondary | ICD-10-CM

## 2016-12-21 DIAGNOSIS — Z170 Estrogen receptor positive status [ER+]: Principal | ICD-10-CM

## 2016-12-21 NOTE — Progress Notes (Signed)
Raven Marks completed 29th fraction of radiation to right breast today.  Patient denies having any pain.  Patient denies any issues with appetite.  Reports having a great deal of fatigue.  Skin to right breast is clean intact and dry.  Skin to right breast has a great deal of hyperpigmentation.  Denies any itching, rash or pain in this area.   Vitals:   12/21/16 1646  BP: 124/72  Pulse: 76  Resp: 20  Temp: 98.1 F (36.7 C)  TempSrc: Oral  SpO2: 98%  Weight: 188 lb 12.8 oz (85.6 kg)    Wt Readings from Last 3 Encounters:  12/21/16 188 lb 12.8 oz (85.6 kg)  12/14/16 188 lb (85.3 kg)  12/07/16 188 lb 6.4 oz (85.5 kg)

## 2016-12-21 NOTE — Progress Notes (Signed)
  Radiation Oncology         [phone] ________________________________  Name: Raven Marks MRN: [id number]  Date: 12/21/2016  DOB: 1955-11-08  Weekly Radiation Therapy Management    ICD-9-CM ICD-10-CM   1. Malignant neoplasm of upper-outer quadrant of right breast in female, estrogen receptor positive (HCC) 174.4 C50.411    V86.0 Z17.0      Current Dose: 52.4 Gy     Planned Dose:  60.4 Gy  Narrative . . . . . . . .The patient presents for routine under treatment assessment.     Raven Marks completed her 29th fraction of radiation to right breast. Patient denies having any pain or issues with appetite.  Reports having a great deal of fatigue. Skin to right breast is clean intact and dry. Skin to right breast has a great deal of hyperpigmentation. The patient denies any itching, rash, or pain in this area.                                   Set-up films were reviewed.                                 The chart was checked. Physical Findings. . .  weight is 188 lb 12.8 oz (85.6 kg). Her oral temperature is 98.1 F (36.7 C). Her blood pressure is 124/72 and her pulse is 76. Her respiration is 20 and oxygen saturation is 98%.  The lungs are clear to auscultation bilaterally. The heart is regular in rate and rhythm. Erythema and hyperpigmentation changes in right breast with no skin breakdown. Impression . . . . . . . The patient is tolerating radiation.  Plan . . . . . . . . . . . . Continue treatment as planned. I will see the patient this Thursday for a weekly under treatment assessment. She will have 2 fractions on Friday to complete her treatment before next week. ________________________________   Blair Promise, PhD, MD  This document serves as a record of services personally performed by Gery Pray, MD. It was created on his behalf by Darcus Austin, a trained medical scribe. The creation of this record is based on the scribe's personal observations and the provider's  statements to them. This document has been checked and approved by the attending provider.

## 2016-12-22 ENCOUNTER — Ambulatory Visit: Payer: BC Managed Care – PPO

## 2016-12-22 ENCOUNTER — Ambulatory Visit
Admission: RE | Admit: 2016-12-22 | Discharge: 2016-12-22 | Disposition: A | Discharge status: Home / Self Care | Payer: BC Managed Care – PPO | Source: Ambulatory Visit | Attending: Radiation Oncology | Admitting: Radiation Oncology

## 2016-12-22 DIAGNOSIS — C50411 Malignant neoplasm of upper-outer quadrant of right female breast: Secondary | ICD-10-CM | POA: Diagnosis not present

## 2016-12-23 ENCOUNTER — Ambulatory Visit
Admission: RE | Admit: 2016-12-23 | Discharge: 2016-12-23 | Disposition: A | Discharge status: Home / Self Care | Payer: BC Managed Care – PPO | Source: Ambulatory Visit | Attending: Radiation Oncology | Admitting: Radiation Oncology

## 2016-12-23 ENCOUNTER — Encounter: Payer: Self-pay | Admitting: Radiation Oncology

## 2016-12-23 ENCOUNTER — Ambulatory Visit: Payer: BC Managed Care – PPO

## 2016-12-23 VITALS — BP 125/79 | HR 81 | Temp 98.4°F | Resp 18 | Ht 62.0 in | Wt 189.6 lb

## 2016-12-23 DIAGNOSIS — C541 Malignant neoplasm of endometrium: Secondary | ICD-10-CM | POA: Diagnosis present

## 2016-12-23 DIAGNOSIS — C50411 Malignant neoplasm of upper-outer quadrant of right female breast: Secondary | ICD-10-CM | POA: Insufficient documentation

## 2016-12-23 DIAGNOSIS — Z923 Personal history of irradiation: Secondary | ICD-10-CM | POA: Insufficient documentation

## 2016-12-23 MED ORDER — RADIAPLEXRX EX GEL
Freq: Once | CUTANEOUS | Status: AC
  Administered 2016-12-23: 17:00:00 via TOPICAL

## 2016-12-23 NOTE — Progress Notes (Addendum)
Maydelin Deming completed 31th fraction of radiation to right breast today.  Patient denies having any pain.  Patient denies any issues with appetite.  Reports having a great deal of fatigue.    Skin to right breast has a great deal of hyperpigmentation, skin intact.   Denies any itching, rash or pain in this area.   EOT instructions given and one month follow up card to see Dr. Sondra Come in one month.  New tube of Radiaples gel given. Wt Readings from Last 3 Encounters:  12/23/16 189 lb 9.6 oz (86 kg)  12/21/16 188 lb 12.8 oz (85.6 kg)  12/14/16 188 lb (85.3 kg)  BP 125/79   Pulse 81   Temp 98.4 F (36.9 C) (Oral)   Resp 18   Ht 5\' 2"  (1.575 m)   Wt 189 lb 9.6 oz (86 kg)   LMP 09/27/2010   SpO2 95%   BMI 34.68 kg/m

## 2016-12-23 NOTE — Progress Notes (Signed)
  Radiation Oncology         (336) [phone] ________________________________  Name: Raven Marks MRN: [id number]  Date: 12/23/2016  DOB: July 18, 1956  Weekly Radiation Therapy Management    ICD-9-CM ICD-10-CM   1. Malignant neoplasm of upper-outer quadrant of right female breast, unspecified estrogen receptor status (HCC) 174.4 C50.411 hyaluronate sodium (RADIAPLEXRX) gel  2. Endometrial adenocarcinoma (HCC) 182.0 C54.1      Current Dose: 56.4 Gy     Planned Dose:  60.4 Gy  Narrative . . . . . . . .The patient presents for routine under treatment assessment.     Raven Marks has completed 31 fractions of radiation to right breast. The patient denies pain. She denies appetite changes or concerns. She reports significant fatigue. She denies itching or pain to the treatment fields.                                   Set-up films were reviewed.                                 The chart was checked. Physical Findings. . .  height is 5\' 2"  (1.575 m) and weight is 189 lb 9.6 oz (86 kg). Her oral temperature is 98.4 F (36.9 C). Her blood pressure is 125/79 and her pulse is 81. Her respiration is 18 and oxygen saturation is 95%.  The lungs are clear to auscultation bilaterally. The heart is regular in rate and rhythm. Erythema and hyperpigmentation changes in right breast with no skin breakdown. Impression . . . . . . . The patient is tolerating radiation.  Plan . . . . . . . . . . . . Continue treatment as planned. She will have 2 fractions tomorrow to complete her treatment before next week. The patient will follow up in one month. She will call the clinic sooner if any questions or concerns arise.  ________________________________   Blair Promise, PhD, MD  This document serves as a record of services personally performed by Gery Pray, MD. It was created on his behalf by Maryla Morrow, a trained medical scribe. The creation of this record is based on the scribe's personal  observations and the provider's statements to them. This document has been checked and approved by the attending provider.

## 2016-12-24 ENCOUNTER — Ambulatory Visit
Admission: RE | Admit: 2016-12-24 | Discharge: 2016-12-24 | Disposition: A | Discharge status: Home / Self Care | Payer: BC Managed Care – PPO | Source: Ambulatory Visit | Attending: Radiation Oncology | Admitting: Radiation Oncology

## 2016-12-24 ENCOUNTER — Encounter: Payer: Self-pay | Admitting: Radiation Oncology

## 2016-12-24 DIAGNOSIS — C50411 Malignant neoplasm of upper-outer quadrant of right female breast: Secondary | ICD-10-CM | POA: Diagnosis not present

## 2016-12-27 ENCOUNTER — Ambulatory Visit: Admission: RE | Admit: 2016-12-27 | Payer: BC Managed Care – PPO | Source: Ambulatory Visit

## 2016-12-28 NOTE — Progress Notes (Signed)
  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 There was erythema and hyperpigmentation changes of the right breast with no skin breakdown. The patient had significant fatigue. She denies any itching or pain of the treatment area.  Plan: The patient has completed radiation treatment. The patient will return to radiation oncology clinic for routine followup in one month. I advised them to call or return sooner if they have any questions or concerns related to their recovery or treatment.  -----------------------------------  Blair Promise, PhD, MD  This document serves as a record of services personally performed by Gery Pray, MD. It was created on his behalf by Darcus Austin, a trained medical scribe. The creation of this record is based on the scribe's personal observations and the provider's statements to them. This document has been checked and approved by the attending provider.

## 2017-02-14 ENCOUNTER — Encounter: Payer: Self-pay | Admitting: Oncology

## 2017-02-17 ENCOUNTER — Encounter: Payer: Self-pay | Admitting: Radiation Oncology

## 2017-02-17 ENCOUNTER — Ambulatory Visit
Admission: RE | Admit: 2017-02-17 | Discharge: 2017-02-17 | Disposition: A | Discharge status: Home / Self Care | Payer: BC Managed Care – PPO | Source: Ambulatory Visit | Attending: Radiation Oncology | Admitting: Radiation Oncology

## 2017-02-17 DIAGNOSIS — Z885 Allergy status to narcotic agent status: Secondary | ICD-10-CM | POA: Diagnosis not present

## 2017-02-17 DIAGNOSIS — L810 Postinflammatory hyperpigmentation: Secondary | ICD-10-CM | POA: Diagnosis not present

## 2017-02-17 DIAGNOSIS — C50411 Malignant neoplasm of upper-outer quadrant of right female breast: Secondary | ICD-10-CM

## 2017-02-17 DIAGNOSIS — Z880 Allergy status to penicillin: Secondary | ICD-10-CM | POA: Insufficient documentation

## 2017-02-17 DIAGNOSIS — Z9889 Other specified postprocedural states: Secondary | ICD-10-CM | POA: Diagnosis not present

## 2017-02-17 DIAGNOSIS — R21 Rash and other nonspecific skin eruption: Secondary | ICD-10-CM | POA: Insufficient documentation

## 2017-02-17 NOTE — Progress Notes (Addendum)
Raven Marks is here for follow up.  She denies having any pain.  She does having a scattered, red rash over her chest, arms, neck and abdomen.  She thinks this is due to herceptin which she had on 5/18 at [facility].  She is using hydrocortisone cream.  She reports having fatigue.  The skin on her right breast has slight hyperpigmentation.  She was given antifungal powder and was educated on how to use it.  BP (!) 136/93 (BP Location: Left Arm, Patient Position: Sitting)   Pulse 78   Temp 98.3 F (36.8 C) (Oral)   Ht 5\' 2"  (1.575 m)   Wt 190 lb (86.2 kg)   LMP 09/27/2010   SpO2 97%   BMI 34.75 kg/m    Wt Readings from Last 3 Encounters:  02/17/17 190 lb (86.2 kg)  12/23/16 189 lb 9.6 oz (86 kg)  12/21/16 188 lb 12.8 oz (85.6 kg)

## 2017-02-17 NOTE — Progress Notes (Signed)
Radiation Oncology         [phone] ________________________________  Name: Raven Marks MRN: [id number]  Date: 02/17/2017  DOB: 03/07/1956  Follow-Up Visit Note  CC: Lawerance Cruel, MD  [facility]*    ICD-9-CM ICD-10-CM   1. 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  She denies having any pain.  She does having a scattered, red rash over her chest, arms, neck and abdomen.  She thinks this is due to herceptin which she had on 5/18 at [facility]. This has happened to her before. She has two more treatments. She is using hydrocortisone cream but this is not offering adequate treatment. The itching is worse during the night. She reports having fatigue, but reports to be slowly getting her energy back.                             ALLERGIES:  is allergic to codeine; hydromorphone; morphine; and penicillins.  Meds: Current Outpatient Prescriptions  Medication Sig Dispense Refill  . Acetaminophen 500 MG coapsule Take by mouth.    . hydrocortisone cream 1 % Apply 1 application topically 2 (two) times daily.    . Loratadine (CLARITIN PO) Take 10 mg by mouth.    . meclizine (ANTIVERT) 50 MG tablet Take 1 tablet (50 mg total) by mouth 3 (three) times daily as needed. 30 tablet 2  . omeprazole (PRILOSEC) 10 MG capsule Take 10 mg by mouth daily.    Vladimir Faster Glycol-Propyl Glycol (SYSTANE OP) Apply to eye 3 (three) times daily.    . beta carotene w/minerals (OCUVITE) tablet Take 1 tablet by mouth daily.    . hydrochlorothiazide (HYDRODIURIL) 25 MG tablet TAKE 1 TABLET (25 MG TOTAL) BY MOUTH ONCE DAILY.  0  . nystatin cream (MYCOSTATIN) Apply 1 application topically 2 (two) times  daily. (Patient not taking: Reported on 12/23/2016) 30 g 2  . ondansetron (ZOFRAN) 8 MG tablet Take 1 tablet (8 mg) by mouth every 12 hours PRN nausea     No current facility-administered medications for this encounter.     Physical Findings: The patient is in no acute distress. Patient is alert and oriented.  height is 5\' 2"  (1.575 m) and weight is 190 lb (86.2 kg). Her oral temperature is 98.3 F (36.8 C). Her blood pressure is 136/93 (abnormal) and her pulse is 78. Her oxygen saturation is 97%. .  Lungs are clear to auscultation bilaterally. Heart has regular rate and rhythm. No palpable cervical, supraclavicular, or axillary adenopathy. Abdomen soft, non-tender, normal bowel sounds. Left breast: No palpable masses or nipple discharge. Right breast: hyperpigmentation changes. Skin is well healed. No palpable masses, bleeding, or nipple discharge. Pt has an erythematous rash in her right inframammary fold. Looks like a fungal infection.  Lab Findings: Lab Results  Component Value Date   WBC 5.7 01/14/2016   HGB 13.4 01/14/2016   HCT 41.3 01/14/2016   MCV 89.6 01/14/2016   PLT 276 01/14/2016    Radiographic Findings: No results found.  Impression:  The patient is recovering from the effects of radiation.  Continues to have some fatigue at this time. No evidence of recurrence.  Pt has been provided antifungal powder for rash.  Plan:  Prn f/u with radiation oncology. She will continue close f/u at [facility] and continues on Herceptin.     -----------------------------------  Blair Promise, PhD, MD  This document serves as a record of services personally performed by Gery Pray, MD. It was created on his behalf by Linward Natal, a trained medical scribe. The creation of this record is based on the scribe's personal observations and the provider's statements to them. This document has been checked and approved by the attending provider.

## 2017-05-20 ENCOUNTER — Other Ambulatory Visit: Payer: Self-pay | Admitting: Neurological Surgery

## 2017-05-20 DIAGNOSIS — M4712 Other spondylosis with myelopathy, cervical region: Secondary | ICD-10-CM

## 2017-06-16 ENCOUNTER — Ambulatory Visit
Admission: RE | Admit: 2017-06-16 | Discharge: 2017-06-16 | Disposition: A | Discharge status: Home / Self Care | Payer: BC Managed Care – PPO | Source: Ambulatory Visit | Attending: Neurological Surgery | Admitting: Neurological Surgery

## 2017-06-16 DIAGNOSIS — M4712 Other spondylosis with myelopathy, cervical region: Secondary | ICD-10-CM

## 2017-06-30 ENCOUNTER — Ambulatory Visit
Admission: RE | Admit: 2017-06-30 | Discharge: 2017-06-30 | Disposition: A | Discharge status: Home / Self Care | Payer: BC Managed Care – PPO | Source: Ambulatory Visit | Attending: Neurological Surgery | Admitting: Neurological Surgery

## 2017-11-23 ENCOUNTER — Encounter: Payer: Self-pay | Admitting: Obstetrics & Gynecology

## 2017-11-23 ENCOUNTER — Ambulatory Visit: Payer: BC Managed Care – PPO | Admitting: Obstetrics & Gynecology

## 2017-11-23 VITALS — BP 122/88 | HR 70 | Resp 18 | Ht 64.0 in | Wt 193.0 lb

## 2017-11-23 DIAGNOSIS — Z01419 Encounter for gynecological examination (general) (routine) without abnormal findings: Secondary | ICD-10-CM | POA: Diagnosis not present

## 2017-11-23 MED ORDER — TRIAMCINOLONE ACETONIDE 0.1 % EX LOTN: 1.0000 "application " | TOPICAL_LOTION | Freq: Three times a day (TID) | CUTANEOUS | 0 refills | Status: DC

## 2017-11-23 NOTE — Progress Notes (Signed)
62 y.o. G1P1 MarriedCaucasianF here for annual exam.  Doing well except having some back issues.  Feels like her chemo caused worsening spinal stenosis.  Going back to work on Monday.  She is going to retire after the end of this year.    Now on every six month follow up at [facility].  Had lumpectomy, chemotherapy, radiation.  Did Herceptin for 8 months.  Developed a rash with the last injection.  So, this was stopped.  ER/PR/Her 2 positive.  Did genetic testing at [facility] that was negative.    PCP:  Dr. Harrington Challenger.    Patient's last menstrual period was 09/27/2010.          Sexually active: Yes.    The current method of family planning is status post hysterectomy.    Exercising: No.   Smoker:  no  Health Maintenance: Pap:  10/11/16 Neg   10/02/15 Neg  History of abnormal Pap:  yes MMG:  09/28/17.  At [facility].  Reviewed in [place] Colonoscopy:  2015 Normal. F/u 3 years. Dr. Cristina Gong.  Has seen Dr. Cristina Gong.  Planned for this summer.   BMD:   09/17/16 Osteopenia. Care everywhere, -0.4/ -1.2 TDaP:  Current  Pneumonia vaccine(s): No Shingrix:   No Hep C testing: Unsure  Screening Labs: PCP   reports that  has never smoked. she has never used smokeless tobacco. She reports that she does not drink alcohol or use drugs.  Past Medical History:  Diagnosis Date  . Cancer of central portion of right female breast ([place]) 01/08/2016   stage 1A  . Complication of anesthesia    vertigo  . Congenital absence of femur    right  . Endometrioid adenocarcinoma    Stage IA, grade 2  . GERD (gastroesophageal reflux disease)   . History of external beam radiation therapy 10/27/16-12/24/16   right breast 50.4 Gy in 28 fracitons, boost 10 Gy in 5 fractions  . PONV (postoperative nausea and vomiting)   . 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 Spinal stenosis   . Vertigo    associated with anesthesia  . Vitamin D deficiency 09/2012    Past Surgical History:  Procedure Laterality Date   . ABDOMINAL HYSTERECTOMY  10/08/2010   Robotic, BSO, bilateral pelvic/ R periaortic LND  . CESAREAN SECTION    . CHOLECYSTECTOMY    . CHOLECYSTECTOMY N/A 10/11/2013   Procedure: LAPAROSCOPIC CHOLECYSTECTOMY WITH INTRAOPERATIVE CHOLANGIOGRAM;  Surgeon: Joyice Faster. Cornett, MD;  Location: [facility];  Service: General;  Laterality: N/A;  . ERCP N/A 10/12/2013   Procedure: ENDOSCOPIC RETROGRADE CHOLANGIOPANCREATOGRAPHY (ERCP);  Surgeon: Missy Sabins, MD;  Location[facility] ENDOSCOPY;  Service: Endoscopy;  Laterality: N/A;  . PARTIAL MASTECTOMY WITH AXILLARY SENTINEL LYMPH NODE BIOPSY Right 03/09/16   at [facility]  . REPLACEMENT TOTAL KNEE     Left    Current Outpatient Medications  Medication Sig Dispense Refill  . Acetaminophen 500 MG coapsule Take by mouth.    . beta carotene w/minerals (OCUVITE) tablet Take 1 tablet by mouth daily.    . diclofenac sodium (VOLTAREN) 1 % GEL as needed.    . meclizine (ANTIVERT) 50 MG tablet Take 1 tablet (50 mg total) by mouth 3 (three) times daily as needed. 30 tablet 2  . omeprazole (PRILOSEC) 10 MG capsule Take 10 mg by mouth daily.    Vladimir Faster Glycol-Propyl Glycol (SYSTANE OP) Apply to eye 3 (three) times daily.  No current facility-administered medications for this visit.     Family History  Problem Relation Age of Onset  . Colon cancer Mother   . Colon cancer Father   . Asthma Paternal Grandmother   . Breast cancer Maternal Grandmother   . Breast cancer Maternal Aunt     ROS:  Pertinent items are noted in HPI.  Otherwise, a comprehensive ROS was negative.  Exam:   BP 122/88 (BP Location: Right Arm, Patient Position: Sitting, Cuff Size: Large)   Pulse 70   Resp 18   Ht 5\' 4"  (1.626 m)   Wt 193 lb (87.5 kg)   LMP 09/27/2010   BMI 33.13 kg/m   Weight change: +7#   Height: 5\' 4"  (162.6 cm)  Ht Readings from Last 3 Encounters:  11/23/17 5\' 4"  (1.626 m)  02/17/17 5\' 2"  (1.575 m)  12/23/16 5\' 2"  (1.575 m)    General appearance: alert,  cooperative and appears stated age Head: Normocephalic, without obvious abnormality, atraumatic Neck: no adenopathy, supple, symmetrical, trachea midline and thyroid normal to inspection and palpation Lungs: clear to auscultation bilaterally Breasts: well healed right breast scar and axillary scar, no breast mass noted on right; normal left breast without masses, skin changes, nipple discharge, LAD Heart: regular rate and rhythm Abdomen: soft, non-tender; bowel sounds normal; no masses,  no organomegaly Extremities: extremities normal, atraumatic, no cyanosis or edema Skin: Skin color, texture, turgor normal. No rashes or lesions Lymph nodes: Cervical, supraclavicular, and axillary nodes normal. No abnormal inguinal nodes palpated Neurologic: Grossly normal   Pelvic: External genitalia:  no lesions              Urethra:  normal appearing urethra with no masses, tenderness or lesions              Bartholins and Skenes: normal                 Vagina: normal appearing vagina with normal color and discharge, no lesions              Cervix: absent              Pap taken: No. Bimanual Exam:  Uterus:  uterus absent              Adnexa: no mass, fullness, tenderness               Rectovaginal: Confirms               Anus:  normal sphincter tone, no lesions  Chaperone was present for exam.  A:  Well Woman with normal exam PMP, no HRT Hypertension H/O IB endometrial adenocarcinoma 1/12, s/p TLH/BSO, bilateral LND.  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  Genetic testing was negative.  P:   Mammogram guidelines reviewed.  Doing 3D. pap smear not needed.  Reviewed new guidelines for follow-up after endometrial cancer.  Pt comfortable with this. Rx for triamcinolone ointment to pharmacy Lab  work UTD.  Reviewed vaccinations and Shingrix vaccine.  Wants to wait on getting this at this time. return annually or prn

## 2018-01-10 ENCOUNTER — Ambulatory Visit: Payer: BC Managed Care – PPO | Admitting: Obstetrics & Gynecology

## 2018-04-20 DIAGNOSIS — H8110 Benign paroxysmal vertigo, unspecified ear: Secondary | ICD-10-CM | POA: Insufficient documentation

## 2018-11-28 ENCOUNTER — Telehealth: Payer: Self-pay | Admitting: *Deleted

## 2018-11-28 ENCOUNTER — Other Ambulatory Visit (HOSPITAL_COMMUNITY)
Admission: RE | Admit: 2018-11-28 | Discharge: 2018-11-28 | Disposition: A | Discharge status: Home / Self Care | Payer: Medicare Other | Source: Ambulatory Visit | Attending: Obstetrics & Gynecology | Admitting: Obstetrics & Gynecology

## 2018-11-28 ENCOUNTER — Other Ambulatory Visit: Payer: Self-pay

## 2018-11-28 ENCOUNTER — Encounter: Payer: Self-pay | Admitting: Obstetrics & Gynecology

## 2018-11-28 ENCOUNTER — Ambulatory Visit (INDEPENDENT_AMBULATORY_CARE_PROVIDER_SITE_OTHER): Payer: Medicare Other | Admitting: Obstetrics & Gynecology

## 2018-11-28 VITALS — BP 170/100 | HR 76 | Resp 16 | Wt 195.0 lb

## 2018-11-28 DIAGNOSIS — E559 Vitamin D deficiency, unspecified: Secondary | ICD-10-CM

## 2018-11-28 DIAGNOSIS — C50111 Malignant neoplasm of central portion of right female breast: Secondary | ICD-10-CM

## 2018-11-28 DIAGNOSIS — E7800 Pure hypercholesterolemia, unspecified: Secondary | ICD-10-CM

## 2018-11-28 DIAGNOSIS — C541 Malignant neoplasm of endometrium: Secondary | ICD-10-CM | POA: Insufficient documentation

## 2018-11-28 DIAGNOSIS — Z01419 Encounter for gynecological examination (general) (routine) without abnormal findings: Secondary | ICD-10-CM | POA: Diagnosis not present

## 2018-11-28 DIAGNOSIS — R600 Localized edema: Secondary | ICD-10-CM

## 2018-11-28 DIAGNOSIS — M8580 Other specified disorders of bone density and structure, unspecified site: Secondary | ICD-10-CM

## 2018-11-28 DIAGNOSIS — Z124 Encounter for screening for malignant neoplasm of cervix: Secondary | ICD-10-CM

## 2018-11-28 DIAGNOSIS — R748 Abnormal levels of other serum enzymes: Secondary | ICD-10-CM

## 2018-11-28 DIAGNOSIS — Z170 Estrogen receptor positive status [ER+]: Secondary | ICD-10-CM

## 2018-11-28 DIAGNOSIS — I10 Essential (primary) hypertension: Secondary | ICD-10-CM

## 2018-11-28 DIAGNOSIS — R7989 Other specified abnormal findings of blood chemistry: Secondary | ICD-10-CM

## 2018-11-28 NOTE — Telephone Encounter (Signed)
Her appt is due in June at [facility]. I suggested she see me next March (so please make appt for pt in March, 2021) and then maybe been seen [facility] in July next year so stretch it out a little bit more.

## 2018-11-28 NOTE — Telephone Encounter (Signed)
Return in one year.

## 2018-11-28 NOTE — Progress Notes (Signed)
Appointment with Dr. Harrington Challenger 12/04/2018 at 4:15, pt agreeable. Will call their office directly if any BP concerns prior to appointment.

## 2018-11-28 NOTE — Telephone Encounter (Signed)
Spoke with patient, advised per Dr. Sabra Heck.   1. AEX scheduled for 11/29/19 at 0930  2. Patient states she wants Dr. Sabra Heck to know she moved her appointment with [facility] to 05/2019, originally scheduled 03/2019.  3. B/P elevated in office. Checked her B/P when she got home and settled, back to her normal, will continue to monitor and keep OV with PCP for 3*9 for further evaluation.  Routing to Dr. Lestine Box.   Encounter closed.

## 2018-11-28 NOTE — Progress Notes (Signed)
63 y.o. G1P1 Married White or Caucasian female here for annual exam.  Retired August 1st.  Has been so happy with her decision.  Denies vaginal bleeding.    Having more issues with vertigo this year.  She did see ENT and neurosurgeon.  She has seen PT for vertigo maneuvers that did help.  It is better.  Meclizine works for her.  Colonoscopy was done last Tuesday.  Oncologist at [facility] released pt after last visit 7/19.  She is now seeing Dr. Debbe Bales (surgeon) and me every six months.  Appt is in July.    Having some ankle swelling on the left.  Fluid is typically lower in the morning.  The more she does, the more swelling she has.    PCP:  Dr. Harrington Challenger.  Hasn't been seen for a physical this year.  Patient's last menstrual period was 09/27/2010.          Sexually active: Yes.    The current method of family planning is status post hysterectomy.    Exercising: No.   Smoker:  no  Health Maintenance: Pap:  10/11/16 Neg    10/02/15 Neg History of abnormal Pap:  yes MMG:  10/12/18 Diagnostic bilateral BIRADS2:benign. (done at [facility]) Colonoscopy:  11/21/18 f/u 7 years.  Dr. Cristina Gong.  Colonoscopy was negative.   BMD:   09/17/16 osteopenia  TDaP:  Unsure  Pneumonia vaccine(s):  No Shingrix:   Not interested in doing this right now Hep C testing: done   Screening Labs: PCP   reports that she has never smoked. She has never used smokeless tobacco. She reports that she does not drink alcohol or use drugs.  Past Medical History:  Diagnosis Date  . Cancer of central portion of right female breast ([place]) 01/08/2016   stage 1A  . Complication of anesthesia    vertigo  . Congenital absence of femur    right  . Endometrioid adenocarcinoma    Stage IA, grade 2  . GERD (gastroesophageal reflux disease)   . History of external beam radiation therapy 10/27/16-12/24/16   right breast 50.4 Gy in 28 fracitons, boost 10 Gy in 5 fractions  . PONV (postoperative nausea and vomiting)   . 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 Spinal stenosis   . Vertigo    associated with anesthesia  . Vitamin D deficiency 09/2012    Past Surgical History:  Procedure Laterality Date  . ABDOMINAL HYSTERECTOMY  10/08/2010   Robotic, BSO, bilateral pelvic/ R periaortic LND  . CESAREAN SECTION    . CHOLECYSTECTOMY N/A 10/11/2013   Procedure: LAPAROSCOPIC CHOLECYSTECTOMY WITH INTRAOPERATIVE CHOLANGIOGRAM;  Surgeon: Joyice Faster. Cornett, MD;  Location: [place];  Service: General;  Laterality: N/A;  . ERCP N/A 10/12/2013   Procedure: ENDOSCOPIC RETROGRADE CHOLANGIOPANCREATOGRAPHY (ERCP);  Surgeon: Missy Sabins, MD;  Location: [facility] ENDOSCOPY;  Service: Endoscopy;  Laterality: N/A;  . PARTIAL MASTECTOMY WITH AXILLARY SENTINEL LYMPH NODE BIOPSY Right 03/09/16   at [facility]  . REPLACEMENT TOTAL KNEE     Left    Current Outpatient Medications  Medication Sig Dispense Refill  . Acetaminophen 500 MG coapsule Take by mouth.    . beta carotene w/minerals (OCUVITE) tablet Take 1 tablet by mouth daily.    . diclofenac sodium (VOLTAREN) 1 % GEL as needed.    . loteprednol (LOTEMAX) 0.5 % ophthalmic suspension     . meclizine (ANTIVERT) 50 MG tablet Take 1 tablet (50 mg  total) by mouth 3 (three) times daily as needed. 30 tablet 2  . omeprazole (PRILOSEC) 10 MG capsule Take 10 mg by mouth daily.     No current facility-administered medications for this visit.     Family History  Problem Relation Age of Onset  . Colon cancer Mother   . Colon cancer Father   . Asthma Paternal Grandmother   . Breast cancer Maternal Grandmother   . Breast cancer Maternal Aunt     Review of Systems  HENT: Positive for congestion.   All other systems reviewed and are negative.   Exam:   BP (!) 150/98 (BP Location: Left Arm, Patient Position: Sitting, Cuff Size: Large)   Pulse 76   Resp 16   Wt 195 lb (88.5 kg)   LMP 09/27/2010   BMI 33.47 kg/m   Height:      Ht Readings from Last 3  Encounters:  11/23/17 5\' 4"  (1.626 m)  02/17/17 5\' 2"  (1.575 m)  12/23/16 5\' 2"  (1.575 m)    General appearance: alert, cooperative and appears stated age Head: Normocephalic, without obvious abnormality, atraumatic Neck: no adenopathy, supple, symmetrical, trachea midline and thyroid normal to inspection and palpation Lungs: clear to auscultation bilaterally Breasts: right breast with well healed incisions and minimal radiation changes, no masses or LAD.  Left breast without masses, skin changes, LAD, nipple discharge Heart: regular rate and rhythm Abdomen: soft, non-tender; bowel sounds normal; no masses,  no organomegaly Extremities: extremities normal, atraumatic, no cyanosis or edema Skin: Skin color, texture, turgor normal. No rashes or lesions Lymph nodes: Cervical, supraclavicular, and axillary nodes normal. No abnormal inguinal nodes palpated Neurologic: Grossly normal  Pelvic: External genitalia:  no lesions              Urethra:  normal appearing urethra with no masses, tenderness or lesions              Bartholins and Skenes: normal                 Vagina: normal appearing vagina with normal color and discharge, no lesions              Cervix: absent              Pap taken: Yes.   Bimanual Exam:  Uterus:  uterus absent              Adnexa: no mass, fullness, tenderness               Rectovaginal: Confirms               Anus:  normal sphincter tone, no lesions  Chaperone was present for exam.  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  Genetic testing negative Sinusitis  P:   Mammogram guidelines reviewed.  Doing yearly at [facility] pap smear obtained  today per pt request CBC, CMP, lipids, TSH, Vit D obtained today Vaccines records from Dr. Harrington Challenger signed today Colonoscopy release signed today Order for Prevnar 13 given.  Will have her received pneumovax next year Called PCPs office today for work-in appt due to hypertension return annually or prn

## 2018-11-28 NOTE — Telephone Encounter (Signed)
Patient says she goes between [facility] and seeing Dr Sabra Heck. She did not schedule exam for next year because she want to check with Dr Sabra Heck if she want her to come back in six months.

## 2018-11-29 LAB — CBC
HEMOGLOBIN: 13.1 g/dL (ref 11.1–15.9)
Hematocrit: 40 % (ref 34.0–46.6)
MCH: 29.9 pg (ref 26.6–33.0)
MCHC: 32.8 g/dL (ref 31.5–35.7)
MCV: 91 fL (ref 79–97)
Platelets: 298 10*3/uL (ref 150–450)
RBC: 4.38 x10E6/uL (ref 3.77–5.28)
RDW: 13.4 % (ref 11.7–15.4)
WBC: 8.5 10*3/uL (ref 3.4–10.8)

## 2018-11-29 LAB — COMPREHENSIVE METABOLIC PANEL
ALBUMIN: 4.5 g/dL (ref 3.8–4.8)
ALT: 26 IU/L (ref 0–32)
AST: 29 IU/L (ref 0–40)
Albumin/Globulin Ratio: 1.7 (ref 1.2–2.2)
Alkaline Phosphatase: 118 IU/L — ABNORMAL HIGH (ref 39–117)
BILIRUBIN TOTAL: 0.4 mg/dL (ref 0.0–1.2)
BUN / CREAT RATIO: 19 (ref 12–28)
BUN: 19 mg/dL (ref 8–27)
CO2: 22 mmol/L (ref 20–29)
CREATININE: 1.01 mg/dL — AB (ref 0.57–1.00)
Calcium: 9.2 mg/dL (ref 8.7–10.3)
Chloride: 104 mmol/L (ref 96–106)
GFR calc non Af Amer: 59 mL/min/{1.73_m2} — ABNORMAL LOW (ref >59)
GFR, EST AFRICAN AMERICAN: 68 mL/min/{1.73_m2} (ref >59)
GLOBULIN, TOTAL: 2.7 g/dL (ref 1.5–4.5)
GLUCOSE: 82 mg/dL (ref 65–99)
Potassium: 4.8 mmol/L (ref 3.5–5.2)
SODIUM: 143 mmol/L (ref 134–144)
TOTAL PROTEIN: 7.2 g/dL (ref 6.0–8.5)

## 2018-11-29 LAB — LIPID PANEL
Chol/HDL Ratio: 3.2 ratio (ref 0.0–4.4)
Cholesterol, Total: 161 mg/dL (ref 100–199)
HDL: 50 mg/dL (ref >39)
LDL CALC: 87 mg/dL (ref 0–99)
Triglycerides: 122 mg/dL (ref 0–149)
VLDL CHOLESTEROL CAL: 24 mg/dL (ref 5–40)

## 2018-11-29 LAB — TSH: TSH: 2.37 u[IU]/mL (ref 0.450–4.500)

## 2018-11-29 LAB — VITAMIN D 25 HYDROXY (VIT D DEFICIENCY, FRACTURES): Vit D, 25-Hydroxy: 11.6 ng/mL — ABNORMAL LOW (ref 30.0–100.0)

## 2018-11-30 LAB — CYTOLOGY - PAP: DIAGNOSIS: NEGATIVE

## 2018-11-30 NOTE — Addendum Note (Signed)
Addended by: Megan Salon on: 11/30/2018 05:27 PM   Modules accepted: Orders

## 2018-12-01 ENCOUNTER — Telehealth: Payer: Self-pay | Admitting: *Deleted

## 2018-12-01 MED ORDER — VITAMIN D (ERGOCALCIFEROL) 1.25 MG (50000 UNIT) PO CAPS: 50000.0000 [IU] | ORAL_CAPSULE | ORAL | 0 refills | Status: DC

## 2018-12-01 NOTE — Telephone Encounter (Signed)
LM for pt to call back. AEX scheduled 11/29/19. Vit D rx sent to pharmacy.   Needs lab appt in 3 months.

## 2018-12-01 NOTE — Telephone Encounter (Signed)
Patient is returning call to [place]. °

## 2018-12-01 NOTE — Telephone Encounter (Signed)
-----  Message from Megan Salon, MD sent at 11/30/2018  5:25 PM EST ----- Please let pt know her pap was normal.  02 recall.  Her CBC, Lipid panel and thyroid tests were normal.  Her Vit D was very low at 11.  She needs to be on vit d 50k and repeat this in 3 months.    Her metabolic panel showed her kidney function to be 0.1 points above normal.  I will recheck this in 3 months.  She needs to make sure getting good water intake and decrease any sodas she drinks.    Lastly, her alk phos was mildly elevated.  This was elevated much more significantly in the past.  I want to repeat this as well in 3 months with the other blood work.  Orders have been placed.  Thanks.

## 2018-12-04 NOTE — Telephone Encounter (Signed)
Pt notified. Verbalized understanding. Lab appt scheduled 03/05/19

## 2019-02-22 ENCOUNTER — Telehealth: Payer: Self-pay | Admitting: Obstetrics & Gynecology

## 2019-02-22 DIAGNOSIS — R42 Dizziness and giddiness: Secondary | ICD-10-CM

## 2019-02-22 NOTE — Telephone Encounter (Signed)
Spoke with patient.  Hx of vertigo, has been increasing recently. Denies any other symptoms. Has been to ENT, neurosurgeon and PT in the past. Patient states she discussed ENT providers with Dr. Sabra Heck at 11/28/18 AEX, can not recall the names provided, requesting referral. Patient also reports she has completed her RX vit D, will have repeat labs on 6/8. Advised I will review with Dr. Sabra Heck and return call with recommendations. Patient agreeable.   Dr. Sabra Heck -please advise on ENT referral.

## 2019-02-22 NOTE — Telephone Encounter (Signed)
Patient would like a recommendation for a provider to see regarding vertigo she has been experiencing. States she has discussed with Dr. Sabra Heck at past appointments.

## 2019-02-23 NOTE — Telephone Encounter (Signed)
Patient calling to check status of message regarding referral.

## 2019-02-23 NOTE — Telephone Encounter (Signed)
Order has been placed to Dr. Melida Quitter at [facility] ENT.

## 2019-02-28 ENCOUNTER — Telehealth: Payer: Self-pay | Admitting: Obstetrics & Gynecology

## 2019-02-28 NOTE — Telephone Encounter (Signed)
Call placed to patient in reference to her referral. Patient is aware the referral has been faxed and Dr. Redmond Baseman office will call her for scheduling.

## 2019-02-28 NOTE — Telephone Encounter (Signed)
Patient has an appointment for lab work 03/05/19 at 10:00am. She is asking for a short consult appointment before or after her labs. To triage to assist with scheduling with Dr.Miller.

## 2019-02-28 NOTE — Telephone Encounter (Signed)
Is there any way she would consider a web ex visit first so that she doesn't have to come in the office?

## 2019-02-28 NOTE — Telephone Encounter (Signed)
Dr. Sabra Heck -Patient is returning for 3 month repeat hepatic panel, creatinine and Vit D. Should she wait on these labs?

## 2019-02-28 NOTE — Telephone Encounter (Signed)
Spoke with patient.  1. Patient requesting to review lab results dated 11/28/18, states she was only told about her Vit D, no other results. Reviewed 11/28/18 lab results and recommendations with patient.  Patient request OV instead of lab appt on 03/05/19. Patient states she would like to discuss "several things, such as low energy, nothing urgent." Patient does not wish to provide additional details.   2. Patient requesting f/u on ENT referral. Advised patient referral was placed to ENT, Dr. Melida Quitter on 02/23/19. Advised our referral coordinator will f/u with appt details once appt is scheduled. Will forward message to referral coordinator for f/u. Patient verbalizes understanding.   Lab appt cancelled for 6/8, OV scheduled for 6/8 at 4pm with Dr. Sabra Heck.   Routing to [employer] .   Cc: Dr. Sabra Heck

## 2019-03-01 ENCOUNTER — Other Ambulatory Visit: Payer: Self-pay

## 2019-03-01 NOTE — Telephone Encounter (Signed)
Thanks for reminding me about the blood work.  Since she's coming for that, I will just see her.  Thanks.  Encounter closed.

## 2019-03-05 ENCOUNTER — Encounter: Payer: Self-pay | Admitting: Obstetrics & Gynecology

## 2019-03-05 ENCOUNTER — Ambulatory Visit: Payer: Medicare Other | Admitting: Obstetrics & Gynecology

## 2019-03-05 ENCOUNTER — Other Ambulatory Visit: Payer: Medicare Other

## 2019-03-05 ENCOUNTER — Other Ambulatory Visit: Payer: Self-pay

## 2019-03-05 DIAGNOSIS — E559 Vitamin D deficiency, unspecified: Secondary | ICD-10-CM

## 2019-03-05 DIAGNOSIS — R7989 Other specified abnormal findings of blood chemistry: Secondary | ICD-10-CM | POA: Diagnosis not present

## 2019-03-05 DIAGNOSIS — R748 Abnormal levels of other serum enzymes: Secondary | ICD-10-CM

## 2019-03-05 MED ORDER — ALPRAZOLAM 0.5 MG PO TABS: ORAL_TABLET | ORAL | 0 refills | Status: DC

## 2019-03-05 NOTE — Progress Notes (Signed)
GYNECOLOGY  VISIT  CC:  Fatigue  HPI: 63 y.o. G1P1 Married White or Caucasian female here for complaint of fatigue and vertigo.  Webex visit was offered to patient but she desired to be seen.  Has hx of vertigo and has seen ENT, Dr. Lucia Gaskins, but feels he has basically told her to live with it.  Feels there emaybe additional therapies, treatments that have not been tried and would like to investigate these.  We have already referred her to ENT and she does have appt scheduled with Dr. Redmond Baseman on 03/15/2019.  Last year, she did see a physical therapist for the Epley Maneuver x 2.  Felt this helped.  She is taking meclizine every day and carries it in her pocket/purse at all times.  In the last two years, has also seen Dr. Ellene Route for a MRI of cervical spine that showed spurs.  She had a CT of the head several years ago.    Also has some questions about her Vit D level and her energy level.  Does feel better with the Vit D she is taking.  Does need lab work done today as well to repeat creatinine level and also have liver panel done.    Is having some issues with sleeping and anxiety.  Will get to sleep and has trouble staying asleep.  Has used some xanax in the past.  Would like Rx.   GYNECOLOGIC HISTORY: Patient's last menstrual period was 09/27/2010. Contraception: Hysterectomy  Menopausal hormone therapy: none   Patient Active Problem List   Diagnosis Date Noted  . Benign paroxysmal positional vertigo 04/20/2018  . Genetic testing 04/14/2016  . Malignant neoplasm of central portion of right female breast ([place]) 01/08/2016  . Cholelithiasis 10/13/2013  . Choledocholithiasis 10/13/2013  . Acute biliary pancreatitis 10/09/2013  . Endometrial adenocarcinoma ([place]) 03/16/2012    Past Medical History:  Diagnosis Date  . Cancer of central portion of right female breast ([place]) 01/08/2016   stage 1A  . Complication of anesthesia    vertigo  . Congenital absence of femur    right  . Endometrioid  adenocarcinoma    Stage IA, grade 2  . GERD (gastroesophageal reflux disease)   . History of external beam radiation therapy 10/27/16-12/24/16   right breast 50.4 Gy in 28 fracitons, boost 10 Gy in 5 fractions  . PONV (postoperative nausea and vomiting)   . 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 Spinal stenosis   . Vertigo    associated with anesthesia  . Vitamin D deficiency 09/2012    Past Surgical History:  Procedure Laterality Date  . CESAREAN SECTION    . CHOLECYSTECTOMY N/A 10/11/2013   Procedure: LAPAROSCOPIC CHOLECYSTECTOMY WITH INTRAOPERATIVE CHOLANGIOGRAM;  Surgeon: Joyice Faster. Cornett, MD;  Location: [facility];  Service: General;  Laterality: N/A;  . ERCP N/A 10/12/2013   Procedure: ENDOSCOPIC RETROGRADE CHOLANGIOPANCREATOGRAPHY (ERCP);  Surgeon: Missy Sabins, MD;  Location: [facility] ENDOSCOPY;  Service: Endoscopy;  Laterality: N/A;  . PARTIAL MASTECTOMY WITH AXILLARY SENTINEL LYMPH NODE BIOPSY Right 03/09/16   at [facility]  . REPLACEMENT TOTAL KNEE Left 2010   Dr. Maureen Ralphs  . ROBOTIC ASSISTED TOTAL HYSTERECTOMY WITH BILATERAL SALPINGO OOPHERECTOMY  10/08/2010   Robotic, BSO, bilateral pelvic/ R periaortic LND    MEDS:   Current Outpatient Medications on File Prior to Visit  Medication Sig Dispense Refill  . Acetaminophen 500 MG coapsule Take by mouth.    . diclofenac sodium (  VOLTAREN) 1 % GEL as needed.    . meclizine (ANTIVERT) 50 MG tablet Take 1 tablet (50 mg total) by mouth 3 (three) times daily as needed. 30 tablet 2  . omeprazole (PRILOSEC) 10 MG capsule Take 10 mg by mouth daily.    . RESTASIS MULTIDOSE 0.05 % ophthalmic emulsion      No current facility-administered medications on file prior to visit.     ALLERGIES: Codeine; Hydromorphone; Morphine; and Penicillins  Family History  Problem Relation Age of Onset  . Colon cancer Mother   . Colon cancer Father   . Asthma Paternal Grandmother   . Breast cancer Maternal Grandmother   .  Breast cancer Maternal Aunt     SH:  Married, non smoker  Review of Systems  Constitutional: Positive for fatigue.  Neurological: Positive for dizziness.  Psychiatric/Behavioral: Positive for sleep disturbance. The patient is nervous/anxious.   All other systems reviewed and are negative.   PHYSICAL EXAMINATION:    BP (!) 140/92   Pulse 92   Temp (!) 97.4 F (36.3 C) (Temporal)   Ht 5\' 4"  (1.626 m)   Wt 193 lb (87.5 kg)   LMP 09/27/2010   BMI 33.13 kg/m     General appearance: alert, cooperative and appears stated age No exam performed today  Assessment: H/O vertigo Vit D deficiency Anxiety with some insomnia  Plan: Vit D, Hepatic panel and creatinine obtained today Already has appt with Dr. Redmond Baseman for next week Rx for xanax 0.5mg  1/2 to 1 tab prn anxiety/insomnia   ~20 minutes spent with patient >50% of time was in face to face discussion of above.

## 2019-03-06 LAB — HEPATIC FUNCTION PANEL
ALT: 30 IU/L (ref 0–32)
AST: 32 IU/L (ref 0–40)
Albumin: 4.3 g/dL (ref 3.8–4.8)
Alkaline Phosphatase: 96 IU/L (ref 39–117)
Bilirubin Total: 0.3 mg/dL (ref 0.0–1.2)
Bilirubin, Direct: 0.11 mg/dL (ref 0.00–0.40)
Total Protein: 6.9 g/dL (ref 6.0–8.5)

## 2019-03-06 LAB — VITAMIN D 25 HYDROXY (VIT D DEFICIENCY, FRACTURES): Vit D, 25-Hydroxy: 41.2 ng/mL (ref 30.0–100.0)

## 2019-03-06 LAB — CREATININE, SERUM
Creatinine, Ser: 0.96 mg/dL (ref 0.57–1.00)
GFR calc Af Amer: 72 mL/min/{1.73_m2} (ref >59)
GFR calc non Af Amer: 62 mL/min/{1.73_m2} (ref >59)

## 2019-07-06 ENCOUNTER — Other Ambulatory Visit: Payer: Self-pay

## 2019-07-06 ENCOUNTER — Ambulatory Visit: Payer: Medicare Other | Attending: Otolaryngology | Admitting: Physical Therapy

## 2019-07-06 ENCOUNTER — Encounter: Payer: Self-pay | Admitting: Physical Therapy

## 2019-07-06 DIAGNOSIS — R2681 Unsteadiness on feet: Secondary | ICD-10-CM | POA: Diagnosis present

## 2019-07-06 DIAGNOSIS — R42 Dizziness and giddiness: Secondary | ICD-10-CM | POA: Insufficient documentation

## 2019-07-06 DIAGNOSIS — R262 Difficulty in walking, not elsewhere classified: Secondary | ICD-10-CM | POA: Diagnosis present

## 2019-07-06 DIAGNOSIS — M542 Cervicalgia: Secondary | ICD-10-CM | POA: Insufficient documentation

## 2019-07-07 NOTE — Therapy (Signed)
[facility] [address] [place], [place], [address] Phone: [phone]   Fax:  [phone]  Physical Therapy Evaluation  Patient Details  Name: Raven Marks MRN: [id number] Date of Birth: 26-Jun-1956 Referring Provider (PT): Melida Quitter, MD   Encounter Date: 07/06/2019  PT End of Session - 07/07/19 1030    Visit Number  1    Number of Visits  9    Date for PT Re-Evaluation  09/05/19    Authorization Type  [employer] Medicare; $20 copay.  NO VL.  10th visit PN    PT Start Time  1022    PT Stop Time  1111    PT Time Calculation (min)  49 min    Activity Tolerance  Patient tolerated treatment well    Behavior During Therapy  WFL for tasks assessed/performed       Past Medical History:  Diagnosis Date  . Cancer of central portion of right female breast ([place]) 01/08/2016   stage 1A  . Complication of anesthesia    vertigo  . Congenital absence of femur    right  . Endometrioid adenocarcinoma    Stage IA, grade 2  . GERD (gastroesophageal reflux disease)   . History of external beam radiation therapy 10/27/16-12/24/16   right breast 50.4 Gy in 28 fracitons, boost 10 Gy in 5 fractions  . PONV (postoperative nausea and vomiting)   . 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 Spinal stenosis   . Vertigo    associated with anesthesia  . Vitamin D deficiency 09/2012    Past Surgical History:  Procedure Laterality Date  . CESAREAN SECTION    . CHOLECYSTECTOMY N/A 10/11/2013   Procedure: LAPAROSCOPIC CHOLECYSTECTOMY WITH INTRAOPERATIVE CHOLANGIOGRAM;  Surgeon: Joyice Faster. Cornett, MD;  Location: [facility];  Service: General;  Laterality: N/A;  . ERCP N/A 10/12/2013   Procedure: ENDOSCOPIC RETROGRADE CHOLANGIOPANCREATOGRAPHY (ERCP);  Surgeon: Missy Sabins, MD;  Location: [facility] ENDOSCOPY;  Service: Endoscopy;  Laterality: N/A;  . PARTIAL MASTECTOMY WITH AXILLARY SENTINEL LYMPH NODE BIOPSY Right 03/09/16   at [facility]  . REPLACEMENT TOTAL KNEE Left 2010   Dr. Maureen Ralphs  . ROBOTIC ASSISTED TOTAL HYSTERECTOMY WITH BILATERAL SALPINGO OOPHERECTOMY  10/08/2010   Robotic, BSO, bilateral pelvic/ R periaortic LND    There were no vitals filed for this visit.   Subjective Assessment - 07/06/19 1026    Subjective  Symptoms started 10 years ago - spinning sensation, vision will double for 25-30 seconds and pt has to focus on an object to settle the dizziness.  Has seen multiple physicians - ENT and neurology.  Pt also has neck pain pain and migraines with aura.  Episodes are becoming more frequent and lasting longer.  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    Patient is accompained by:  Family member    Pertinent History  vitamin D deficiency, spinal stenosis, RLE prosthesis due to birth defect - congenital absence of R femur, R breast CA treated with chemo and radiation, endometrioid adenocarcinoma, Left TKA and neck pain    Diagnostic tests  MRI: Moderate multilevel cervical disc and facet degeneration. Nosignificant spinal stenosis.2. Mild-to-moderate multilevel neural foraminal stenosis as above    Patient Stated Goals  go to the park and walk with husband, [name]    Currently in Pain?  Yes  neck pain   Pain Location  Neck         [facility] PT Assessment - 07/06/19 1035      Assessment   Medical Diagnosis  Dizziness    Referring Provider (PT)  Melida Quitter, MD    Onset Date/Surgical Date  06/26/19    Prior Therapy  yes, multiple times.  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   Yes      Prior Function   Level of Independence  Independent with household mobility with device;Independent with community mobility with device;Independent with homemaking with ambulation;Independent with gait;Independent with transfers;Requires assistive device for independence    Vocation  Retired    Equities trader    Leisure  would like to walk in the park with her husband      Observation/Other Assessments   Focus on Therapeutic Outcomes (FOTO)   N/A      Sensation   Light Touch  Impaired by gross assessment    Additional Comments  gets intermittent numbness and tingling if she sleeps in the wrong position or sits too long - feels sciatic radicular pain           Vestibular Assessment - 07/06/19 1040      Vestibular Assessment   General Observation  ambulates with R prosthesis and cane; reports she started using cane recently due to back pain      Symptom Behavior   Subjective history of current problem  denies hearing loss but does have tinnitus; reports double vision after feeling the spinning.  Continues to get visual auras, and flashes of floaters but no headaches now.  No sensitivity to light, sound, smell.      Type of Dizziness   Diplopia;Spinning;"Funny feeling in head"    Frequency of Dizziness  has some good days and bad days    Duration of Dizziness  spinning lasts less than 30 seconds; wooziness persists through the day    Symptom Nature  Motion provoked    Aggravating Factors  Turning body quickly;Turning head quickly;Comment;Lying supine;Supine to sit;Rolling to right;Rolling to left   scrolling through phone.  anxiety/stress   Relieving Factors  Slow movements;Comments;Closing eyes   pressure on back of neck   Progression of Symptoms  Worse    History of similar episodes  has been treated for R BPPV      Oculomotor Exam   Oculomotor Alignment  Normal     Ocular ROM  WFL    Spontaneous  Absent    Gaze-induced   Absent    Smooth Pursuits  Intact    Saccades  Intact    Comment  convergence intact      Oculomotor Exam-Fixation Suppressed    Left Head Impulse  negative    Right Head Impulse  positive      Vestibulo-Ocular Reflex   VOR to Slow Head Movement  Normal    VOR Cancellation  Normal      Visual Acuity  Static  8    Dynamic  7      Positional Testing   Dix-Hallpike  Dix-Hallpike Right;Dix-Hallpike Left    Sidelying Test  --    Horizontal Canal Testing  Horizontal Canal Right;Horizontal Canal Left      Dix-Hallpike Right   Dix-Hallpike Right Duration  0    Dix-Hallpike Right Symptoms  No nystagmus      Dix-Hallpike Left   Dix-Hallpike Left Duration  0    Dix-Hallpike Left Symptoms  No nystagmus      Horizontal Canal Right   Horizontal Canal Right Duration  0    Horizontal Canal Right Symptoms  Normal      Horizontal Canal Left   Horizontal Canal Left Duration  0    Horizontal Canal Left Symptoms  Normal          Objective measurements completed on examination: See above findings.              PT Education - 07/07/19 1029    Education Details  clinical findings, PT POC and goals    Person(s) Educated  Patient;Spouse    Methods  Explanation    Comprehension  Verbalized understanding;Returned demonstration       PT Short Term Goals - 07/07/19 1038      PT SHORT TERM GOAL #1   Title  Pt will demonstrate independence with initial HEP    Time  4    Period  Weeks    Status  New    Target Date  08/06/19      PT SHORT TERM GOAL #2   Title  Pt will participate in further assessment of cervical ROM/pain and will initiate cervical HEP to improve ROM and input to vestibular system    Time  4    Period  Weeks    Status  New    Target Date  08/06/19      PT SHORT TERM GOAL #3   Title  Pt will participate in further assessment of balance during functional gait with FGA    Time  4    Period   Weeks    Status  New    Target Date  08/06/19        PT Long Term Goals - 07/07/19 1039      PT LONG TERM GOAL #1   Title  Pt will demonstrate indpendence with final vestibular and balance HEP    Time  8    Period  Weeks    Status  New    Target Date  09/05/19      PT LONG TERM GOAL #2   Title  Pt will report returning to walking on trails and visiting state parks with husband    Time  8    Period  Weeks    Status  New    Target Date  09/05/19      PT LONG TERM GOAL #3   Title  Pt will demonstrate 4 point improvement in FGA    Baseline  TBA    Time  8    Period  Weeks    Status  New    Target Date  09/05/19      PT LONG TERM GOAL #4   Title  Pt will demonstrate 8-10 deg increase in cervical functional ROM for increased vestibular input    Baseline  TBD    Time  8    Period  Weeks    Status  New  Target Date  09/05/19      PT LONG TERM GOAL #5   Title  Pt will improve use of VOR as indicated by ability to perform x1 viewing horizontal and vertical x 2 minutes without UE support    Time  8    Period  Weeks    Status  New    Target Date  09/05/19             Plan - 07/07/19 1031    Clinical Impression Statement  Pt is a 63 year old female referred to Neuro OPPT for evaluation of persistent dizziness - following evaluation by ENT and neurology; pt has participated in physical therapy for dizziness in the past and was treated for R BPPV at ENT office.  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 The following deficits were noted during pt's exam: disequilibrium and R vestibular hypofunction, impaired use of VOR, impaired standing balance and gait, neck pain and decreased ROM placing pt at increased risk for falls. Pt would benefit from skilled PT to address these  impairments and functional limitations to maximize functional mobility independence and reduce falls risk.    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, R breast CA treated with chemo and radiation, endometrioid adenocarcinoma, Left TKA and neck pain    Examination-Activity Limitations  Bed Mobility;Bend;Locomotion Level;Reach Overhead;Stand    Examination-Participation Restrictions  Cleaning;Community Activity    Stability/Clinical Decision Making  Stable/Uncomplicated    Clinical Decision Making  Low    Rehab Potential  Good    PT Frequency  1x / week    PT Duration  8 weeks    PT Treatment/Interventions  ADLs/Self Care Home Management;Canalith Repostioning;Cryotherapy;Moist Heat;Traction;Gait training;Stair training;Functional mobility training;Therapeutic activities;Therapeutic exercise;Balance training;Neuromuscular re-education;Patient/family education;Manual techniques;Passive range of motion;Dry needling;Vestibular;Taping    PT Next Visit Plan  Assess cervical ROM/pain and address as indicated - provide cervical ROM HEP; Assess FGA; reset LTG baselines.  Initiate x1 viewing and balance training for hypofunction.    Consulted and Agree with Plan of Care  Patient;Family member/caregiver    Family Member Consulted  husband [name]       Patient will benefit from skilled therapeutic intervention in order to improve the following deficits and impairments:  Decreased balance, Dizziness, Pain, Decreased range of motion, Difficulty walking  Visit Diagnosis: Dizziness and giddiness  Unsteadiness on feet  Difficulty in walking, not elsewhere classified  Cervicalgia     Problem List Patient Active Problem List   Diagnosis Date Noted  . Benign paroxysmal positional  vertigo 04/20/2018  . Genetic testing 04/14/2016  . Malignant neoplasm of central portion of right female breast ([place]) 01/08/2016  . Cholelithiasis 10/13/2013  . Choledocholithiasis 10/13/2013  . Acute biliary pancreatitis 10/09/2013  . Endometrial adenocarcinoma ([place]) 03/16/2012   Rico Junker, PT, DPT 07/07/19    10:48 AM    [facility] [address] [place], [place], [address] Phone: [phone]   Fax:  [phone]  Name: KENDYLL KEILMAN MRN: [id number] Date of Birth: [birth year]

## 2019-07-18 ENCOUNTER — Encounter: Payer: Self-pay | Admitting: Physical Therapy

## 2019-07-18 ENCOUNTER — Other Ambulatory Visit: Payer: Self-pay

## 2019-07-18 ENCOUNTER — Ambulatory Visit: Payer: Medicare Other | Admitting: Physical Therapy

## 2019-07-18 DIAGNOSIS — R262 Difficulty in walking, not elsewhere classified: Secondary | ICD-10-CM

## 2019-07-18 DIAGNOSIS — R2681 Unsteadiness on feet: Secondary | ICD-10-CM

## 2019-07-18 DIAGNOSIS — R42 Dizziness and giddiness: Secondary | ICD-10-CM

## 2019-07-18 DIAGNOSIS — M542 Cervicalgia: Secondary | ICD-10-CM

## 2019-07-18 NOTE — Patient Instructions (Addendum)
Gaze Stabilization: Sitting    Keeping eyes on target on wall 3 feet away, and move head side to side for _30___ seconds.  Repeat 2-3 times a day. Copyright  VHI. All rights reserved.   Gaze Stabilization: Tip Card  1.Target must remain in focus, not blurry, and appear stationary while head is in motion. 2.Perform exercises with small head movements (45 to either side of midline). 3.Increase speed of head motion so long as target is in focus. 4.If you wear eyeglasses, be sure you can see target through lens (therapist will give specific instructions for bifocal / progressive lenses). 5.These exercises may provoke dizziness or nausea. Work through these symptoms. If too dizzy, slow head movement slightly. Rest between each exercise. 6.Exercises demand concentration; avoid distractions.     Visuo-Vestibular: Head / Eyes Moving in Same Direction    Holding a target, keep eyes on target and slowly move target, head, and eyes in SAME direction side to side for 30 seconds. Perform sitting. Repeat __2__ times per session. Do __2__ sessions per day.

## 2019-07-18 NOTE — Therapy (Signed)
[facility] [address] [place], [place], [address] Phone: [phone]   Fax:  [phone]  Physical Therapy Treatment  Patient Details  Name: Raven Marks MRN: [id number] Date of Birth: 1955/10/14 Referring Provider (PT): Melida Quitter, MD   Encounter Date: 07/18/2019  PT End of Session - 07/18/19 1930    Visit Number  2    Number of Visits  9    Date for PT Re-Evaluation  09/05/19    Authorization Type  [employer] Medicare; $20 copay.  NO VL.  10th visit PN    PT Start Time  1410    PT Stop Time  1450    PT Time Calculation (min)  40 min    Activity Tolerance  Patient tolerated treatment well    Behavior During Therapy  WFL for tasks assessed/performed       Past Medical History:  Diagnosis Date  . Cancer of central portion of right female breast ([place]) 01/08/2016   stage 1A  . Complication of anesthesia    vertigo  . Congenital absence of femur    right  . Endometrioid adenocarcinoma    Stage IA, grade 2  . GERD (gastroesophageal reflux disease)   . History of external beam radiation therapy 10/27/16-12/24/16   right breast 50.4 Gy in 28 fracitons, boost 10 Gy in 5 fractions  . PONV (postoperative nausea and vomiting)   . 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 Spinal stenosis   . Vertigo    associated with anesthesia  . Vitamin D deficiency 09/2012    Past Surgical History:  Procedure Laterality Date  . CESAREAN SECTION    . CHOLECYSTECTOMY N/A 10/11/2013   Procedure: LAPAROSCOPIC CHOLECYSTECTOMY WITH INTRAOPERATIVE CHOLANGIOGRAM;  Surgeon: Joyice Faster. Cornett, MD;  Location: [facility];  Service: General;  Laterality: N/A;  . ERCP N/A 10/12/2013   Procedure: ENDOSCOPIC RETROGRADE CHOLANGIOPANCREATOGRAPHY (ERCP);  Surgeon: Missy Sabins, MD;  Location: [facility] ENDOSCOPY;  Service: Endoscopy;  Laterality: N/A;  . PARTIAL MASTECTOMY WITH AXILLARY SENTINEL LYMPH NODE BIOPSY Right 03/09/16   at [facility]  . REPLACEMENT TOTAL KNEE Left 2010   Dr. Maureen Ralphs  . ROBOTIC ASSISTED TOTAL HYSTERECTOMY WITH BILATERAL SALPINGO OOPHERECTOMY  10/08/2010   Robotic, BSO, bilateral pelvic/ R periaortic LND    There were no vitals filed for this visit.  Subjective Assessment - 07/18/19 1412    Subjective  Looking at the TV and looking around quickly.  Also feels dizzy when tilting her head back to put drops in her eyes.    Patient is accompained by:  Family member    Pertinent History  vitamin D deficiency, spinal stenosis, RLE prosthesis due to birth defect - congenital absence of R femur, R breast CA treated with chemo and radiation, endometrioid adenocarcinoma, Left TKA and neck pain    Diagnostic tests  MRI: Moderate multilevel cervical disc and facet degeneration. Nosignificant spinal stenosis.2. Mild-to-moderate multilevel neural foraminal stenosis as above    Patient Stated Goals  go to the park and walk with husband, [name]    Currently in Pain?  No/denies         [facility] PT Assessment - 07/18/19 1413      Functional Gait  Assessment   Gait assessed   Yes    Gait Level Surface  Walks 20 ft in less than 7 sec but greater than 5.5 sec, uses assistive device, slower speed, mild gait deviations, or  deviates 6-10 in outside of the 12 in walkway width.    Change in Gait Speed  Able to change speed, demonstrates mild gait deviations, deviates 6-10 in outside of the 12 in walkway width, or no gait deviations, unable to achieve a major change in velocity, or uses a change in velocity, or uses an assistive device.    Gait with Horizontal Head Turns  Performs head turns smoothly with no change in gait. Deviates no more than 6 in outside 12 in walkway width    Gait with Vertical Head Turns  Performs task with slight change in gait velocity (eg, minor disruption to smooth gait path), deviates 6 - 10 in outside 12 in walkway width or uses assistive device    Gait and Pivot Turn  Pivot turns safely  within 3 sec and stops quickly with no loss of balance.    Step Over Obstacle  Is able to step over one shoe box (4.5 in total height) but must slow down and adjust steps to clear box safely. May require verbal cueing.    Gait with Narrow Base of Support  Ambulates less than 4 steps heel to toe or cannot perform without assistance.    Gait with Eyes Closed  Walks 20 ft, slow speed, abnormal gait pattern, evidence for imbalance, deviates 10-15 in outside 12 in walkway width. Requires more than 9 sec to ambulate 20 ft.    Ambulating Backwards  Walks 20 ft, slow speed, abnormal gait pattern, evidence for imbalance, deviates 10-15 in outside 12 in walkway width.    Steps  Two feet to a stair, must use rail.    Total Score  16    FGA comment:  16/30         Vestibular Assessment - 07/18/19 1432      Other Tests   Comments  Optokinetic testing: horizontal caused moderate dizziness, vertical - lightheadedness but not as severe as horizontal      Positional Sensitivities   Nose to Right Knee  Mild dizziness    Right Knee to Sitting  Mild dizziness    Nose to Left Knee  No dizziness    Left Knee to Sitting  No dizziness    Head Turning x 5  Mild dizziness    Head Nodding x 5  No dizziness    Pivot Right in Standing  No dizziness    Pivot Left in Standing  No dizziness                Vestibular Treatment/Exercise - 07/18/19 1928      Vestibular Treatment/Exercise   Vestibular Treatment Provided  Gaze;Habituation    Habituation Exercises  Seated Horizontal Head Turns;Seated Vertical Head Turns    Gaze Exercises  X1 Viewing Horizontal;X1 Viewing Vertical      Seated Horizontal Head Turns   Number of Reps   10    Symptom Description   progressed to 30 seconds; no symptoms after 10 reps VOR cancellation for increased visual flow.  MIld symptoms after 30 seconds      Seated Vertical Head Turns   Symptom Description   30 seconds but no symptoms reported      X1 Viewing Horizontal    Foot Position  seated    Reps  2    Comments  30 seconds; mild symptoms      X1 Viewing Vertical   Foot Position  seated    Reps  2    Comments  30 seconds >  1 minute.  No symptoms; did not add to HEP            PT Education - 07/18/19 1927    Education Details  results of FGA, MSQ and optokinetic testing.  initiated x1 viewing and VOR cancellation    Person(s) Educated  Patient    Methods  Explanation;Demonstration;Handout    Comprehension  Verbalized understanding;Returned demonstration       PT Short Term Goals - 07/18/19 1938      PT SHORT TERM GOAL #1   Title  Pt will demonstrate independence with initial HEP    Time  4    Period  Weeks    Status  New    Target Date  08/06/19      PT SHORT TERM GOAL #2   Title  Pt will participate in further assessment of cervical ROM/pain and will initiate cervical HEP to improve ROM and input to vestibular system    Time  4    Period  Weeks    Status  New    Target Date  08/06/19      PT SHORT TERM GOAL #3   Title  Pt will participate in further assessment of balance during functional gait with FGA    Time  4    Period  Weeks    Status  Achieved    Target Date  08/06/19        PT Long Term Goals - 07/18/19 1938      PT LONG TERM GOAL #1   Title  Pt will demonstrate indpendence with final vestibular and balance HEP  (LTG due by 09/05/2019)    Time  8    Period  Weeks    Status  New      PT LONG TERM GOAL #2   Title  Pt will report returning to walking on trails and visiting state parks with husband    Time  8    Period  Weeks    Status  New      PT LONG TERM GOAL #3   Title  Pt will demonstrate 4 point improvement in FGA    Baseline  16/30    Time  8    Period  Weeks    Status  Revised      PT LONG TERM GOAL #4   Title  Pt will demonstrate 8-10 deg increase in cervical functional ROM for increased vestibular input    Baseline  TBD    Time  8    Period  Weeks    Status  New      PT LONG TERM GOAL #5    Title  Pt will improve use of VOR as indicated by ability to perform x1 viewing horizontal and VOR cancellation for visual flow x 2 minutes without UE support    Time  8    Period  Weeks    Status  Revised            Plan - 07/18/19 1931    Clinical Impression Statement  Continued balance, falls risk and vestibular assessment.  Per FGA pt is at increased risk for falls but multiple tasks limited due to LE prosthesis and not by dizziness; gait speed was most affected by dizziness.  Performed MSQ with pt most sensitive to head turns and bending down to floor.  Pt did not demonstrate sensitivity to vertical head movements.  Pt also demonstrated symptoms with optokinetic testing -more sensitive to horizontal movement.  Initiated gaze adaptation training and habituation with horizontal head movements.  Pt tolerated well. Will continue to address and progress towards LTG.    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, R breast CA treated with chemo and radiation, endometrioid adenocarcinoma, Left TKA and neck pain    Examination-Activity Limitations  Bed Mobility;Bend;Locomotion Level;Reach Overhead;Stand    Examination-Participation Restrictions  Cleaning;Community Activity    Stability/Clinical Decision Making  Stable/Uncomplicated    Rehab Potential  Good    PT Frequency  1x / week    PT Duration  8 weeks    PT Treatment/Interventions  ADLs/Self Care Home Management;Canalith Repostioning;Cryotherapy;Moist Heat;Traction;Gait training;Stair training;Functional mobility training;Therapeutic activities;Therapeutic exercise;Balance training;Neuromuscular re-education;Patient/family education;Manual techniques;Passive range of motion;Dry needling;Vestibular;Taping    PT Next Visit  Plan  PATIENT HAS 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  Assess cervical ROM/pain and address as indicated - provide cervical ROM HEP; progress x1 viewing (horizontal only); progress VOR cancellation for visual flow.  Standing balance with head turns - EO, varied surfaces.    Consulted and Agree with Plan of Care  Patient;Family member/caregiver    Family Member Consulted  husband [name]       Patient will benefit from skilled therapeutic intervention in order to improve the following deficits and impairments:  Decreased balance, Dizziness, Pain, Decreased range of motion, Difficulty walking  Visit Diagnosis: Dizziness and giddiness  Unsteadiness on feet  Difficulty in walking, not elsewhere classified  Cervicalgia     Problem List Patient Active Problem List   Diagnosis Date Noted  . Benign paroxysmal positional vertigo 04/20/2018  . Genetic testing 04/14/2016  . Malignant neoplasm of central portion of right female breast ([place]) 01/08/2016  . Cholelithiasis 10/13/2013  . Choledocholithiasis 10/13/2013  . Acute biliary pancreatitis 10/09/2013  . Endometrial adenocarcinoma ([place]) 03/16/2012   Rico Junker, PT, DPT 07/18/19    7:41 PM    [facility] [address] [place], [place], [address] Phone: [phone]   Fax:  [phone]  Name: SAMARIAH SIMONES MRN: [id number] Date of Birth: 1955-11-19

## 2019-07-19 ENCOUNTER — Encounter: Payer: Self-pay | Admitting: Obstetrics & Gynecology

## 2019-07-20 ENCOUNTER — Ambulatory Visit: Payer: Medicare Other

## 2019-07-26 ENCOUNTER — Encounter: Payer: Self-pay | Admitting: Physical Therapy

## 2019-07-26 ENCOUNTER — Other Ambulatory Visit: Payer: Self-pay

## 2019-07-26 ENCOUNTER — Ambulatory Visit: Payer: Medicare Other | Admitting: Physical Therapy

## 2019-07-26 DIAGNOSIS — R42 Dizziness and giddiness: Secondary | ICD-10-CM | POA: Diagnosis not present

## 2019-07-26 DIAGNOSIS — M542 Cervicalgia: Secondary | ICD-10-CM

## 2019-07-26 DIAGNOSIS — R2681 Unsteadiness on feet: Secondary | ICD-10-CM

## 2019-07-26 DIAGNOSIS — R262 Difficulty in walking, not elsewhere classified: Secondary | ICD-10-CM

## 2019-07-26 NOTE — Therapy (Signed)
[facility] [address] [place], [place], [address] Phone: [phone]   Fax:  [phone]  Physical Therapy Treatment  Patient Details  Name: Raven Marks MRN: [id number] Date of Birth: [birth year] Referring Provider (PT): Melida Quitter, MD   Encounter Date: 07/26/2019  PT End of Session - 07/26/19 0923    Visit Number  3    Number of Visits  9    Date for PT Re-Evaluation  09/05/19    Authorization Type  [employer] Medicare; $20 copay.  NO VL.  10th visit PN    PT Start Time  0806    PT Stop Time  0850    PT Time Calculation (min)  44 min    Activity Tolerance  Patient tolerated treatment well    Behavior During Therapy  [facility] for tasks assessed/performed       Past Medical History:  Diagnosis Date  . Cancer of central portion of right female breast ([place]) 01/08/2016   stage 1A  . Complication of anesthesia    vertigo  . Congenital absence of femur    right  . Endometrioid adenocarcinoma    Stage IA, grade 2  . GERD (gastroesophageal reflux disease)   . History of external beam radiation therapy 10/27/16-12/24/16   right breast 50.4 Gy in 28 fracitons, boost 10 Gy in 5 fractions  . PONV (postoperative nausea and vomiting)   . 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 Spinal stenosis   . Vertigo    associated with anesthesia  . Vitamin D deficiency 09/2012    Past Surgical History:  Procedure Laterality Date  . CESAREAN SECTION    . CHOLECYSTECTOMY N/A 10/11/2013   Procedure: LAPAROSCOPIC CHOLECYSTECTOMY WITH INTRAOPERATIVE CHOLANGIOGRAM;  Surgeon: Joyice Faster. Cornett, MD;  Location: [facility];  Service: General;  Laterality: N/A;  . ERCP N/A 10/12/2013   Procedure: ENDOSCOPIC RETROGRADE CHOLANGIOPANCREATOGRAPHY (ERCP);  Surgeon: Missy Sabins, MD;  Location[facility] ENDOSCOPY;  Service: Endoscopy;  Laterality: N/A;  . PARTIAL MASTECTOMY WITH AXILLARY SENTINEL LYMPH NODE BIOPSY Right 03/09/16   at [facility]  . REPLACEMENT TOTAL KNEE Left 2010   Dr. Maureen Ralphs  . ROBOTIC ASSISTED TOTAL HYSTERECTOMY WITH BILATERAL SALPINGO OOPHERECTOMY  10/08/2010   Robotic, BSO, bilateral pelvic/ R periaortic LND    There were no vitals filed for this visit.  Subjective Assessment - 07/26/19 0809    Subjective  Has had good days and bad days.  On the good days she has been able to go walking at the park and go to soccer games.  On bad days - due to weather or after a couple of good days - she spends most of the day watching TV or just walking a little around the house.  No episodes of spinning.    Patient is accompained by:  Family member    Pertinent History  vitamin D deficiency, spinal stenosis, RLE prosthesis due to birth defect - congenital absence of R femur, R breast CA treated with chemo and radiation, endometrioid adenocarcinoma, Left TKA and neck pain    Diagnostic tests  MRI: Moderate multilevel cervical disc and facet degeneration. Nosignificant spinal stenosis.2. Mild-to-moderate multilevel neural foraminal stenosis as above    Patient Stated Goals  go to the park and walk with husband, [name]    Currently in Pain?  No/denies  [facility] PT Treatment/Exercise - 07/26/19 0917      Exercises   Exercises  Neck      Neck Exercises: Supine   Cervical Isometrics  Right lateral flexion;Right rotation;5 secs;5 reps    Neck Retraction  5 reps;5 secs    Neck Retraction Limitations  cues to maintain head in midline; pt overactive on L side      Manual Therapy   Manual Therapy  Joint mobilization;Manual Traction;Myofascial release    Manual therapy comments  Reports mild lightheadedness during mobilization and when first sitting upright but reported improvement in neck pain and demonstrated head in midline after manual therapy    Joint Mobilization  lateral glides on L side to open cervical spine on R side due to increased lateral flexion on R side  attempting to shift away from R side; Grade I-II     Myofascial Release  suboccipital release combined with manual traction x 2-3 minutes in supine    Manual Traction  combined with suboccipital release x 2-3 minutes in supine      Vestibular Treatment/Exercise - 07/26/19 0823      Vestibular Treatment/Exercise   Vestibular Treatment Provided  Gaze;Habituation      Seated Horizontal Head Turns   Number of Reps   2    Symptom Description   progressed from 30 seconds > 60 seconds in sitting and then in standing.  NO symptoms.  Removed from HEP      X1 Viewing Horizontal   Foot Position  seated    Reps  2    Comments  progressed from 30 seconds > 60 seconds, mild symptoms       Gaze Stabilization: Sitting    Keeping eyes on target on wall 3 feet away, and move head side to side for  60___ seconds.  Repeat 2-3 times a day - morning, noon, evening. Copyright  VHI. All rights reserved.   Gaze Stabilization: Tip Card  1.Target must remain in focus, not blurry, and appear stationary while head is in motion. 2.Perform exercises with small head movements (45 to either side of midline). 3.Increase speed of head motion so long as target is in focus. 4.If you wear eyeglasses, be sure you can see target through lens (therapist will give specific instructions for bifocal / progressive lenses). 5.These exercises may provoke dizziness or nausea. Work through these symptoms. If too dizzy, slow head movement slightly. Rest between each exercise. 6.Exercises demand concentration; avoid distractions.   Flexibility: Upper Trapezius Stretch    Gently grasp right side of head while reaching behind back with other hand. Tilt head away until a gentle stretch is felt. Hold __30__ seconds. Repeat __2__ times per set.  Do __2-3_ sessions per day.  http://orth.exer.us/341   Copyright  VHI. All rights reserved.         PT Education - 07/26/19 [phone]    Education Details  updated HEP, upper  trap stretch, importance of grading activity on "good days" to avoid boom-bust cycle and crashing on "bad days"; reviewed pacing of HEP    Person(s) Educated  Patient    Methods  Explanation;Demonstration;Handout    Comprehension  Verbalized understanding;Returned demonstration       PT Short Term Goals - 07/18/19 1938      PT SHORT TERM GOAL #1   Title  Pt will demonstrate independence with initial HEP    Time  4    Period  Weeks    Status  New  Target Date  08/06/19      PT SHORT TERM GOAL #2   Title  Pt will participate in further assessment of cervical ROM/pain and will initiate cervical HEP to improve ROM and input to vestibular system    Time  4    Period  Weeks    Status  New    Target Date  08/06/19      PT SHORT TERM GOAL #3   Title  Pt will participate in further assessment of balance during functional gait with FGA    Time  4    Period  Weeks    Status  Achieved    Target Date  08/06/19        PT Long Term Goals - 07/18/19 1938      PT LONG TERM GOAL #1   Title  Pt will demonstrate indpendence with final vestibular and balance HEP  (LTG due by 09/05/2019)    Time  8    Period  Weeks    Status  New      PT LONG TERM GOAL #2   Title  Pt will report returning to walking on trails and visiting state parks with husband    Time  8    Period  Weeks    Status  New      PT LONG TERM GOAL #3   Title  Pt will demonstrate 4 point improvement in FGA    Baseline  16/30    Time  8    Period  Weeks    Status  Revised      PT LONG TERM GOAL #4   Title  Pt will demonstrate 8-10 deg increase in cervical functional ROM for increased vestibular input    Baseline  TBD    Time  8    Period  Weeks    Status  New      PT LONG TERM GOAL #5   Title  Pt will improve use of VOR as indicated by ability to perform x1 viewing horizontal and VOR cancellation for visual flow x 2 minutes without UE support    Time  8    Period  Weeks    Status  Revised             Plan - 07/26/19 0924    Clinical Impression Statement  Continued to review and progress HEP; due to no symptoms when performing VOR cancellation removed from HEP and decided to focus on gaze stabilization.  Progressed x1 viewing to 1 minute.  Initiated assessment of cervical ROM, manual therapy and stretches to improve cervical ROM and alignment.  Pt tolerated well with only mild symptoms during manual therapy but improvement in symptoms at end of session.  Will continue to address and progress towards LTG.    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, R breast CA treated with chemo and radiation, endometrioid adenocarcinoma, Left TKA and neck pain    Examination-Activity Limitations  Bed Mobility;Bend;Locomotion Level;Reach Overhead;Stand    Examination-Participation Restrictions  Cleaning;Community Activity    Stability/Clinical Decision Making  Stable/Uncomplicated    Rehab Potential  Good    PT Frequency  1x / week    PT Duration  8 weeks    PT Treatment/Interventions  ADLs/Self Care Home Management;Canalith Repostioning;Cryotherapy;Moist Heat;Traction;Gait training;Stair training;Functional mobility training;Therapeutic activities;Therapeutic exercise;Balance training;Neuromuscular  re-education;Patient/family education;Manual techniques;Passive range of motion;Dry needling;Vestibular;Taping    PT Next Visit Plan  PATIENT HAS 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 Measure neck ROM!! Dry needling to upper trap; continue manual therapy to cervical spine - lateral glides; progress x1 to 2 minutes.  Standing balance with head turns - EO, varied surfaces.    Consulted and Agree with Plan of Care  Patient;Family member/caregiver    Family Member Consulted  husband [name]        Patient will benefit from skilled therapeutic intervention in order to improve the following deficits and impairments:  Decreased balance, Dizziness, Pain, Decreased range of motion, Difficulty walking  Visit Diagnosis: Dizziness and giddiness  Unsteadiness on feet  Difficulty in walking, not elsewhere classified  Cervicalgia     Problem List Patient Active Problem List   Diagnosis Date Noted  . Benign paroxysmal positional vertigo 04/20/2018  . Genetic testing 04/14/2016  . Malignant neoplasm of central portion of right female breast ([place]) 01/08/2016  . Cholelithiasis 10/13/2013  . Choledocholithiasis 10/13/2013  . Acute biliary pancreatitis 10/09/2013  . Endometrial adenocarcinoma ([place]) 03/16/2012    Rico Junker, PT, DPT 07/26/19    9:28 AM    [facility] [address] [place], [place], [address] Phone: [phone]   Fax:  [phone]  Name: Raven Marks MRN: [id number] Date of Birth: Mar 15, 1956

## 2019-07-26 NOTE — Patient Instructions (Addendum)
Gaze Stabilization: Sitting    Keeping eyes on target on wall 3 feet away, and move head side to side for  60___ seconds.  Repeat 2-3 times a day - morning, noon, evening. Copyright  VHI. All rights reserved.   Gaze Stabilization: Tip Card  1.Target must remain in focus, not blurry, and appear stationary while head is in motion. 2.Perform exercises with small head movements (45 to either side of midline). 3.Increase speed of head motion so long as target is in focus. 4.If you wear eyeglasses, be sure you can see target through lens (therapist will give specific instructions for bifocal / progressive lenses). 5.These exercises may provoke dizziness or nausea. Work through these symptoms. If too dizzy, slow head movement slightly. Rest between each exercise. 6.Exercises demand concentration; avoid distractions.   Flexibility: Upper Trapezius Stretch    Gently grasp right side of head while reaching behind back with other hand. Tilt head away until a gentle stretch is felt. Hold __30__ seconds. Repeat __2__ times per set.  Do __2-3_ sessions per day.  http://orth.exer.us/341   Copyright  VHI. All rights reserved.

## 2019-08-02 ENCOUNTER — Ambulatory Visit: Payer: Medicare Other | Attending: Otolaryngology | Admitting: Physical Therapy

## 2019-08-02 ENCOUNTER — Encounter: Payer: Self-pay | Admitting: Physical Therapy

## 2019-08-02 ENCOUNTER — Other Ambulatory Visit: Payer: Self-pay

## 2019-08-02 DIAGNOSIS — M542 Cervicalgia: Secondary | ICD-10-CM | POA: Insufficient documentation

## 2019-08-02 DIAGNOSIS — R42 Dizziness and giddiness: Secondary | ICD-10-CM | POA: Diagnosis present

## 2019-08-02 DIAGNOSIS — R2681 Unsteadiness on feet: Secondary | ICD-10-CM | POA: Diagnosis present

## 2019-08-02 DIAGNOSIS — R262 Difficulty in walking, not elsewhere classified: Secondary | ICD-10-CM | POA: Insufficient documentation

## 2019-08-02 NOTE — Patient Instructions (Signed)
Dr. 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  Do you have any concerns with [place] using dry needles made of surgical stainless steel or do you feel she would experience a similar reaction?  Please advise.    Thanks, Rico Junker, PT, DPT 08/02/19    10:00 AM

## 2019-08-02 NOTE — Therapy (Signed)
[facility] [address] [place], [place], [address] Phone: [phone]   Fax:  [phone]  Physical Therapy Treatment  Patient Details  Name: Raven Marks MRN: [id number] Date of Birth: 1956-03-04 Referring Provider (PT): Melida Quitter, MD   Encounter Date: 08/02/2019  PT End of Session - 08/02/19 1257    Visit Number  4    Number of Visits  9    Date for PT Re-Evaluation  09/05/19    Authorization Type  [employer] Medicare; $20 copay.  NO VL.  10th visit PN    PT Start Time  [phone]    PT Stop Time  1000    PT Time Calculation (min)  74 min    Activity Tolerance  Patient tolerated treatment well    Behavior During Therapy  WFL for tasks assessed/performed       Past Medical History:  Diagnosis Date  . Cancer of central portion of right female breast ([place]) 01/08/2016   stage 1A  . Complication of anesthesia    vertigo  . Congenital absence of femur    right  . Endometrioid adenocarcinoma    Stage IA, grade 2  . GERD (gastroesophageal reflux disease)   . History of external beam radiation therapy 10/27/16-12/24/16   right breast 50.4 Gy in 28 fracitons, boost 10 Gy in 5 fractions  . PONV (postoperative nausea and vomiting)   . 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 Spinal stenosis   . Vertigo    associated with anesthesia  . Vitamin D deficiency 09/2012    Past Surgical History:  Procedure Laterality Date  . CESAREAN SECTION    . CHOLECYSTECTOMY N/A 10/11/2013   Procedure: LAPAROSCOPIC CHOLECYSTECTOMY WITH INTRAOPERATIVE CHOLANGIOGRAM;  Surgeon: Joyice Faster. Cornett, MD;  Location: [facility];  Service: General;  Laterality: N/A;  . ERCP N/A 10/12/2013   Procedure: ENDOSCOPIC RETROGRADE CHOLANGIOPANCREATOGRAPHY (ERCP);  Surgeon: Missy Sabins, MD;  Location: [facility] ENDOSCOPY;  Service: Endoscopy;  Laterality: N/A;  . PARTIAL MASTECTOMY WITH AXILLARY SENTINEL LYMPH NODE BIOPSY Right 03/09/16    at [facility]  . REPLACEMENT TOTAL KNEE Left 2010   Dr. Maureen Ralphs  . ROBOTIC ASSISTED TOTAL HYSTERECTOMY WITH BILATERAL SALPINGO OOPHERECTOMY  10/08/2010   Robotic, BSO, bilateral pelvic/ R periaortic LND    There were no vitals filed for this visit.  Subjective Assessment - 08/02/19 0856    Subjective  Pt reports she is up to doing the letter exercise, 1 minute, 3x/day and is performing neck exercises.  Mainly feels dizziness with neck exercises.  Is down to sleeping with 2 pillows.  Is nervous about dry needling.  Having some R deltoid soreness from holding her husband's arm when walking; walking two times a day for 30 minutes.    Patient is accompained by:  Family member    Pertinent History  vitamin D deficiency, spinal stenosis, RLE prosthesis due to birth defect - congenital absence of R femur, R breast CA treated with chemo and radiation, endometrioid adenocarcinoma, Left TKA and neck pain    Diagnostic tests  MRI: Moderate multilevel cervical disc and facet degeneration. No significant spinal stenosis.2. Mild-to-moderate multilevel neural foraminal stenosis as above    Patient Stated Goals  go to the park and walk with husband, Raven Marks    Currently in Pain?  No/denies         [facility] PT Assessment - 08/02/19 0907      ROM /  Strength   AROM / PROM / Strength  AROM      AROM   Overall AROM   Deficits    AROM Assessment Site  Cervical    Cervical Flexion  35 with tightness, no pain or dizziness    Cervical Extension  40, no pain or dizziness    Cervical - Right Side Bend  25, no pain or dizziness    Cervical - Left Side Bend  20, tightness but no pain or dizziness.  Tends to compensate with L rotation    Cervical - Right Rotation  45, no pain or dizziness    Cervical - Left Rotation  40, tightness, no pain or dizziness                   [facility] Adult PT Treatment/Exercise - 08/02/19 1232      Therapeutic Activites    Therapeutic Activities  Other Therapeutic  Activities    Other Therapeutic Activities  educated on purpose and technique of TDN.  Also educated on common side effects, sympathetic response and that treatment would be ceased immediately if she wasn't able to tolerate.  Also discussed nickel allergy and pt did report that the buckle on her prosthesis causes a rash on her leg due to an allergy to nickel.  Dry needles are surgical stainless steel but can sometimes have a nickel coating on it.  Will request clearance from dermatologist before proceeding with dry needling in order to avoid an adverse reaction.  Pt agreeable.  Letter sent with pt to give to dermatologist for clearance or recommendations.  Discussed with pt how her leg length discrepancy can affect everything up the chain - pelvis > low back > mid back > neck and head position and how pt leans away from R side causing shortening on R side, elongation on L.  Discussed how a new heel wedge would improve positioning of pelvis and alignment of back > neck and head position which may assist in relieving some neck pain.  Trialed various height lifts with most appropriate heel wedge in L shoe.  Performed static standing and ambulation around gym with pt demonstrating decreased trendelenberg gait, decreased lateral lean and head in midline.  Pt reported feeling more balanced.  Also lowered cane one more setting to decrease L shoulder elevation. Advised pt to gradually increase wear of new heel wedge to avoid significant increase in soreness with new alignment  - advised pt to not wear when walking outside with husband right now - will progress to walking outside with heel wedge.        Exercises   Exercises  Neck    Other Exercises   Pt experiencing tenderness and pain in R deltoid mm; discussed with patient expected soreness after initiating a new exercise or activity and discussed/demonstrated ways to strengthen R middle, anterior and posterior deltoid with light hand weights or theraband.  Discussed  the concept of "safe but sore" when starting back to exercising.      Neck Exercises: Supine   Cervical Isometrics  Right lateral flexion;Left lateral flexion;Right rotation;Left rotation;5 secs;5 reps    Neck Retraction  5 reps;5 secs    Neck Retraction Limitations  in between isometric rotation to L and R      Manual Therapy   Manual Therapy  Joint mobilization;Myofascial release;Passive ROM;Manual Traction    Manual therapy comments  no lightheadedness today    Joint Mobilization  lateral glides to L and R; Grade II  Myofascial Release  suboccipital release combined with manual traction x 2-3 minutes in supine    Passive ROM  passive stretch of R upper trapezius muscles x 30 seconds x 2    Manual Traction  combined with suboccipital release x 2-3 minutes in supine      Neck Exercises: Stretches   Upper Trapezius Stretch  Right;Left;1 rep;30 seconds    Upper Trapezius Stretch Limitations  Pt not performing home stretch correctly; reviewed correct technique with one UE anchored and head leaning away from anchored side (pt leaning towards).  Reviewed on each side.             PT Education - 08/02/19 1257    Education Details  see TA; reviewed upper trap stretch    Person(s) Educated  Patient    Methods  Explanation;Demonstration    Comprehension  Verbalized understanding;Returned demonstration       PT Short Term Goals - 07/18/19 1938      PT SHORT TERM GOAL #1   Title  Pt will demonstrate independence with initial HEP    Time  4    Period  Weeks    Status  New    Target Date  08/06/19      PT SHORT TERM GOAL #2   Title  Pt will participate in further assessment of cervical ROM/pain and will initiate cervical HEP to improve ROM and input to vestibular system    Time  4    Period  Weeks    Status  New    Target Date  08/06/19      PT SHORT TERM GOAL #3   Title  Pt will participate in further assessment of balance during functional gait with FGA    Time  4     Period  Weeks    Status  Achieved    Target Date  08/06/19        PT Long Term Goals - 07/18/19 1938      PT LONG TERM GOAL #1   Title  Pt will demonstrate indpendence with final vestibular and balance HEP  (LTG due by 09/05/2019)    Time  8    Period  Weeks    Status  New      PT LONG TERM GOAL #2   Title  Pt will report returning to walking on trails and [place] with [place]    Time  8    Period  Weeks    Status  New      PT LONG TERM GOAL #3   Title  Pt will demonstrate 4 point improvement in FGA    Baseline  16/30    Time  8    Period  Weeks    Status  Revised      PT LONG TERM GOAL #4   Title  Pt will demonstrate 8-10 deg increase in cervical functional ROM for increased vestibular input    Baseline  TBD    Time  8    Period  Weeks    Status  New      PT LONG TERM GOAL #5   Title  Pt will improve use of VOR as indicated by ability to perform x1 viewing horizontal and VOR cancellation for visual flow x 2 minutes without UE support    Time  8    Period  Weeks    Status  Revised            Plan -  08/02/19 1258    Clinical Impression Statement  Performed assessment of cervical ROM limitations and planned to re-assess after TDN but unable to perform TDN today due to possible metal allergy; will seek clearance from dermatologist first before proceeding.  Continued to focus on manual therapy and cervical isometrics to continue to address cervical ROM, strength and increase space for neural mobility.  Continued to review appropriate way to perform cervical stretches at home and how to begin to strengthen shoulders.  Also addressed pt's leg length discrepency and cane height to improve overall alignment and posture to affect head and neck position.  Will assess tolerance to heel wedge next session.    Personal Factors and Comorbidities  Comorbidity 3+;Finances;Past/Current Experience;Time since onset of injury/illness/exacerbation;Fitness    Comorbidities   vitamin D deficiency, spinal stenosis, RLE prosthesis due to birth defect - congenital absence of R femur, R breast CA treated with chemo and radiation, endometrioid adenocarcinoma, Left TKA and neck pain    Examination-Activity Limitations  Bed Mobility;Bend;Locomotion Level;Reach Overhead;Stand    Examination-Participation Restrictions  Cleaning;Community Activity    Stability/Clinical Decision Making  Stable/Uncomplicated    Rehab Potential  Good    PT Frequency  1x / week    PT Duration  8 weeks    PT Treatment/Interventions  ADLs/Self Care Home Management;Canalith Repostioning;Cryotherapy;Moist Heat;Traction;Gait training;Stair training;Functional mobility training;Therapeutic activities;Therapeutic exercise;Balance training;Neuromuscular re-education;Patient/family education;Manual techniques;Passive range of motion;Dry needling;Vestibular;Taping    PT Next Visit Plan  PATIENT HAS 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 Check STG !  How has she tolerated heel wedge in L shoe?  have her continue to increase time she is wearing and may try short walk outside with husband with heel wege.  Add to HEP: cervical isometrics, upper back and shoulder strengthening, progress x1 viewing.  Standing balance with head turns - EO, varied surfaces.  If her dermatologist cleared [place] to perform TDN, I will do at next visit.    Consulted and Agree with Plan of Care  Patient;Family member/caregiver    Family Member Consulted  husband Raven Marks       Patient will benefit from skilled therapeutic intervention in order to improve the following deficits and impairments:  Decreased balance, Dizziness, Pain, Decreased range of motion, Difficulty walking  Visit Diagnosis: Dizziness and giddiness  Unsteadiness on feet  Difficulty in walking, not elsewhere classified  Cervicalgia     Problem List Patient Active Problem List   Diagnosis Date Noted  . Benign paroxysmal positional vertigo 04/20/2018   . Genetic testing 04/14/2016  . Malignant neoplasm of central portion of right female breast ([place]) 01/08/2016  . Cholelithiasis 10/13/2013  . Choledocholithiasis 10/13/2013  . Acute biliary pancreatitis 10/09/2013  . Endometrial adenocarcinoma ([place]) 03/16/2012    Rico Junker, PT, DPT 08/02/19    1:05 PM    [facility] [address] [place], [place], [address] Phone: [phone]   Fax:  [phone]  Name: Raven Marks MRN: [id number] Date of Birth: September 09, 1956

## 2019-08-08 ENCOUNTER — Ambulatory Visit: Payer: Medicare Other

## 2019-08-08 ENCOUNTER — Other Ambulatory Visit: Payer: Self-pay

## 2019-08-08 VITALS — BP 135/88 | HR 75

## 2019-08-08 DIAGNOSIS — R2681 Unsteadiness on feet: Secondary | ICD-10-CM

## 2019-08-08 DIAGNOSIS — R262 Difficulty in walking, not elsewhere classified: Secondary | ICD-10-CM

## 2019-08-08 DIAGNOSIS — R42 Dizziness and giddiness: Secondary | ICD-10-CM | POA: Diagnosis not present

## 2019-08-08 DIAGNOSIS — M542 Cervicalgia: Secondary | ICD-10-CM

## 2019-08-08 NOTE — Therapy (Signed)
[facility] [address] [place], [place], [address] Phone: [phone]   Fax:  [phone]  Physical Therapy Treatment  Patient Details  Name: Raven Marks MRN: [id number] Date of Birth: 1955/12/01 Referring Provider (PT): Melida Quitter, MD   Encounter Date: 08/08/2019  PT End of Session - 08/08/19 1025    Visit Number  5    Number of Visits  9    Date for PT Re-Evaluation  09/05/19    Authorization Type  [employer] Medicare; $20 copay.  NO VL.  10th visit PN    PT Start Time  0933    PT Stop Time  1015    PT Time Calculation (min)  42 min    Equipment Utilized During Treatment  --   min guard to S prn   Activity Tolerance  Patient tolerated treatment well;Treatment limited secondary to medical complications (Comment);Other (comment)   elevated diastolic BP   Behavior During Therapy  WFL for tasks assessed/performed       Past Medical History:  Diagnosis Date  . Cancer of central portion of right female breast ([place]) 01/08/2016   stage 1A  . Complication of anesthesia    vertigo  . Congenital absence of femur    right  . Endometrioid adenocarcinoma    Stage IA, grade 2  . GERD (gastroesophageal reflux disease)   . History of external beam radiation therapy 10/27/16-12/24/16   right breast 50.4 Gy in 28 fracitons, boost 10 Gy in 5 fractions  . PONV (postoperative nausea and vomiting)   . 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 Spinal stenosis   . Vertigo    associated with anesthesia  . Vitamin D deficiency 09/2012    Past Surgical History:  Procedure Laterality Date  . CESAREAN SECTION    . CHOLECYSTECTOMY N/A 10/11/2013   Procedure: LAPAROSCOPIC CHOLECYSTECTOMY WITH INTRAOPERATIVE CHOLANGIOGRAM;  Surgeon: Joyice Faster. Cornett, MD;  Location: [facility];  Service: General;  Laterality: N/A;  . ERCP N/A 10/12/2013   Procedure: ENDOSCOPIC RETROGRADE CHOLANGIOPANCREATOGRAPHY (ERCP);   Surgeon: Missy Sabins, MD;  Location: [facility] ENDOSCOPY;  Service: Endoscopy;  Laterality: N/A;  . PARTIAL MASTECTOMY WITH AXILLARY SENTINEL LYMPH NODE BIOPSY Right 03/09/16   at [facility]  . REPLACEMENT TOTAL KNEE Left 2010   Dr. Maureen Ralphs  . ROBOTIC ASSISTED TOTAL HYSTERECTOMY WITH BILATERAL SALPINGO OOPHERECTOMY  10/08/2010   Robotic, BSO, bilateral pelvic/ R periaortic LND    Vitals:   08/08/19 0950 08/08/19 0952 08/08/19 0959  BP: (!) 140/95 (!) 136/101 135/88  Pulse: 91 83 75  SpO2: 98%      Subjective Assessment - 08/08/19 0937    Subjective  Pt denied falls since last visit. Pt hasn't taken Meclizine in the last week. Pt has worn L heel wedge most days, longest wear period: 2 hours with some incr. in back pain but she has felt balance is better with wedge. Pt has extreme nickel allergy, pt reported she emailed primary PT Letta Moynahan) this information.    Pertinent History  vitamin D deficiency, spinal stenosis, RLE prosthesis due to birth defect - congenital absence of R femur, R breast CA treated with chemo and radiation, endometrioid adenocarcinoma, Left TKA and neck pain    Diagnostic tests  MRI: Moderate multilevel cervical disc and facet degeneration. No significant spinal stenosis.2. Mild-to-moderate multilevel neural foraminal stenosis as above    Currently in Pain?  No/denies  [facility] PT Assessment - 08/08/19 0940      Functional Gait  Assessment   Gait assessed   Yes    Gait Level Surface  Walks 20 ft in less than 7 sec but greater than 5.5 sec, uses assistive device, slower speed, mild gait deviations, or deviates 6-10 in outside of the 12 in walkway width.    Change in Gait Speed  Able to change speed, demonstrates mild gait deviations, deviates 6-10 in outside of the 12 in walkway width, or no gait deviations, unable to achieve a major change in velocity, or uses a change in velocity, or uses an assistive device.    Gait with Horizontal Head Turns  Performs head turns  smoothly with no change in gait. Deviates no more than 6 in outside 12 in walkway width    Gait with Vertical Head Turns  Performs task with slight change in gait velocity (eg, minor disruption to smooth gait path), deviates 6 - 10 in outside 12 in walkway width or uses assistive device    Gait and Pivot Turn  Pivot turns safely within 3 sec and stops quickly with no loss of balance.    Step Over Obstacle  Is able to step over one shoe box (4.5 in total height) but must slow down and adjust steps to clear box safely. May require verbal cueing.    Gait with Narrow Base of Support  Ambulates less than 4 steps heel to toe or cannot perform without assistance.    Gait with Eyes Closed  Walks 20 ft, slow speed, abnormal gait pattern, evidence for imbalance, deviates 10-15 in outside 12 in walkway width. Requires more than 9 sec to ambulate 20 ft.    Ambulating Backwards  Walks 20 ft, slow speed, abnormal gait pattern, evidence for imbalance, deviates 10-15 in outside 12 in walkway width.    Steps  Alternating feet, must use rail.    Total Score  17    FGA comment:  17/30: indicates high falls risk.        Neuro re-ed: Instructions Gaze Stabilization: Sitting    Keeping eyes on target on wall 20fet away, and move head side to side for _30___ seconds.Repeat 2-3 times a day. Copyright  VHI. All rights reserved.  Gaze Stabilization: Tip Card  1.Target must remain in focus, not blurry, and appear stationary while head is in motion. 2.Perform exercises with small head movements (45 to either side of midline). 3.Increase speed of head motion so long as target is in focus. 4.If you wear eyeglasses, be sure you can see target through lens (therapist will give specific instructions for bifocal / progressive lenses). 5.These exercises may provoke dizziness or nausea. Work through these symptoms. If too dizzy, slow head movement slightly. Rest between each exercise. 6.Exercises demand  concentration; avoid distractions.  Gaze Stabilization: Standing Feet Apart (Compliant Surface)    Feet apart on pillow, keeping eyes on target on wall __3__ feet away, tilt head down 15-30 and move head side to side for __60__ seconds.  Do __2__ sessions per day.   Copyright  VHI. All rights reserved.        Flexibility: Upper Trapezius Stretch (therex-1 minute of session)    Gently grasp right side of head while reaching behind back with other hand. Tilt head away until a gentle stretch is felt. Hold __30__ seconds. Repeat __2__ times per set. Do __2-3_ sessions per day.  http://orth.exer.us/341  Copyright  VHI. All rights reserved.  Vestibular Treatment/Exercise - 08/08/19 1020      Vestibular Treatment/Exercise   Vestibular Treatment Provided  Gaze    Gaze Exercises  X1 Viewing Horizontal      X1 Viewing Horizontal   Foot Position  seated and standing with feet apart    Time  --   60 sec   Reps  3   1 seated, 1 standing x20 sec., standing x60 sec.    Comments  --   1/10 dizziness, progressed to feet apart standing           Self Care: PT Education - 08/08/19 1022    Education Details  PT reviewed goal progress. Educated on how to cut heel wedge to fit shoe and for comfort. PT educated pt on slightly elevated BP during session and to assess at home-notify MD if it remains elevated to call 911 if having N/T, severe HA, confusion, weakness,etc. PT educated pt on FGA score. PT encouraged pt to continue to wear L heel wedge for no more than 2 hours at this time.    Person(s) Educated  Patient    Methods  Explanation;Handout;Demonstration;Verbal cues    Comprehension  Returned demonstration;Verbalized understanding       PT Short Term Goals - 08/08/19 0939      PT SHORT TERM GOAL #1   Title  Pt will demonstrate independence with initial HEP.    Time  4    Period  Weeks    Status  Achieved    Target Date  08/06/19      PT  SHORT TERM GOAL #2   Title  Pt will participate in further assessment of cervical ROM/pain and will initiate cervical HEP to improve ROM and input to vestibular system    Time  4    Period  Weeks    Status  Achieved    Target Date  08/06/19      PT SHORT TERM GOAL #3   Title  Pt will participate in further assessment of balance during functional gait with FGA    Baseline  17/30 on 08/08/19    Time  4    Period  Weeks    Status  Partially Met    Target Date  08/06/19        PT Long Term Goals - 07/18/19 1938      PT LONG TERM GOAL #1   Title  Pt will demonstrate indpendence with final vestibular and balance HEP  (LTG due by 09/05/2019)    Time  8    Period  Weeks    Status  New      PT LONG TERM GOAL #2   Title  Pt will report returning to walking on trails and visiting state parks with husband    Time  8    Period  Weeks    Status  New      PT LONG TERM GOAL #3   Title  Pt will demonstrate 4 point improvement in FGA    Baseline  16/30    Time  8    Period  Weeks    Status  Revised      PT LONG TERM GOAL #4   Title  Pt will demonstrate 8-10 deg increase in cervical functional ROM for increased vestibular input    Baseline  TBD    Time  8    Period  Weeks    Status  New      PT LONG TERM GOAL #  5   Title  Pt will improve use of VOR as indicated by ability to perform x1 viewing horizontal and VOR cancellation for visual flow x 2 minutes without UE support    Time  8    Period  Weeks    Status  Revised            Plan - 08/08/19 1026    Clinical Impression Statement  Pt demonstrated progress as she met STGs 1 and 2. Pt partially met STG 3 (FGA). Pt also tolerated progression of x1viewing HEP from seated to standing position. Pt did require seated rest break to allow diastolic BP to decr. after activity with SOB. PT encouraged pt f/u with MD if BP remains elevated-pt denied severe HA, weakness, N/T. Continue with POC.    Personal Factors and Comorbidities   Comorbidity 3+;Finances;Past/Current Experience;Time since onset of injury/illness/exacerbation;Fitness    Comorbidities  vitamin D deficiency, spinal stenosis, RLE prosthesis due to birth defect - congenital absence of R femur, R breast CA treated with chemo and radiation, endometrioid adenocarcinoma, Left TKA and neck pain    Examination-Activity Limitations  Bed Mobility;Bend;Locomotion Level;Reach Overhead;Stand    Examination-Participation Restrictions  Cleaning;Community Activity    Stability/Clinical Decision Making  Stable/Uncomplicated    Rehab Potential  Good    PT Frequency  1x / week    PT Duration  8 weeks    PT Treatment/Interventions  ADLs/Self Care Home Management;Canalith Repostioning;Cryotherapy;Moist Heat;Traction;Gait training;Stair training;Functional mobility training;Therapeutic activities;Therapeutic exercise;Balance training;Neuromuscular re-education;Patient/family education;Manual techniques;Passive range of motion;Dry needling;Vestibular;Taping    PT Next Visit Plan  PATIENT HAS 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   How has she tolerated heel wedge in L shoe-incr. LBP-progress from more than 2 hours as tolerated.  have her continue to increase time she is wearing and may try short walk outside with husband with heel wege.  Add to HEP: cervical isometrics, upper back and shoulder strengthening, progress x1 viewing.  Standing balance with head turns - EO, varied surfaces.  If her dermatologist cleared [place] to perform TDN, I will do at next visit-SHE IS ALLERGIC TO NICKEL.    Consulted and Agree with Plan of Care  Patient;Family member/caregiver    Family Member Consulted  husband [name]       Patient will benefit from skilled therapeutic intervention in order to improve the following deficits and impairments:  Decreased balance, Dizziness, Pain, Decreased range of motion, Difficulty walking  Visit Diagnosis: Dizziness and giddiness  Unsteadiness on  feet  Difficulty in walking, not elsewhere classified  Cervicalgia     Problem List Patient Active Problem List   Diagnosis Date Noted  . Benign paroxysmal positional vertigo 04/20/2018  . Genetic testing 04/14/2016  . Malignant neoplasm of central portion of right female breast ([place]) 01/08/2016  . Cholelithiasis 10/13/2013  . Choledocholithiasis 10/13/2013  . Acute biliary pancreatitis 10/09/2013  . Endometrial adenocarcinoma ([place]) 03/16/2012    Broox Lonigro L 08/08/2019, 10:31 AM  [facility] [address] [place], [place], [address] Phone: [phone]   Fax:  [phone]  Name: Raven Marks MRN: [id number] Date of Birth: 1956/04/21  Geoffry Paradise, PT,DPT 08/08/19 10:31 AM Phone: [phone] Fax: [phone]

## 2019-08-08 NOTE — Patient Instructions (Signed)
  Instructions Gaze Stabilization: Sitting    Keeping eyes on target on wall 3 feet away, and move head side to side for _30___ seconds.  Repeat 2-3 times a day. Copyright  VHI. All rights reserved.   Gaze Stabilization: Tip Card  1.Target must remain in focus, not blurry, and appear stationary while head is in motion. 2.Perform exercises with small head movements (45 to either side of midline). 3.Increase speed of head motion so long as target is in focus. 4.If you wear eyeglasses, be sure you can see target through lens (therapist will give specific instructions for bifocal / progressive lenses). 5.These exercises may provoke dizziness or nausea. Work through these symptoms. If too dizzy, slow head movement slightly. Rest between each exercise. 6.Exercises demand concentration; avoid distractions.  Gaze Stabilization: Standing Feet Apart (Compliant Surface)    Feet apart on pillow, keeping eyes on target on wall __3__ feet away, tilt head down 15-30 and move head side to side for __60__ seconds.  Do __2__ sessions per day.   Copyright  VHI. All rights reserved.        Flexibility: Upper Trapezius Stretch    Gently grasp right side of head while reaching behind back with other hand. Tilt head away until a gentle stretch is felt. Hold __30__ seconds. Repeat __2__ times per set.  Do __2-3_ sessions per day.  http://orth.exer.us/341   Copyright  VHI. All rights reserved.

## 2019-08-15 ENCOUNTER — Ambulatory Visit: Payer: Medicare Other | Admitting: Physical Therapy

## 2019-08-15 ENCOUNTER — Other Ambulatory Visit: Payer: Self-pay

## 2019-08-15 ENCOUNTER — Encounter: Payer: Self-pay | Admitting: Physical Therapy

## 2019-08-15 VITALS — BP 142/89

## 2019-08-15 DIAGNOSIS — R42 Dizziness and giddiness: Secondary | ICD-10-CM | POA: Diagnosis not present

## 2019-08-15 DIAGNOSIS — R262 Difficulty in walking, not elsewhere classified: Secondary | ICD-10-CM

## 2019-08-15 DIAGNOSIS — M542 Cervicalgia: Secondary | ICD-10-CM

## 2019-08-15 DIAGNOSIS — R2681 Unsteadiness on feet: Secondary | ICD-10-CM

## 2019-08-15 NOTE — Therapy (Signed)
[facility] [address] [place], [place], [address] Phone: [phone]   Fax:  [phone]  Physical Therapy Treatment  Patient Details  Name: Raven Marks MRN: [id number] Date of Birth: March 10, 1956 Referring Provider (PT): Melida Quitter, MD   Encounter Date: 08/15/2019  PT End of Session - 08/15/19 1039    Visit Number  6    Number of Visits  9    Date for PT Re-Evaluation  09/05/19    Authorization Type  [employer] Medicare; $20 copay.  NO VL.  10th visit PN    PT Start Time  [id number]    PT Stop Time  1025    PT Time Calculation (min)  49 min    Activity Tolerance  Patient tolerated treatment well    Behavior During Therapy  WFL for tasks assessed/performed       Past Medical History:  Diagnosis Date  . Cancer of central portion of right female breast ([place]) 01/08/2016   stage 1A  . Complication of anesthesia    vertigo  . Congenital absence of femur    right  . Endometrioid adenocarcinoma    Stage IA, grade 2  . GERD (gastroesophageal reflux disease)   . History of external beam radiation therapy 10/27/16-12/24/16   right breast 50.4 Gy in 28 fracitons, boost 10 Gy in 5 fractions  . PONV (postoperative nausea and vomiting)   . 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 Spinal stenosis   . Vertigo    associated with anesthesia  . Vitamin D deficiency 09/2012    Past Surgical History:  Procedure Laterality Date  . CESAREAN SECTION    . CHOLECYSTECTOMY N/A 10/11/2013   Procedure: LAPAROSCOPIC CHOLECYSTECTOMY WITH INTRAOPERATIVE CHOLANGIOGRAM;  Surgeon: Joyice Faster. Cornett, MD;  Location: [facility];  Service: General;  Laterality: N/A;  . ERCP N/A 10/12/2013   Procedure: ENDOSCOPIC RETROGRADE CHOLANGIOPANCREATOGRAPHY (ERCP);  Surgeon: Missy Sabins, MD;  Location: [facility] ENDOSCOPY;  Service: Endoscopy;  Laterality: N/A;  . PARTIAL MASTECTOMY WITH AXILLARY SENTINEL LYMPH NODE BIOPSY Right 03/09/16   at [facility]  . REPLACEMENT TOTAL KNEE Left 2010   Dr. Maureen Ralphs  . ROBOTIC ASSISTED TOTAL HYSTERECTOMY WITH BILATERAL SALPINGO OOPHERECTOMY  10/08/2010   Robotic, BSO, bilateral pelvic/ R periaortic LND    Vitals:   08/15/19 1040  BP: (!) 142/89    Subjective Assessment - 08/15/19 0949    Subjective  Dizziness flared up when she did letter exercises in standing.  Heel wedge - no pain while wearing heel wedge, pain when she removes it.  Wearing 2 hours.    Pertinent History  vitamin D deficiency, spinal stenosis, RLE prosthesis due to birth defect - congenital absence of R femur, R breast CA treated with chemo and radiation, endometrioid adenocarcinoma, Left TKA and neck pain    Diagnostic tests  MRI: Moderate multilevel cervical disc and facet degeneration. No significant spinal stenosis.2. Mild-to-moderate multilevel neural foraminal stenosis as above    Currently in Pain?  No/denies                        Vestibular Treatment/Exercise - 08/15/19 0955      Vestibular Treatment/Exercise   Vestibular Treatment Provided  Gaze    Gaze Exercises  X1 Viewing Horizontal      X1 Viewing Horizontal   Foot Position  standing feet apart, added chair to R for support  Reps  2    Comments  decreased time to 45 sec due to moderate symptoms         Balance Exercises - 08/15/19 1035      Balance Exercises: Standing   Standing Eyes Opened  Narrow base of support (BOS);Head turns;Foam/compliant surface;Solid surface;Other reps (comment)   10 reps head turns, 5 each diagonal, staggered stance R/L   Standing Eyes Closed  Narrow base of support (BOS);Solid surface;10 secs;2 reps   staggered stance R/L, 2 sets each side       PT Education - 08/15/19 1037    Education Details  reviewed proper set up and performance of gaze adaptation exercises, reviewed technique for upper trap stretch.  Pt also asking why she has never had an MRI; discussed rationale for imaging and  decreased need for imaging if pt is not showing other concerning neurological symptoms.    Person(s) Educated  Patient    Methods  Explanation;Demonstration    Comprehension  Verbalized understanding;Returned demonstration       PT Short Term Goals - 08/08/19 0939      PT SHORT TERM GOAL #1   Title  Pt will demonstrate independence with initial HEP.    Time  4    Period  Weeks    Status  Achieved    Target Date  08/06/19      PT SHORT TERM GOAL #2   Title  Pt will participate in further assessment of cervical ROM/pain and will initiate cervical HEP to improve ROM and input to vestibular system    Time  4    Period  Weeks    Status  Achieved    Target Date  08/06/19      PT SHORT TERM GOAL #3   Title  Pt will participate in further assessment of balance during functional gait with FGA    Baseline  17/30 on 08/08/19    Time  4    Period  Weeks    Status  Partially Met    Target Date  08/06/19        PT Long Term Goals - 07/18/19 1938      PT LONG TERM GOAL #1   Title  Pt will demonstrate indpendence with final vestibular and balance HEP  (LTG due by 09/05/2019)    Time  8    Period  Weeks    Status  New      PT LONG TERM GOAL #2   Title  Pt will report returning to walking on trails and visiting state parks with husband    Time  8    Period  Weeks    Status  New      PT LONG TERM GOAL #3   Title  Pt will demonstrate 4 point improvement in FGA    Baseline  16/30    Time  8    Period  Weeks    Status  Revised      PT LONG TERM GOAL #4   Title  Pt will demonstrate 8-10 deg increase in cervical functional ROM for increased vestibular input    Baseline  TBD    Time  8    Period  Weeks    Status  New      PT LONG TERM GOAL #5   Title  Pt will improve use of VOR as indicated by ability to perform x1 viewing horizontal and VOR cancellation for visual flow x 2 minutes without UE support  Time  8    Period  Weeks    Status  Revised            Plan -  08/15/19 1040    Clinical Impression Statement  Due to pt reporting significant increase in symptoms when performing HEP continued to review and clarify technique and sequencing of gaze adaptation and stretches.  Began to incorporate standing balance training with more narrow BOS, with head turns and with vision removed.  Pt reported muscle fatigue in hips with standing balance and would like to also focus on hip strengthening.  Plan to add 4 more visits to continue to address balance deficits and vestibular impairments.  Pt verbalized agreement.    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, R breast CA treated with chemo and radiation, RUE restricted, endometrioid adenocarcinoma, Left TKA and neck pain    Examination-Activity Limitations  Bed Mobility;Bend;Locomotion Level;Reach Overhead;Stand    Examination-Participation Restrictions  Cleaning;Community Activity    Stability/Clinical Decision Making  Stable/Uncomplicated    Rehab Potential  Good    PT Frequency  1x / week    PT Duration  8 weeks    PT Treatment/Interventions  ADLs/Self Care Home Management;Canalith Repostioning;Cryotherapy;Moist Heat;Traction;Gait training;Stair training;Functional mobility training;Therapeutic activities;Therapeutic exercise;Balance training;Neuromuscular re-education;Patient/family education;Manual techniques;Passive range of motion;Dry needling;Vestibular;Taping    PT Next Visit Plan  PATIENT HAS 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  Assess BP on LUE - RUE restricted.   Focus on glute med and proximal hip strengthening for balance.  Balance reaction training.  How is she tolerating wearing the heel wedge 4 hours?  How is she tolerating x1  viewing in standing?  Add cervical isometrics to her HEP.    Consulted and Agree with Plan of Care  Patient;Family member/caregiver    Family Member Consulted  husband [name]       Patient will benefit from skilled therapeutic intervention in order to improve the following deficits and impairments:  Decreased balance, Dizziness, Pain, Decreased range of motion, Difficulty walking  Visit Diagnosis: Dizziness and giddiness  Unsteadiness on feet  Difficulty in walking, not elsewhere classified  Cervicalgia     Problem List Patient Active Problem List   Diagnosis Date Noted  . Benign paroxysmal positional vertigo 04/20/2018  . Genetic testing 04/14/2016  . Malignant neoplasm of central portion of right female breast ([place]) 01/08/2016  . Cholelithiasis 10/13/2013  . Choledocholithiasis 10/13/2013  . Acute biliary pancreatitis 10/09/2013  . Endometrial adenocarcinoma ([place]) 03/16/2012    Rico Junker, PT, DPT 08/15/19    10:49 AM    [facility] [address] [place], [place], [address] Phone: [phone]   Fax:  [phone]  Name: KEARI MIU MRN: [id number] Date of Birth: 06/05/1956

## 2019-08-15 NOTE — Patient Instructions (Signed)
Gaze Stabilization - Tip Card  1.Target must remain in focus, not blurry, and appear stationary while head is in motion. 2.Perform exercises with small head movements (45 to either side of midline). 3.Increase speed of head motion so long as target is in focus. 4.If you wear eyeglasses, be sure you can see target through lens (therapist will give specific instructions for bifocal / progressive lenses). 5.These exercises may provoke dizziness or nausea. Work through these symptoms. If too dizzy, slow head movement slightly. Rest between each exercise. 6.Exercises demand concentration; avoid distractions. 7.For safety, perform standing exercises close to a counter, wall, corner, or next to someone.  Copyright  VHI. All rights reserved.   Gaze Stabilization - Standing Feet Apart   Position letter on wall 3 feet away, at nose level so that when you tilt your chin down the letter will be at eye level.   Feet shoulder width apart with a chair on your right side for support if needed, keeping eyes on target on wall 3 feet away, tilt head down slightly and move head side to side for 45 seconds.  Do 2-3 sessions per day, morning/lunch/evening. Keep your symptoms at mild, if they go up to moderate, stop and rest.      Flexibility: Upper Trapezius Stretch    Gently grasp right side of head while reaching behind back with other hand. Tilt head away until a gentle stretch is felt. Hold __30__ seconds. Repeat __2__ times per set.  Do __2-3_ sessions per day.

## 2019-08-20 ENCOUNTER — Other Ambulatory Visit: Payer: Self-pay

## 2019-08-20 ENCOUNTER — Encounter: Payer: Self-pay | Admitting: Physical Therapy

## 2019-08-20 ENCOUNTER — Ambulatory Visit: Payer: Medicare Other | Admitting: Physical Therapy

## 2019-08-20 DIAGNOSIS — R262 Difficulty in walking, not elsewhere classified: Secondary | ICD-10-CM

## 2019-08-20 DIAGNOSIS — R2681 Unsteadiness on feet: Secondary | ICD-10-CM

## 2019-08-20 DIAGNOSIS — M542 Cervicalgia: Secondary | ICD-10-CM

## 2019-08-20 DIAGNOSIS — R42 Dizziness and giddiness: Secondary | ICD-10-CM

## 2019-08-20 NOTE — Therapy (Signed)
[facility] [address] [place], [place], [address] Phone: [phone]   Fax:  [phone]  Physical Therapy Treatment  Patient Details  Name: Raven Marks MRN: [id number] Date of Birth: 14-Jun-1956 Referring Provider (PT): Melida Quitter, MD   Encounter Date: 08/20/2019  PT End of Session - 08/20/19 1631    Visit Number  7    Number of Visits  9    Date for PT Re-Evaluation  09/05/19    Authorization Type  [employer] Medicare; $20 copay.  NO VL.  10th visit PN    PT Start Time  1450    PT Stop Time  1535    PT Time Calculation (min)  45 min    Activity Tolerance  Patient tolerated treatment well    Behavior During Therapy  WFL for tasks assessed/performed       Past Medical History:  Diagnosis Date  . Cancer of central portion of right female breast ([place]) 01/08/2016   stage 1A  . Complication of anesthesia    vertigo  . Congenital absence of femur    right  . Endometrioid adenocarcinoma    Stage IA, grade 2  . GERD (gastroesophageal reflux disease)   . History of external beam radiation therapy 10/27/16-12/24/16   right breast 50.4 Gy in 28 fracitons, boost 10 Gy in 5 fractions  . PONV (postoperative nausea and vomiting)   . 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 Spinal stenosis   . Vertigo    associated with anesthesia  . Vitamin D deficiency 09/2012    Past Surgical History:  Procedure Laterality Date  . CESAREAN SECTION    . CHOLECYSTECTOMY N/A 10/11/2013   Procedure: LAPAROSCOPIC CHOLECYSTECTOMY WITH INTRAOPERATIVE CHOLANGIOGRAM;  Surgeon: Joyice Faster. Cornett, MD;  Location: [facility];  Service: General;  Laterality: N/A;  . ERCP N/A 10/12/2013   Procedure: ENDOSCOPIC RETROGRADE CHOLANGIOPANCREATOGRAPHY (ERCP);  Surgeon: Missy Sabins, MD;  Location: [facility] ENDOSCOPY;  Service: Endoscopy;  Laterality: N/A;  . PARTIAL MASTECTOMY WITH AXILLARY SENTINEL LYMPH NODE BIOPSY Right 03/09/16    at [facility]  . REPLACEMENT TOTAL KNEE Left 2010   Dr. Maureen Ralphs  . ROBOTIC ASSISTED TOTAL HYSTERECTOMY WITH BILATERAL SALPINGO OOPHERECTOMY  10/08/2010   Robotic, BSO, bilateral pelvic/ R periaortic LND    There were no vitals filed for this visit.  Subjective Assessment - 08/20/19 1446    Subjective  Pt states feeling sore after the last session. Pt states wearing heel wedge daily all day since last session and has had some back pain but it is likely d/t being active over the weekend putting up Christmas lights. Overall, heel wedge is going well but was unable to go for a walk. She woke up with a migraine this morning which caused dizziness symptoms but took medication and since then feels better.    Pertinent History  vitamin D deficiency, spinal stenosis, RLE prosthesis due to birth defect - congenital absence of R femur, R breast CA treated with chemo and radiation, endometrioid adenocarcinoma, Left TKA and neck pain    Diagnostic tests  MRI: Moderate multilevel cervical disc and facet degeneration. No significant spinal stenosis.2. Mild-to-moderate multilevel neural foraminal stenosis as above    Currently in Pain?  No/denies                       [facility] Adult PT Treatment/Exercise - 08/20/19 1550  Exercises   Exercises  Knee/Hip      Knee/Hip Exercises: Standing   SLS  Pt performed SLS with limited-WB LE on step in front of self to steady. Pt used light touch bilateral UE support as the focus of the exercise was to increase WB on rear LE to facilitate isometric contraction of glute med. Pt performed exercise 3x10s each LE.     Other Standing Knee Exercises  Side stepping in // bars with red theraband and light BUE support x5 laps each side    Other Standing Knee Exercises  Attempted isometric hip abduction in // bars however pt demonstrated significant weakness and postural changes leading to unsteadiness. PT then modified exercise, see SLS.               PT Education - 08/20/19 1505    Education Details  PT discussed finding a balance between current therapy sessions to tx dizziness and balance and training with her new prosthesis in the future.; updated HEP    Person(s) Educated  Patient    Methods  Explanation;Handout;Demonstration;Verbal cues    Comprehension  Verbalized understanding;Returned demonstration;Verbal cues required       PT Short Term Goals - 08/08/19 0939      PT SHORT TERM GOAL #1   Title  Pt will demonstrate independence with initial HEP.    Time  4    Period  Weeks    Status  Achieved    Target Date  08/06/19      PT SHORT TERM GOAL #2   Title  Pt will participate in further assessment of cervical ROM/pain and will initiate cervical HEP to improve ROM and input to vestibular system    Time  4    Period  Weeks    Status  Achieved    Target Date  08/06/19      PT SHORT TERM GOAL #3   Title  Pt will participate in further assessment of balance during functional gait with FGA    Baseline  17/30 on 08/08/19    Time  4    Period  Weeks    Status  Partially Met    Target Date  08/06/19        PT Long Term Goals - 07/18/19 1938      PT LONG TERM GOAL #1   Title  Pt will demonstrate indpendence with final vestibular and balance HEP  (LTG due by 09/05/2019)    Time  8    Period  Weeks    Status  New      PT LONG TERM GOAL #2   Title  Pt will report returning to walking on trails and [place] with husband    Time  8    Period  Weeks    Status  New      PT LONG TERM GOAL #3   Title  Pt will demonstrate 4 point improvement in FGA    Baseline  16/30    Time  8    Period  Weeks    Status  Revised      PT LONG TERM GOAL #4   Title  Pt will demonstrate 8-10 deg increase in cervical functional ROM for increased vestibular input    Baseline  TBD    Time  8    Period  Weeks    Status  New      PT LONG TERM GOAL #5   Title  Pt will improve use  of VOR as indicated by ability  to perform x1 viewing horizontal and VOR cancellation for visual flow x 2 minutes without UE support    Time  8    Period  Weeks    Status  Revised            Plan - 08/20/19 1636    Clinical Impression Statement  PT session focused on hip strengthening and balance per patient request. Prior to exercise, PT and pt discussed POC and what may be most beneficial in regard to treating current deficits and incorporating prosthetic training with new prosthesis in the near future. PT will assess appropriateness at next session. During skilled session, pt demonstrated significant lateral hip weakness as noted by postural changes and need for BUE support to steady self. Pt required min-mod verbal cueing for correct exercise technique for desired muscle activation. Pt will continue to benefit from skilled therapy services to address deficits.    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, R breast CA treated with chemo and radiation, RUE restricted, endometrioid adenocarcinoma, Left TKA and neck pain    Examination-Activity Limitations  Bed Mobility;Bend;Locomotion Level;Reach Overhead;Stand    Examination-Participation Restrictions  Cleaning;Community Activity    Stability/Clinical Decision Making  Stable/Uncomplicated    Rehab Potential  Good    PT Frequency  1x / week    PT Duration  8 weeks    PT Treatment/Interventions  ADLs/Self Care Home Management;Canalith Repostioning;Cryotherapy;Moist Heat;Traction;Gait training;Stair training;Functional mobility training;Therapeutic activities;Therapeutic exercise;Balance training;Neuromuscular re-education;Patient/family education;Manual techniques;Passive range of motion;Dry needling;Vestibular;Taping    PT Next Visit Plan  PATIENT HAS  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  Assess BP on LUE - RUE restricted.   How are strengthening exercises going?  Check LTG; recert and include prosthetic training - first appointment with Shirlean Mylar is 12/21 - will need to add visits.  Balance reaction training; balance training with head turns.  Progress x 1 viewing    PT Home Exercise Plan  see pt instructions    Consulted and Agree with Plan of Care  Patient;Family member/caregiver    Family Member Consulted  husband [name]       Patient will benefit from skilled therapeutic intervention in order to improve the following deficits and impairments:  Decreased balance, Dizziness, Pain, Decreased range of motion, Difficulty walking  Visit Diagnosis: Dizziness and giddiness  Unsteadiness on feet  Difficulty in walking, not elsewhere classified  Cervicalgia     Problem List Patient Active Problem List   Diagnosis Date Noted  . Benign paroxysmal positional vertigo 04/20/2018  . Genetic testing 04/14/2016  . Malignant neoplasm of central portion of right female breast ([place]) 01/08/2016  . Cholelithiasis 10/13/2013  . Choledocholithiasis 10/13/2013  . Acute biliary pancreatitis 10/09/2013  . Endometrial adenocarcinoma ([place]) 03/16/2012   Juliann Pulse SPT  Juliann Pulse 08/20/2019, 9:38 PM  [facility] [address] [place], [place], [address] Phone: [phone]   Fax:  [phone]  Name: Raven Marks MRN: [id number] Date of Birth: 1956/06/15

## 2019-08-20 NOTE — Patient Instructions (Addendum)
Gaze Stabilization - Tip Card  1.Target must remain in focus, not blurry, and appear stationary while head is in motion. 2.Perform exercises with small head movements (45 to either side of midline). 3.Increase speed of head motion so long as target is in focus. 4.If you wear eyeglasses, be sure you can see target through lens (therapist will give specific instructions for bifocal / progressive lenses). 5.These exercises may provoke dizziness or nausea. Work through these symptoms. If too dizzy, slow head movement slightly. Rest between each exercise. 6.Exercises demand concentration; avoid distractions. 7.For safety, perform standing exercises close to a counter, wall, corner, or next to someone.  Copyright  VHI. All rights reserved.   Gaze Stabilization - Standing Feet Apart   Position letter on wall 3 feet away, at nose level so that when you tilt your chin down the letter will be at eye level.   Feet shoulder width apart with a chair on your right side for support if needed, keeping eyes on target on wall 3 feet away, tilt head down slightly and move head side to side for 45 seconds.  Do 2-3 sessions per day, morning/lunch/evening. Keep your symptoms at mild, if they go up to moderate, stop and rest.      Flexibility: Upper Trapezius Stretch    Gently grasp right side of head while reaching behind back with other hand. Tilt head away until a gentle stretch is felt. Hold __30__ seconds. Repeat __2__ times per set.  Do __2-3_ sessions per day.   Hip Abduction: Side-Stepping: Monster Steps (Eccentric)    With band around thighs or ankles, step out to side into the resistance, allow opposite leg to meet it slowly.  Perform to the left and right down the counter.  Hold counter lightly.  Perform 4 laps down and back.      Standing with One foot on Step    Stand facing your stairs, place one foot on bottom step, make sure most of the weight is on the back leg.  Hold lightly  with hands or no hands for 10 seconds.  Switch legs and repeat.  Perform 3 reps on each side. Start with one time a day and progress to two times a day.

## 2019-08-29 ENCOUNTER — Encounter: Payer: Self-pay | Admitting: Physical Therapy

## 2019-08-29 ENCOUNTER — Other Ambulatory Visit: Payer: Self-pay

## 2019-08-29 ENCOUNTER — Ambulatory Visit: Payer: Medicare Other | Attending: Otolaryngology | Admitting: Physical Therapy

## 2019-08-29 VITALS — BP 150/99 | HR 84

## 2019-08-29 DIAGNOSIS — R262 Difficulty in walking, not elsewhere classified: Secondary | ICD-10-CM | POA: Diagnosis present

## 2019-08-29 DIAGNOSIS — R42 Dizziness and giddiness: Secondary | ICD-10-CM | POA: Insufficient documentation

## 2019-08-29 DIAGNOSIS — R293 Abnormal posture: Secondary | ICD-10-CM | POA: Insufficient documentation

## 2019-08-29 DIAGNOSIS — R2681 Unsteadiness on feet: Secondary | ICD-10-CM | POA: Insufficient documentation

## 2019-08-29 DIAGNOSIS — M542 Cervicalgia: Secondary | ICD-10-CM | POA: Insufficient documentation

## 2019-08-29 NOTE — Therapy (Addendum)
[facility] [address] [place], [place], [address] Phone: [phone]   Fax:  [phone]  Physical Therapy Treatment/Re-certification  Patient Details  Name: XIAMARA RANNOW MRN: [id number] Date of Birth: [birth year] Referring Provider (PT): Melida Quitter, MD   Encounter Date: 08/29/2019  PT End of Session - 08/29/19 0930    Visit Number  8    Number of Visits  9    Date for PT Re-Evaluation  09/05/19    Authorization Type  [employer] Medicare; $20 copay.  NO VL.  10th visit PN    PT Start Time  0935    PT Stop Time  1020    PT Time Calculation (min)  45 min    Activity Tolerance  Patient tolerated treatment well    Behavior During Therapy  WFL for tasks assessed/performed       Past Medical History:  Diagnosis Date  . Cancer of central portion of right female breast ([place]) 01/08/2016   stage 1A  . Complication of anesthesia    vertigo  . Congenital absence of femur    right  . Endometrioid adenocarcinoma    Stage IA, grade 2  . GERD (gastroesophageal reflux disease)   . History of external beam radiation therapy 10/27/16-12/24/16   right breast 50.4 Gy in 28 fracitons, boost 10 Gy in 5 fractions  . PONV (postoperative nausea and vomiting)   . 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 Spinal stenosis   . Vertigo    associated with anesthesia  . Vitamin D deficiency 09/2012    Past Surgical History:  Procedure Laterality Date  . CESAREAN SECTION    . CHOLECYSTECTOMY N/A 10/11/2013   Procedure: LAPAROSCOPIC CHOLECYSTECTOMY WITH INTRAOPERATIVE CHOLANGIOGRAM;  Surgeon: Joyice Faster. Cornett, MD;  Location: [facility];  Service: General;  Laterality: N/A;  . ERCP N/A 10/12/2013   Procedure: ENDOSCOPIC RETROGRADE CHOLANGIOPANCREATOGRAPHY (ERCP);  Surgeon: Missy Sabins, MD;  Location: [facility];  Service: Endoscopy;  Laterality: N/A;  . PARTIAL MASTECTOMY WITH AXILLARY SENTINEL LYMPH NODE BIOPSY  Right 03/09/16   at [facility]  . REPLACEMENT TOTAL KNEE Left 2010   Dr. Maureen Ralphs  . ROBOTIC ASSISTED TOTAL HYSTERECTOMY WITH BILATERAL SALPINGO OOPHERECTOMY  10/08/2010   Robotic, BSO, bilateral pelvic/ R periaortic LND    Vitals:   08/29/19 1020  BP: (!) 150/99  Pulse: 84    Subjective Assessment - 08/29/19 0928    Subjective  She has had a couple 'dizzy spells' this past week on Thankgiving night and when there was rainy weather. She states when doing R neck stretch and when getting out of the car fast she gets dizzy. Pt had soreness in low back and R hip that started 3 days after performing new HEP/PT session limiting her ability to walk that is still sore today however she is able to ambulate into clinic with SPc mod-I. She is wearing the heel wedge daily now, hasn't taken it out of her shoe.    Pertinent History  vitamin D deficiency, spinal stenosis, RLE prosthesis due to birth defect - congenital absence of R femur, R breast CA treated with chemo and radiation, endometrioid adenocarcinoma, Left TKA and neck pain    Diagnostic tests  MRI: Moderate multilevel cervical disc and facet degeneration. No significant spinal stenosis.2. Mild-to-moderate multilevel neural foraminal stenosis as above    Currently in Pain?  No/denies         [facility]  PT Assessment - 08/29/19 1038      Assessment   Medical Diagnosis  Dizziness, R Transfemoral amputation    Referring Provider (PT)  Melida Quitter, MD    Onset Date/Surgical Date  06/26/19    Hand Dominance  Right    Prior Therapy  yes, multiple times.  Will need therapy for new prosthesis      AROM   Overall AROM   Deficits    AROM Assessment Site  Cervical    Cervical Flexion  35 no tightness, no pain or dizziness    Cervical Extension  45, no pain or dizziness     Cervical - Right Side Bend  30, no pain or dizziness     Cervical - Left Side Bend  20, tightness but no pain or dizziness.  Tends to compensate with L rotation    Cervical -  Right Rotation  50, no pain or dizziness    Cervical - Left Rotation  56, no pain or dizzines       Functional Gait  Assessment   Gait assessed   Yes    Gait Level Surface  Walks 20 ft in less than 7 sec but greater than 5.5 sec, uses assistive device, slower speed, mild gait deviations, or deviates 6-10 in outside of the 12 in walkway width.    Change in Gait Speed  Able to change speed, demonstrates mild gait deviations, deviates 6-10 in outside of the 12 in walkway width, or no gait deviations, unable to achieve a major change in velocity, or uses a change in velocity, or uses an assistive device.    Gait with Horizontal Head Turns  Performs head turns smoothly with no change in gait. Deviates no more than 6 in outside 12 in walkway width    Gait with Vertical Head Turns  Performs head turns with no change in gait. Deviates no more than 6 in outside 12 in walkway width.    Gait and Pivot Turn  Pivot turns safely within 3 sec and stops quickly with no loss of balance.    Step Over Obstacle  Is able to step over one shoe box (4.5 in total height) but must slow down and adjust steps to clear box safely. May require verbal cueing.    Gait with Narrow Base of Support  Ambulates less than 4 steps heel to toe or cannot perform without assistance.    Gait with Eyes Closed  Walks 20 ft, uses assistive device, slower speed, mild gait deviations, deviates 6-10 in outside 12 in walkway width. Ambulates 20 ft in less than 9 sec but greater than 7 sec.    Ambulating Backwards  Walks 20 ft, uses assistive device, slower speed, mild gait deviations, deviates 6-10 in outside 12 in walkway width.    Steps  Two feet to a stair, must use rail.    Total Score  19    FGA comment:  19/30: indicates high falls risk                   [facility] Adult PT Treatment/Exercise - 08/29/19 1038      Ambulation/Gait   Ambulation/Gait  Yes    Ambulation/Gait Assistance  6: Modified independent (Device/Increase  time);5: Supervision    Ambulation/Gait Assistance Details  intermittent gait during FGA assessment    Assistive device  Straight cane;Prosthesis    Gait Pattern  Step-to pattern;Decreased arm swing - left;Decreased step length - left;Decreased stance time - right;Decreased stride length;Decreased  hip/knee flexion - right;Decreased hip/knee flexion - left;Decreased dorsiflexion - right;Decreased dorsiflexion - left;Decreased weight shift to right;Antalgic;Lateral trunk lean to left;Decreased trunk rotation;Wide base of support    Ambulation Surface  Indoor;Level      Therapeutic Activites    Therapeutic Activities  Other Therapeutic Activities    Other Therapeutic Activities  PT educated on sitting posture and how a TFA prosthesis can elevate the hip causing the pelvis to not be leveled which can be a contributer to low back pain and affect ROM/pain up the chain. Pt performed rest of session in sitting (during cervical and VOR assessment) with elevated surface (two towels) under the L hip to level pelvis. Pt performed lateral trunk shift x5 requiring max verbal and tactile cues for technique and safety to prevent posterior LOB. After activity, pt had a dizzy spell in which vitals were monitored, no nystagmus noted. Pt demonstrated nervous behavior with R hand shakiness and inc RR.       Vestibular Treatment/Exercise - 08/29/19 1016      Vestibular Treatment/Exercise   Vestibular Treatment Provided  Gaze    Gaze Exercises  X1 Viewing Horizontal      X1 Viewing Horizontal   Foot Position  seated    Reps  1    Comments  90 sec, stopped d/t dizziness; pt had a dizzy spell prior to VOR so modified to seated today            PT Education - 08/29/19 1044    Education Details  PT discussed effects of un-even pelvis in sitting and effects of stress on 'dizzy spells' d/t pt elevated stress level over the holiday and holding her tension in upper trap/cervical area    Person(s) Educated  Patient     Methods  Explanation;Demonstration;Verbal cues;Tactile cues    Comprehension  Verbalized understanding;Returned demonstration       PT Short Term Goals - 08/29/19 1037      PT SHORT TERM GOAL #1   Title  Pt will demonstrate independence with initial HEP.    Time  4    Period  Weeks    Status  Achieved    Target Date  08/06/19      PT SHORT TERM GOAL #2   Title  Pt will participate in further assessment of cervical ROM/pain and will initiate cervical HEP to improve ROM and input to vestibular system    Time  4    Period  Weeks    Status  Achieved    Target Date  08/06/19      PT SHORT TERM GOAL #3   Title  Pt will participate in further assessment of balance during functional gait with FGA    Baseline  08/29/19: 19/30    Time  4    Period  Weeks    Status  Achieved    Target Date  08/06/19        PT Short Term Goals - 08/29/19 1037      PT SHORT TERM GOAL #1   Title  Pt will continue vestibular and cervical HEP and will initiate initial prosthetic HEP    Time  4    Period  Weeks    Status  Revised    Target Date  09/28/19      PT SHORT TERM GOAL #2   Title  Pt will demonstrate improved resting cervical posture positioned ot midline    Time  4    Period  Weeks  Status  Revised    Target Date  09/28/19      PT SHORT TERM GOAL #3   Title  --    Baseline  --    Time  --    Period  --    Status  --    Target Date  --      PT SHORT TERM GOAL #4   Title  Patient will tolerate wearing the new prosthesis >/= 2 hours 2x/day without skin integrity issues    Time  1    Period  Months    Status  New    Target Date  09/28/19      PT SHORT TERM GOAL #5   Title  Patient will verbalize understanding of proper prosthetic care utilization    Time  1    Period  Months    Status  New    Target Date  09/28/19        PT Long Term Goals - 08/29/19 0941      PT LONG TERM GOAL #1   Title  Pt will demonstrate indpendence with final vestibular and balance HEP  (LTG  due by 09/05/2019)    Time  8    Period  Weeks    Status  On-going      PT LONG TERM GOAL #2   Title  Pt will report returning to walking on trails and visiting [place] with husband    Baseline  08/29/19: Pt reports walking on trails with husband at least 3x/week regularly    Time  8    Period  Weeks    Status  Achieved      PT LONG TERM GOAL #3   Title  Pt will demonstrate 4 point improvement in FGA    Baseline  08/29/19: 19/30    Time  8    Period  Weeks    Status  On-going      PT LONG TERM GOAL #4   Title  Pt will demonstrate 8-10 deg increase in cervical functional ROM for increased vestibular input    Baseline  08/29/19: pt demonstrated mild improvement in extension, R side bending & rotation and a 16 deg improvement in L rotation    Time  8    Period  Weeks    Status  On-going      PT LONG TERM GOAL #5   Title  Pt will improve use of VOR as indicated by ability to perform x1 viewing horizontal and VOR cancellation for visual flow x 2 minutes without UE support    Baseline  08/29/19: see VORx1 viewing comments, however pt is progressing VORx1 for HEP    Time  8    Period  Weeks    Status  On-going         PT Long Term Goals - 08/29/19 0941      PT LONG TERM GOAL #1   Title  Pt will demonstrate indpendence with final vestibular and balance HEP  (new LTG due by 10/28/19)    Time  8    Period  Weeks    Status  Revised    Target Date  10/27/18      PT LONG TERM GOAL #2   Title  Pt will report returning to walking on trails and visiting state parks with husband with new prosthesis    Baseline  08/29/19: Pt reports walking on trails with husband at least 3x/week regularly with current prosthesis  Time  8    Period  Weeks    Status  Revised    Target Date  10/28/19      PT LONG TERM GOAL #3   Title  Pt will improve FGA score to >/= 22/30 with new prosthesis    Baseline  08/29/19: 19/30 with old prosthesis    Time  8    Period  Weeks    Status  Revised    Target  Date  10/28/19      PT LONG TERM GOAL #4   Title  Pt will demonstrate 8-10 deg increase in cervical functional ROM for increased vestibular input    Baseline  08/29/19: pt demonstrated mild improvement in extension, R side bending & rotation and a 16 deg improvement in L rotation    Time  8    Period  Weeks    Status  On-going    Target Date  10/28/19      PT LONG TERM GOAL #5   Title  Pt will improve use of VOR as indicated by ability to perform x1 viewing horizontal and VOR cancellation for visual flow x 2 minutes without UE support in standing    Baseline  08/29/19: see VORx1 viewing comments, however pt is progressing VORx1 for HEP    Time  8    Period  Weeks    Status  Revised    Target Date  10/28/19      Additional Long Term Goals   Additional Long Term Goals  Yes      PT LONG TERM GOAL #6   Title  Patient will tolerate wearing new prosthesis >90% wake hours with no skin integrity issues of reports of pain    Time  8    Period  Weeks    Status  New    Target Date  10/28/19      PT LONG TERM GOAL #7   Title  Patient will demonstrate ability to donn/doff new prosthesis with supervision only    Time  8    Period  Weeks    Status  New    Target Date  10/28/19      PT LONG TERM GOAL #8   Title  Patient will demonstrate ability to negotiate ramp, curb, and stairs 1-railing with mod-I    Time  8    Period  Weeks    Status  New    Target Date  10/28/19      PT LONG TERM GOAL  #9   TITLE  Patient will ambulate 300' with LRAD and new prosthesis mod-I on various terrain for improved community ambulation    Time  8    Period  Weeks    Status  New    Target Date  10/28/19           Plan - 08/29/19 1053    Clinical Impression Statement  Patient has demonstrated progress towards long term goals. She has increased walking activity at home on the trails negotiating various surfaces. Patient demonstrated a 2 point improvement in the FGA however is still challenged with  ambulating with a narrow BOS, visual system disturbance (EC, gait backwards), and negotiating stairs. Pt has demonstrated some improvement in cervical AROM however continues to demonstrate significant tightness and postural changes that may be related to un-level pelvis in sitting for prolonged periods of time. She has recently received a new prosthesis however requires training prior to use. This new prosthesis and skilled PT  prosthetic training may be beneficial to the overall improvement of gait and balance deficits that the patient continues to exhibit and improve overall independent function. Pt will continue to benefit from skilled therapy services to address deficits.    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, R breast CA treated with chemo and radiation, RUE restricted, endometrioid adenocarcinoma, Left TKA and neck pain    Examination-Activity Limitations  Bed Mobility;Bend;Locomotion Level;Reach Overhead;Stand    Examination-Participation Restrictions  Cleaning;Community Activity    Stability/Clinical Decision Making  Stable/Uncomplicated    Rehab Potential  Good    PT Frequency  1x / week    PT Duration  8 weeks    PT Treatment/Interventions  ADLs/Self Care Home Management;Canalith Repostioning;Cryotherapy;Moist Heat;Traction;Gait training;Stair training;Functional mobility training;Therapeutic activities;Therapeutic exercise;Balance training;Neuromuscular re-education;Patient/family education;Manual techniques;Passive range of motion;Dry needling;Vestibular;Taping;Prosthetic Training    PT Next Visit Plan  PATIENT HAS 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  Assess BP on LUE - RUE restricted.   How are strengthening  exercises going? first appointment with Shirlean Mylar is 12/21 - will need to add visits alt between [facility] schedule.  Balance reaction training; balance training with head turns, balance training with UE ex for core activation and stability. Stepping strategy. Assess lateral shifting. Progress x 1 viewing    Consulted and Agree with Plan of Care  Patient;Family member/caregiver    Family Member Consulted  husband [name]       Patient will benefit from skilled therapeutic intervention in order to improve the following deficits and impairments:  Decreased balance, Dizziness, Pain, Decreased range of motion, Difficulty walking, Abnormal gait  Visit Diagnosis: Dizziness and giddiness  Unsteadiness on feet  Difficulty in walking, not elsewhere classified  Cervicalgia  Abnormal posture     Problem List Patient Active Problem List   Diagnosis Date Noted  . Benign paroxysmal positional vertigo 04/20/2018  . Genetic testing 04/14/2016  . Malignant neoplasm of central portion of right female breast ([place]) 01/08/2016  . Cholelithiasis 10/13/2013  . Choledocholithiasis 10/13/2013  . Acute biliary pancreatitis 10/09/2013  . Endometrial adenocarcinoma ([place]) 03/16/2012   Juliann Pulse SPT  Juliann Pulse 08/29/2019, 12:29 PM  [facility] [address] [place], [place], [address] Phone: [phone]   Fax:  [phone]  Name: MARYEM VALOIS MRN: [id number] Date of Birth: 05-23-1956

## 2019-08-29 NOTE — Patient Instructions (Signed)
Gaze Stabilization - Tip Card  1.Target must remain in focus, not blurry, and appear stationary while head is in motion. 2.Perform exercises with small head movements (45 to either side of midline). 3.Increase speed of head motion so long as target is in focus. 4.If you wear eyeglasses, be sure you can see target through lens (therapist will give specific instructions for bifocal / progressive lenses). 5.These exercises may provoke dizziness or nausea. Work through these symptoms. If too dizzy, slow head movement slightly. Rest between each exercise. 6.Exercises demand concentration; avoid distractions. 7.For safety, perform standing exercises close to a counter, wall, corner, or next to someone.  Copyright  VHI. All rights reserved.   Gaze Stabilization - Standing Feet Apart   Position letter on wall 3 feet away, at nose level so that when you tilt your chin down the letter will be at eye level.   Feet shoulder width apart with a chair on your right side for support if needed, keeping eyes on target on wall 3 feet away, tilt head down slightly and move head side to side for 45 seconds.  Do 2-3 sessions per day, morning/lunch/evening. Keep your symptoms at mild, if they go up to moderate, stop and rest.      Flexibility: Upper Trapezius Stretch    Gently grasp right side of head while reaching behind back with other hand. Tilt head away until a gentle stretch is felt. Hold __30__ seconds. Repeat __2__ times per set.  Do __2-3_ sessions per day.

## 2019-09-05 ENCOUNTER — Encounter: Payer: Medicare Other | Admitting: Physical Therapy

## 2019-09-07 ENCOUNTER — Encounter: Payer: Self-pay | Admitting: Physical Therapy

## 2019-09-07 ENCOUNTER — Ambulatory Visit: Payer: Medicare Other | Admitting: Physical Therapy

## 2019-09-07 ENCOUNTER — Other Ambulatory Visit: Payer: Self-pay

## 2019-09-07 DIAGNOSIS — R42 Dizziness and giddiness: Secondary | ICD-10-CM | POA: Diagnosis not present

## 2019-09-07 DIAGNOSIS — R293 Abnormal posture: Secondary | ICD-10-CM

## 2019-09-07 DIAGNOSIS — M542 Cervicalgia: Secondary | ICD-10-CM

## 2019-09-07 DIAGNOSIS — R2681 Unsteadiness on feet: Secondary | ICD-10-CM

## 2019-09-07 DIAGNOSIS — R262 Difficulty in walking, not elsewhere classified: Secondary | ICD-10-CM

## 2019-09-07 NOTE — Patient Instructions (Signed)
Only sit on the towel when performing exercises to keep neck in alignment.

## 2019-09-07 NOTE — Therapy (Signed)
[facility] [address] [place], [place], [address] Phone: [phone]   Fax:  [phone]  Physical Therapy Treatment  Patient Details  Name: Raven Marks MRN: [id number] Date of Birth: [birth year] Referring Provider (PT): Melida Quitter, MD   Encounter Date: 09/07/2019  PT End of Session - 09/07/19 1015    Visit Number  9    Number of Visits  16    Date for PT Re-Evaluation  0000000   per recert   Authorization Type  [employer] Medicare; $20 copay.  NO VL.  10th visit PN    PT Start Time  0853    PT Stop Time  0935    PT Time Calculation (min)  42 min       Past Medical History:  Diagnosis Date  . Cancer of central portion of right female breast ([place]) 01/08/2016   stage 1A  . Complication of anesthesia    vertigo  . Congenital absence of femur    right  . Endometrioid adenocarcinoma    Stage IA, grade 2  . GERD (gastroesophageal reflux disease)   . History of external beam radiation therapy 10/27/16-12/24/16   right breast 50.4 Gy in 28 fracitons, boost 10 Gy in 5 fractions  . PONV (postoperative nausea and vomiting)   . 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 Spinal stenosis   . Vertigo    associated with anesthesia  . Vitamin D deficiency 09/2012    Past Surgical History:  Procedure Laterality Date  . CESAREAN SECTION    . CHOLECYSTECTOMY N/A 10/11/2013   Procedure: LAPAROSCOPIC CHOLECYSTECTOMY WITH INTRAOPERATIVE CHOLANGIOGRAM;  Surgeon: Joyice Faster. Cornett, MD;  Location: [facility];  Service: General;  Laterality: N/A;  . ERCP N/A 10/12/2013   Procedure: ENDOSCOPIC RETROGRADE CHOLANGIOPANCREATOGRAPHY (ERCP);  Surgeon: Missy Sabins, MD;  Location: [facility] ENDOSCOPY;  Service: Endoscopy;  Laterality: N/A;  . PARTIAL MASTECTOMY WITH AXILLARY SENTINEL LYMPH NODE BIOPSY Right 03/09/16   at [facility]  . REPLACEMENT TOTAL KNEE Left 2010   Dr. Maureen Ralphs  . ROBOTIC ASSISTED TOTAL  HYSTERECTOMY WITH BILATERAL SALPINGO OOPHERECTOMY  10/08/2010   Robotic, BSO, bilateral pelvic/ R periaortic LND    There were no vitals filed for this visit.  Subjective Assessment - 09/07/19 0854    Subjective  Woke up feeling a little lightheaded.  Was very sore from sitting on pillow.  Knee became very sore - felt like it was going to give way.  Is making her nervous about falling.  Would still like to wait until after Christmas to perform assessment with new prosthesis.    Pertinent History  vitamin D deficiency, spinal stenosis, RLE prosthesis due to birth defect - congenital absence of R femur, R breast CA treated with chemo and radiation, endometrioid adenocarcinoma, Left TKA and neck pain    Diagnostic tests  MRI: Moderate multilevel cervical disc and facet degeneration. No significant spinal stenosis.2. Mild-to-moderate multilevel neural foraminal stenosis as above                       [facility] Adult PT Treatment/Exercise - 09/07/19 1002      Therapeutic Activites    Therapeutic Activities  Other Therapeutic Activities    Other Therapeutic Activities  Advised pt to only sit on the pillow for weight shift when performing neck exercises and vestibular exercises to maintain alignment, will need to gradually progress and increase time pt  is sitting shifted.  Continued to share clinical reasoning with patient - pt asking if she should go to another ENT or [facility] vestibular clinic to find out what is going on in her neck.  Discussed that they may be able to do more testing but it may not change the treatment recommendations.  Also continued to review with pt indications of more serious pathology (not found during PT evaluation) that would warrant further medical assessment but also reassured pt that none of these findings were found in her PT evaluation.  Continued to educate pt on role of cervical receptors and how they interact with the vestibular system and balance and how her lack  of movement due to dizziness and posture combined with increased stress this year may be making her system more sensitive to these movements.  Assured pt that PT would continue to monitor and assess.        Exercises   Exercises  Other Exercises    Other Exercises   Following manual therapy performed stretches for LLE: performed supine hip IR/ER, hamstring stretch, IT band stretch, piriformis stretch and hip flexor stretch with pelvis elevated and UE raised x 60 seconds each stretch.        Modalities   Modalities  Cryotherapy      Cryotherapy   Number Minutes Cryotherapy  15 Minutes    Cryotherapy Location  Knee   left   Type of Cryotherapy  Ice pack      Manual Therapy   Manual Therapy  Joint mobilization;Myofascial release;Passive ROM;Manual Traction    Manual therapy comments  mild lightheadedness when returning to sitting after manual therapy; resolved quickly and pt reported, "I feel like I have a new neck and all the stress is out of it."     Joint Mobilization  lateral glides to R at multiple cervical spine levels - upper, mid and lower. Grade II.  Also performed cervical rotation with Grade I-II rotation mobilizations at end range     Myofascial Release  suboccipital release combined with manual traction - pt reports feeling tingling in bilat hands with traction today - released traction and discussed how nerves like to slide and glide and they may be experiencing greater resistance to movement today    Passive ROM  passive stretch of R and L upper trapezius muscles x 90 seconds on each side    Manual Traction  see myofascial release above             PT Education - 09/07/19 1015    Education Details  see TA; advised pt to ice prior to and after trail walk    Person(s) Educated  Patient    Methods  Explanation    Comprehension  Verbalized understanding       PT Short Term Goals - 08/29/19 1037      PT SHORT TERM GOAL #1   Title  Pt will continue vestibular and  cervical HEP and will initiate initial prosthetic HEP    Time  4    Period  Weeks    Status  Revised    Target Date  09/28/19      PT SHORT TERM GOAL #2   Title  Pt will demonstrate improved resting cervical posture positioned ot midline    Time  4    Period  Weeks    Status  Revised    Target Date  09/28/19      PT SHORT TERM GOAL #3   Title  --  Baseline  --    Time  --    Period  --    Status  --    Target Date  --      PT SHORT TERM GOAL #4   Title  Patient will tolerate wearing the new prosthesis >/= 2 hours 2x/day without skin integrity issues    Time  1    Period  Months    Status  New    Target Date  09/28/19      PT SHORT TERM GOAL #5   Title  Patient will verbalize understanding of proper prosthetic care utilization    Time  1    Period  Months    Status  New    Target Date  09/28/19        PT Long Term Goals - 08/29/19 0941      PT LONG TERM GOAL #1   Title  Pt will demonstrate indpendence with final vestibular and balance HEP  (new LTG due by 10/28/19)    Time  8    Period  Weeks    Status  Revised    Target Date  10/27/18      PT LONG TERM GOAL #2   Title  Pt will report returning to walking on trails and [place] with husband with new prosthesis    Baseline  08/29/19: Pt reports walking on trails with husband at least 3x/week regularly with current prosthesis    Time  8    Period  Weeks    Status  Revised    Target Date  10/28/19      PT LONG TERM GOAL #3   Title  Pt will improve FGA score to >/= 22/30 with new prosthesis    Baseline  08/29/19: 19/30 with old prosthesis    Time  8    Period  Weeks    Status  Revised    Target Date  10/28/19      PT LONG TERM GOAL #4   Title  Pt will demonstrate 8-10 deg increase in cervical functional ROM for increased vestibular input    Baseline  08/29/19: pt demonstrated mild improvement in extension, R side bending & rotation and a 16 deg improvement in L rotation    Time  8    Period   Weeks    Status  On-going    Target Date  10/28/19      PT LONG TERM GOAL #5   Title  Pt will improve use of VOR as indicated by ability to perform x1 viewing horizontal and VOR cancellation for visual flow x 2 minutes without UE support in standing    Baseline  08/29/19: see VORx1 viewing comments, however pt is progressing VORx1 for HEP    Time  8    Period  Weeks    Status  Revised    Target Date  10/28/19      Additional Long Term Goals   Additional Long Term Goals  Yes      PT LONG TERM GOAL #6   Title  Patient will tolerate wearing new prosthesis >90% wake hours with no skin integrity issues of reports of pain    Time  8    Period  Weeks    Status  New    Target Date  10/28/19      PT LONG TERM GOAL #7   Title  Patient will demonstrate ability to donn/doff new prosthesis with supervision only  Time  8    Period  Weeks    Status  New    Target Date  10/28/19      PT LONG TERM GOAL #8   Title  Patient will demonstrate ability to negotiate ramp, curb, and stairs 1-railing with mod-I    Time  8    Period  Weeks    Status  New    Target Date  10/28/19      PT LONG TERM GOAL  #9   TITLE  Patient will ambulate 300' with LRAD and new prosthesis mod-I on various terrain for improved community ambulation    Time  8    Period  Weeks    Status  New    Target Date  10/28/19            Plan - 09/07/19 1017    Clinical Impression Statement  Pt continues to report significant increase in soreness and dizziness with adjustments to posture and hip exercises.  Decreased time on pillow to just when performing exercises.  Focus of today on manual therapy and therapeutic exercises for cervical spine, L hip and knee to calm symptoms in preparation for pt going walking at park today.  Pt reported significant improvement in neck after treatment.  Will continue to address and progress towards LTG.    Personal Factors and Comorbidities  Comorbidity 3+;Finances;Past/Current  Experience;Time since onset of injury/illness/exacerbation;Fitness    Comorbidities  vitamin D deficiency, spinal stenosis, RLE prosthesis due to birth defect - congenital absence of R femur, R breast CA treated with chemo and radiation, RUE restricted, endometrioid adenocarcinoma, Left TKA and neck pain    Examination-Activity Limitations  Bed Mobility;Bend;Locomotion Level;Reach Overhead;Stand    Examination-Participation Restrictions  Cleaning;Community Activity    Stability/Clinical Decision Making  Stable/Uncomplicated    Rehab Potential  Good    PT Frequency  1x / week    PT Duration  8 weeks    PT Treatment/Interventions  ADLs/Self Care Home Management;Canalith Repostioning;Cryotherapy;Moist Heat;Traction;Gait training;Stair training;Functional mobility training;Therapeutic activities;Therapeutic exercise;Balance training;Neuromuscular re-education;Patient/family education;Manual techniques;Passive range of motion;Vestibular;Taping;Prosthetic Training;DME Instruction    PT Next Visit Plan  PATIENT HAS 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  Assess BP on LUE - RUE restricted.  10th visit PN.  Only doing sitting on towel when performing exercises - how is LE feeling?  Balance reaction training; balance training with head turns, balance training with UE ex for core activation and stability. Stepping strategy. Assess lateral shifting to R; use of laser for head and neck position retraining.  Progress x 1 viewing    Consulted and Agree with Plan of Care  Patient;Family member/caregiver    Family Member Consulted  husband [name]       Patient will benefit from skilled therapeutic intervention in order to improve the following deficits and impairments:  Decreased balance, Dizziness, Pain, Decreased range of motion, Difficulty walking, Abnormal gait, Decreased strength, Postural dysfunction  Visit Diagnosis: Dizziness and  giddiness  Unsteadiness on feet  Difficulty in walking, not elsewhere classified  Cervicalgia  Abnormal posture     Problem List Patient Active Problem List   Diagnosis Date Noted  . Benign paroxysmal positional vertigo 04/20/2018  . Genetic testing 04/14/2016  . Malignant neoplasm of central portion of right female breast ([place]) 01/08/2016  . Cholelithiasis 10/13/2013  . Choledocholithiasis 10/13/2013  . Acute biliary pancreatitis 10/09/2013  . Endometrial adenocarcinoma ([place]) 03/16/2012  Rico Junker, PT, DPT 09/07/19    10:21 AM    [facility] [address] [place], [place], [address] Phone: [phone]   Fax:  [phone]  Name: MARY-ANN WOZNY MRN: [id number] Date of Birth: [birth year]

## 2019-09-12 ENCOUNTER — Ambulatory Visit: Payer: Medicare Other | Admitting: Physical Therapy

## 2019-09-14 ENCOUNTER — Encounter: Payer: Self-pay | Admitting: Physical Therapy

## 2019-09-14 ENCOUNTER — Ambulatory Visit: Payer: Medicare Other | Admitting: Physical Therapy

## 2019-09-14 ENCOUNTER — Other Ambulatory Visit: Payer: Self-pay

## 2019-09-14 DIAGNOSIS — R42 Dizziness and giddiness: Secondary | ICD-10-CM

## 2019-09-14 DIAGNOSIS — R2681 Unsteadiness on feet: Secondary | ICD-10-CM

## 2019-09-14 DIAGNOSIS — R262 Difficulty in walking, not elsewhere classified: Secondary | ICD-10-CM

## 2019-09-14 DIAGNOSIS — M542 Cervicalgia: Secondary | ICD-10-CM

## 2019-09-14 DIAGNOSIS — R293 Abnormal posture: Secondary | ICD-10-CM

## 2019-09-14 NOTE — Therapy (Signed)
[facility] [address] [place], [place], [address] Phone: [phone]   Fax:  [phone]  Physical Therapy Treatment & 10th Visit Progress Note  Patient Details  Name: Raven Marks MRN: [id number] Date of Birth: [birth year] Referring Provider (PT): Melida Quitter, MD  Progress Note Reporting Period 07/07/2019 to 09/14/2019  See note below for Objective Data and Assessment of Progress/Goals.    Encounter Date: 09/14/2019  PT End of Session - 09/14/19 1415    Visit Number  10    Number of Visits  16    Date for PT Re-Evaluation  0000000   per recert   Authorization Type  [employer] Medicare; $20 copay.  NO VL.  10th visit PN    PT Start Time  1411    PT Stop Time  1454    PT Time Calculation (min)  43 min    Activity Tolerance  Patient tolerated treatment well    Behavior During Therapy  WFL for tasks assessed/performed       Past Medical History:  Diagnosis Date  . Cancer of central portion of right female breast ([place]) 01/08/2016   stage 1A  . Complication of anesthesia    vertigo  . Congenital absence of femur    right  . Endometrioid adenocarcinoma    Stage IA, grade 2  . GERD (gastroesophageal reflux disease)   . History of external beam radiation therapy 10/27/16-12/24/16   right breast 50.4 Gy in 28 fracitons, boost 10 Gy in 5 fractions  . PONV (postoperative nausea and vomiting)   . 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 Spinal stenosis   . Vertigo    associated with anesthesia  . Vitamin D deficiency 09/2012    Past Surgical History:  Procedure Laterality Date  . CESAREAN SECTION    . CHOLECYSTECTOMY N/A 10/11/2013   Procedure: LAPAROSCOPIC CHOLECYSTECTOMY WITH INTRAOPERATIVE CHOLANGIOGRAM;  Surgeon: Joyice Faster. Cornett, MD;  Location: [facility];  Service: General;  Laterality: N/A;  . ERCP N/A 10/12/2013   Procedure: ENDOSCOPIC RETROGRADE CHOLANGIOPANCREATOGRAPHY (ERCP);   Surgeon: Missy Sabins, MD;  Location: [facility] ENDOSCOPY;  Service: Endoscopy;  Laterality: N/A;  . PARTIAL MASTECTOMY WITH AXILLARY SENTINEL LYMPH NODE BIOPSY Right 03/09/16   at [facility]  . REPLACEMENT TOTAL KNEE Left 2010   Dr. Maureen Ralphs  . ROBOTIC ASSISTED TOTAL HYSTERECTOMY WITH BILATERAL SALPINGO OOPHERECTOMY  10/08/2010   Robotic, BSO, bilateral pelvic/ R periaortic LND    There were no vitals filed for this visit.  Subjective Assessment - 09/14/19 1412    Subjective  Felt great after last session when PT worked on her neck and felt good the remainder of that day. Then she woke up with a migraine, dizziness and light-headedness the following 3 days. Yesterday was a good day and had no issues. Woke up this morning with a slight headache and feeling light headed and did exercises -- they went well. She has ordered new glasses that should come in soon.    Pertinent History  vitamin D deficiency, spinal stenosis, RLE prosthesis due to birth defect - congenital absence of R femur, R breast CA treated with chemo and radiation, endometrioid adenocarcinoma, Left TKA and neck pain    Diagnostic tests  MRI: Moderate multilevel cervical disc and facet degeneration. No significant spinal stenosis.2. Mild-to-moderate multilevel neural foraminal stenosis as above    Currently in Pain?  No/denies  [facility] PT Assessment - 09/14/19 1446      Palpation   Palpation comment  TTP left suboccipitals, palpable trigger point L upper trap, generally guarded                   [facility] Adult PT Treatment/Exercise - 09/14/19 1419      Exercises   Exercises  Other Exercises    Other Exercises   Patient performed self suboccipital release with tennis balls in a pillow case to allow for decreased tension in musculature. Self mobilization with movement using a towel to assist into cervical extension with gentle PA glide 2x10s. Mulligan's towel-assisted cervical rotation stretch bilaterally 2x15s.  Cues for correct technique    pt stated some dizziness from pressure w/subocc. release     Neck Exercises: Seated   Other Seated Exercise  Reviewed HEP upper trap stretch 2x30s bilaterally; required cues for technique and amount of pressure applied to get stretch, tactile cue when performing LUT stretch to prevent significant lateral trunk lean. Performed chin to chest stretch x30s.       Manual Therapy   Manual Therapy  Joint mobilization    Joint Mobilization  football hold -- slight neck flexion with distraction glide      Vestibular Treatment/Exercise - 09/14/19 1426      Vestibular Treatment/Exercise   Vestibular Treatment Provided  Gaze    Gaze Exercises  X1 Viewing Horizontal      X1 Viewing Horizontal   Foot Position  standing with back supported against walll, chair next to patient if needing to steady and SPC in R hand     Reps  2    Comments  x60s mild dizzy/light-headness symptoms, letter stayed in focus, some c/o popping in neck x30s used towel around neck at 45 deg angle for external cue and pt reported no neck symptoms        X1 Viewing Vertical   Foot Position  standing with back supported against walll, chair next to patient if needing to steady and SPC in R hand     Reps  2    Comments  x30s mild symptoms, no nausea             PT Education - 09/14/19 1458    Education Details  updated HEP: x1 viewing while standing with back supported x30s; reviewed HEP stretches; discussed trigger point injections    Person(s) Educated  Patient    Methods  Explanation;Demonstration    Comprehension  Verbalized understanding;Returned demonstration       PT Short Term Goals - 08/29/19 1037      PT SHORT TERM GOAL #1   Title  Pt will continue vestibular and cervical HEP and will initiate initial prosthetic HEP    Time  4    Period  Weeks    Status  Revised    Target Date  09/28/19      PT SHORT TERM GOAL #2   Title  Pt will demonstrate improved resting cervical  posture positioned ot midline    Time  4    Period  Weeks    Status  Revised    Target Date  09/28/19      PT SHORT TERM GOAL #3   Title  --    Baseline  --    Time  --    Period  --    Status  --    Target Date  --      PT SHORT TERM  GOAL #4   Title  Patient will tolerate wearing the new prosthesis >/= 2 hours 2x/day without skin integrity issues    Time  1    Period  Months    Status  New    Target Date  09/28/19      PT SHORT TERM GOAL #5   Title  Patient will verbalize understanding of proper prosthetic care utilization    Time  1    Period  Months    Status  New    Target Date  09/28/19        PT Long Term Goals - 08/29/19 0941      PT LONG TERM GOAL #1   Title  Pt will demonstrate indpendence with final vestibular and balance HEP  (new LTG due by 10/28/19)    Time  8    Period  Weeks    Status  Revised    Target Date  10/27/18      PT LONG TERM GOAL #2   Title  Pt will report returning to walking on trails and [place] with [place] with new prosthesis    Baseline  08/29/19: Pt reports walking on trails with husband at least 3x/week regularly with current prosthesis    Time  8    Period  Weeks    Status  Revised    Target Date  10/28/19      PT LONG TERM GOAL #3   Title  Pt will improve FGA score to >/= 22/30 with new prosthesis    Baseline  08/29/19: 19/30 with old prosthesis    Time  8    Period  Weeks    Status  Revised    Target Date  10/28/19      PT LONG TERM GOAL #4   Title  Pt will demonstrate 8-10 deg increase in cervical functional ROM for increased vestibular input    Baseline  08/29/19: pt demonstrated mild improvement in extension, R side bending & rotation and a 16 deg improvement in L rotation    Time  8    Period  Weeks    Status  On-going    Target Date  10/28/19      PT LONG TERM GOAL #5   Title  Pt will improve use of VOR as indicated by ability to perform x1 viewing horizontal and VOR cancellation for visual flow x 2  minutes without UE support in standing    Baseline  08/29/19: see VORx1 viewing comments, however pt is progressing VORx1 for HEP    Time  8    Period  Weeks    Status  Revised    Target Date  10/28/19      Additional Long Term Goals   Additional Long Term Goals  Yes      PT LONG TERM GOAL #6   Title  Patient will tolerate wearing new prosthesis >90% wake hours with no skin integrity issues of reports of pain    Time  8    Period  Weeks    Status  New    Target Date  10/28/19      PT LONG TERM GOAL #7   Title  Patient will demonstrate ability to donn/doff new prosthesis with supervision only    Time  8    Period  Weeks    Status  New    Target Date  10/28/19      PT LONG TERM GOAL #8   Title  Patient will demonstrate ability to negotiate ramp, curb, and stairs 1-railing with mod-I    Time  8    Period  Weeks    Status  New    Target Date  10/28/19      PT LONG TERM GOAL  #9   TITLE  Patient will ambulate 300' with LRAD and new prosthesis mod-I on various terrain for improved community ambulation    Time  8    Period  Weeks    Status  New    Target Date  10/28/19            Plan - 09/14/19 1459    Clinical Impression Statement  Today's skilled session focused on reviewing HEP cervical stretches, performing cervical exercises and manual techniques, and modifying gaze stabilization exercise. Patient required moderate cueing for form and technique. She was experiencing neck discomfort with x1 viewing at home while standing which could be d/t increased muscle guarding from feeling unsteady. Patient was given modification advice to reduce cervical discomfort. Overall, patient has demonstrated progress towards LTGs as seen by increased walking on trails near home, improved ability to negotiate uneven terrain, and improved dynamic balance as seen by an increased FGA score of 19/30 on 08/29/19. She continues to present with abnormal posture and muscle guarding throughout upper  quarter and has difficulty balancing with narrow BOS and compromised visual system. Patient may benefit from continued skilled therapy to address impairments and progress towards LTGs.    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, R breast CA treated with chemo and radiation, RUE restricted, endometrioid adenocarcinoma, Left TKA and neck pain    Examination-Activity Limitations  Bed Mobility;Bend;Locomotion Level;Reach Overhead;Stand    Examination-Participation Restrictions  Cleaning;Community Activity    Stability/Clinical Decision Making  Stable/Uncomplicated    Rehab Potential  Good    PT Frequency  1x / week    PT Duration  8 weeks    PT Treatment/Interventions  ADLs/Self Care Home Management;Canalith Repostioning;Cryotherapy;Moist Heat;Traction;Gait training;Stair training;Functional mobility training;Therapeutic activities;Therapeutic exercise;Balance training;Neuromuscular re-education;Patient/family education;Manual techniques;Passive range of motion;Vestibular;Taping;Prosthetic Training;DME Instruction    PT Next Visit Plan  Schedule more visits, 1x/week with Shirlean Mylar and Audra.  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  Assess BP on LUE - RUE restricted. Only doing sitting on towel when performing exercises.  How is she feeling with towel stretches?  Continue to progress x 1 viewing.  (did she make an appt with [facility]?)    Consulted and Agree with Plan of Care  Patient       Patient will benefit from skilled therapeutic intervention in order to improve the following deficits and impairments:  Decreased balance, Dizziness, Pain, Decreased range of motion, Difficulty walking, Abnormal gait, Decreased strength,  Postural dysfunction  Visit Diagnosis: Dizziness and giddiness  Unsteadiness on feet  Difficulty in walking, not elsewhere classified  Abnormal posture     Problem List Patient Active Problem List   Diagnosis Date Noted  . Benign paroxysmal positional vertigo 04/20/2018  . Genetic testing 04/14/2016  . Malignant neoplasm of central portion of right female breast ([place]) 01/08/2016  . Cholelithiasis 10/13/2013  . Choledocholithiasis 10/13/2013  . Acute biliary pancreatitis 10/09/2013  . Endometrial adenocarcinoma ([place]) 03/16/2012    Juliann Pulse SPT 09/14/2019, 3:01 PM  [facility] [address] [place], [place], [address] Phone: [phone]   Fax:  [phone]  Name: JOYANNE BIEN MRN: [id number] Date of Birth: August 19, 1956

## 2019-09-14 NOTE — Patient Instructions (Addendum)
Gaze Stabilization - Tip Card -- DO LETTER EXERCISE AFTER DOING YOUR STRETCHES BELOW!  1.Target must remain in focus, not blurry, and appear stationary while head is in motion. 2.Perform exercises with small head movements (45 to either side of midline). 3.Increase speed of head motion so long as target is in focus. 4.If you wear eyeglasses, be sure you can see target through lens (therapist will give specific instructions for bifocal / progressive lenses). 5.These exercises may provoke dizziness or nausea. Work through these symptoms. If too dizzy, slow head movement slightly. Rest between each exercise. 6.Exercises demand concentration; avoid distractions. 7.For safety, perform standing exercises close to a counter, wall, corner, or next to someone.  Copyright  VHI. All rights reserved.   Gaze Stabilization - Standing Feet Apart   Position letter on wall 3 feet away, at nose level so that when you tilt your chin down the letter will be at eye level.   Stand with your back supported by a wall.   Feet shoulder width apart with a chair on your right side for support if needed, keeping eyes on target on wall 3 feet away, tilt head down slightly and move head side to side for 45 seconds.  Do 2-3 sessions per day, morning/lunch/evening. Keep your symptoms at mild, if they go up to moderate, stop and rest.      Flexibility: Upper Trapezius Stretch    Gently grasp right side of head while reaching behind back with other hand. Tilt head away until a gentle stretch is felt. Hold __30__ seconds. Repeat __2__ times per set.  Do __2-3_ sessions per day.      Begin sitting upright in a chair, placing a rolled towel just below the base of your skull. Gently pull the strap forward and up with both hands as you look up at the ceiling.  Hold for 3-4 seconds and then relax.  Perform 5 repetitions, 2 times a day.    Begin sitting in an upright position with a rolled towel around your neck.  Hold each end of the towel with your hands.  Pull down on towel with left hand, turn your head to the right and then pull the towel out and around with the right hand to turn your neck further.  Try not to put pressure on your jaw.  Hold for 3-4 seconds and then relax. Reverse to perform on other side.  Perform 3 repetitions on each side, 2 times a day.

## 2019-09-17 ENCOUNTER — Encounter: Payer: Self-pay | Admitting: Physical Therapy

## 2019-09-17 ENCOUNTER — Ambulatory Visit: Payer: Medicare Other | Admitting: Physical Therapy

## 2019-09-17 DIAGNOSIS — R42 Dizziness and giddiness: Secondary | ICD-10-CM | POA: Diagnosis not present

## 2019-09-17 DIAGNOSIS — R262 Difficulty in walking, not elsewhere classified: Secondary | ICD-10-CM

## 2019-09-17 DIAGNOSIS — R293 Abnormal posture: Secondary | ICD-10-CM

## 2019-09-17 DIAGNOSIS — R2681 Unsteadiness on feet: Secondary | ICD-10-CM

## 2019-09-17 DIAGNOSIS — M542 Cervicalgia: Secondary | ICD-10-CM

## 2019-09-17 NOTE — Therapy (Signed)
[facility] [address] [place], [place], [address] Phone: [phone]   Fax:  [phone]  Physical Therapy Treatment  Patient Details  Name: Raven Marks MRN: [id number] Date of Birth: 01/10/1956 Referring Provider (PT): Melida Quitter, MD   Encounter Date: 09/17/2019  PT End of Session - 09/17/19 1055    Visit Number  11    Number of Visits  16    Date for PT Re-Evaluation  0000000   per recert   Authorization Type  [employer] Medicare; $20 copay.  NO VL.  10th visit PN    PT Start Time  1100    PT Stop Time  1147    PT Time Calculation (min)  47 min    Activity Tolerance  Patient tolerated treatment well    Behavior During Therapy  WFL for tasks assessed/performed       Past Medical History:  Diagnosis Date  . Cancer of central portion of right female breast ([place]) 01/08/2016   stage 1A  . Complication of anesthesia    vertigo  . Congenital absence of femur    right  . Endometrioid adenocarcinoma    Stage IA, grade 2  . GERD (gastroesophageal reflux disease)   . History of external beam radiation therapy 10/27/16-12/24/16   right breast 50.4 Gy in 28 fracitons, boost 10 Gy in 5 fractions  . PONV (postoperative nausea and vomiting)   . 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 Spinal stenosis   . Vertigo    associated with anesthesia  . Vitamin D deficiency 09/2012    Past Surgical History:  Procedure Laterality Date  . CESAREAN SECTION    . CHOLECYSTECTOMY N/A 10/11/2013   Procedure: LAPAROSCOPIC CHOLECYSTECTOMY WITH INTRAOPERATIVE CHOLANGIOGRAM;  Surgeon: Joyice Faster. Cornett, MD;  Location: [facility];  Service: General;  Laterality: N/A;  . ERCP N/A 10/12/2013   Procedure: ENDOSCOPIC RETROGRADE CHOLANGIOPANCREATOGRAPHY (ERCP);  Surgeon: Missy Sabins, MD;  Location: [facility] ENDOSCOPY;  Service: Endoscopy;  Laterality: N/A;  . PARTIAL MASTECTOMY WITH AXILLARY SENTINEL LYMPH NODE BIOPSY  Right 03/09/16   at [facility]  . REPLACEMENT TOTAL KNEE Left 2010   Dr. Maureen Ralphs  . ROBOTIC ASSISTED TOTAL HYSTERECTOMY WITH BILATERAL SALPINGO OOPHERECTOMY  10/08/2010   Robotic, BSO, bilateral pelvic/ R periaortic LND    There were no vitals filed for this visit.  Subjective Assessment - 09/17/19 1055    Subjective  Patient states neck felt great after last session all weekend but had some tension this morning. Tried towel stretch over the weekend and it helped but she thinks it aggravated her neck today.    Pertinent History  vitamin D deficiency, spinal stenosis, RLE prosthesis due to birth defect - congenital absence of R femur, R breast CA treated with chemo and radiation, endometrioid adenocarcinoma, Left TKA and neck pain    Diagnostic tests  MRI: Moderate multilevel cervical disc and facet degeneration. No significant spinal stenosis.2. Mild-to-moderate multilevel neural foraminal stenosis as above                       [facility] Adult PT Treatment/Exercise - 09/17/19 1102      Neck Exercises: Seated   Other Seated Exercise  Reviewed Mulligan's stretch for cervical rotation, modified d/t requiring cues for technique. 2x30s each side.       Vestibular Treatment/Exercise - 09/17/19 1127      Vestibular Treatment/Exercise  Vestibular Treatment Provided  Gaze    Gaze Exercises  X1 Viewing Horizontal;X1 Viewing Vertical      X1 Viewing Horizontal   Foot Position  standing with back supported against walll, chair next to patient if needing to steady and SPC in R hand x2 reps, standing without back supported x1     Reps  3    Comments  x30s no sx, 2x60 with and without back supported no sx reported      X1 Viewing Vertical   Foot Position  standing with back supported against walll, chair next to patient if needing to steady and SPC in R hand x2 reps, standing without back supported x1     Reps  3    Comments  x30s mild dizziness, x60s, x45s stopped d/t cervical  tension when back unsupported          Balance Exercises - 09/17/19 1149      Balance Exercises: Standing   Gait with Head Turns  Forward;Retro;2 reps;1 rep   x2 fwd, x1 back; up/down, R/L, diagonals        PT Education - 09/17/19 1312    Education Details  Reviewed HEP d/t pt's next session not until the new year; discussed how future appts will be set up between therapists; educated pt on differences between neck stretches/exercises and importance for muscle length vs. joint mobility    Person(s) Educated  Patient    Methods  Explanation;Handout;Verbal cues    Comprehension  Verbalized understanding;Returned demonstration       PT Short Term Goals - 08/29/19 1037      PT SHORT TERM GOAL #1   Title  Pt will continue vestibular and cervical HEP and will initiate initial prosthetic HEP    Time  4    Period  Weeks    Status  Revised    Target Date  09/28/19      PT SHORT TERM GOAL #2   Title  Pt will demonstrate improved resting cervical posture positioned ot midline    Time  4    Period  Weeks    Status  Revised    Target Date  09/28/19      PT SHORT TERM GOAL #3   Title  --    Baseline  --    Time  --    Period  --    Status  --    Target Date  --      PT SHORT TERM GOAL #4   Title  Patient will tolerate wearing the new prosthesis >/= 2 hours 2x/day without skin integrity issues    Time  1    Period  Months    Status  New    Target Date  09/28/19      PT SHORT TERM GOAL #5   Title  Patient will verbalize understanding of proper prosthetic care utilization    Time  1    Period  Months    Status  New    Target Date  09/28/19        PT Long Term Goals - 08/29/19 0941      PT LONG TERM GOAL #1   Title  Pt will demonstrate indpendence with final vestibular and balance HEP  (new LTG due by 10/28/19)    Time  8    Period  Weeks    Status  Revised    Target Date  10/27/18      PT LONG TERM GOAL #  2   Title  Pt will report returning to walking on trails  and [place] with [place] with new prosthesis    Baseline  08/29/19: Pt reports walking on trails with husband at least 3x/week regularly with current prosthesis    Time  8    Period  Weeks    Status  Revised    Target Date  10/28/19      PT LONG TERM GOAL #3   Title  Pt will improve FGA score to >/= 22/30 with new prosthesis    Baseline  08/29/19: 19/30 with old prosthesis    Time  8    Period  Weeks    Status  Revised    Target Date  10/28/19      PT LONG TERM GOAL #4   Title  Pt will demonstrate 8-10 deg increase in cervical functional ROM for increased vestibular input    Baseline  08/29/19: pt demonstrated mild improvement in extension, R side bending & rotation and a 16 deg improvement in L rotation    Time  8    Period  Weeks    Status  On-going    Target Date  10/28/19      PT LONG TERM GOAL #5   Title  Pt will improve use of VOR as indicated by ability to perform x1 viewing horizontal and VOR cancellation for visual flow x 2 minutes without UE support in standing    Baseline  08/29/19: see VORx1 viewing comments, however pt is progressing VORx1 for HEP    Time  8    Period  Weeks    Status  Revised    Target Date  10/28/19      Additional Long Term Goals   Additional Long Term Goals  Yes      PT LONG TERM GOAL #6   Title  Patient will tolerate wearing new prosthesis >90% wake hours with no skin integrity issues of reports of pain    Time  8    Period  Weeks    Status  New    Target Date  10/28/19      PT LONG TERM GOAL #7   Title  Patient will demonstrate ability to donn/doff new prosthesis with supervision only    Time  8    Period  Weeks    Status  New    Target Date  10/28/19      PT LONG TERM GOAL #8   Title  Patient will demonstrate ability to negotiate ramp, curb, and stairs 1-railing with mod-I    Time  8    Period  Weeks    Status  New    Target Date  10/28/19      PT LONG TERM GOAL  #9   TITLE  Patient will ambulate 300' with LRAD and  new prosthesis mod-I on various terrain for improved community ambulation    Time  8    Period  Weeks    Status  New    Target Date  10/28/19            Plan - 09/17/19 1314    Clinical Impression Statement  Today's session focused on progressing gaze stabilization and vestibular exercises. Reviewed HEP stretch for cervical rotation mobility and modified based on pt's need for cues on technique. Pt tolerated increased time with x1 viewing exercise but demonstrated increased instability when removing back support in standing. Pt tolerated gait forward/retro with head turns in  all directions. She presented with slower gait speed primarily with retro gait d/t prosthesis and had more difficulty with head turns diagonally. Gait with environment scanning did not provoke dizziness. Pt can benefit from skilled therapy to address deficits and continue working towards functional goals.    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, R breast CA treated with chemo and radiation, RUE restricted, endometrioid adenocarcinoma, Left TKA and neck pain    Examination-Activity Limitations  Bed Mobility;Bend;Locomotion Level;Reach Overhead;Stand    Examination-Participation Restrictions  Cleaning;Community Activity    Stability/Clinical Decision Making  Stable/Uncomplicated    Rehab Potential  Good    PT Frequency  1x / week    PT Duration  8 weeks    PT Treatment/Interventions  ADLs/Self Care Home Management;Canalith Repostioning;Cryotherapy;Moist Heat;Traction;Gait training;Stair training;Functional mobility training;Therapeutic activities;Therapeutic exercise;Balance training;Neuromuscular re-education;Patient/family education;Manual techniques;Passive range of  motion;Vestibular;Taping;Prosthetic Training;DME Instruction    PT Next Visit Plan  PATIENT HAS 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  Assess BP on LUE - RUE restricted. New prosthesis evaluation and training.  STG due 1/1 but may need to be extended one week due to not being seen until 1/4.    Consulted and Agree with Plan of Care  Patient       Patient will benefit from skilled therapeutic intervention in order to improve the following deficits and impairments:  Decreased balance, Dizziness, Pain, Decreased range of motion, Difficulty walking, Abnormal gait, Decreased strength, Postural dysfunction  Visit Diagnosis: Dizziness and giddiness  Unsteadiness on feet  Difficulty in walking, not elsewhere classified  Abnormal posture  Cervicalgia     Problem List Patient Active Problem List   Diagnosis Date Noted  . Benign paroxysmal positional vertigo 04/20/2018  . Genetic testing 04/14/2016  . Malignant neoplasm of central portion of right female breast ([place]) 01/08/2016  . Cholelithiasis 10/13/2013  . Choledocholithiasis 10/13/2013  . Acute biliary pancreatitis 10/09/2013  . Endometrial adenocarcinoma ([place]) 03/16/2012    Juliann Pulse SPT 09/17/2019, 1:16 PM  [facility] [address] [place], [place], [address] Phone: [phone]   Fax:  [phone]  Name: Raven Marks MRN: [id number] Date of Birth: 06-May-1956

## 2019-09-17 NOTE — Patient Instructions (Signed)
Gaze Stabilization - Tip Card -- DO LETTER EXERCISE AFTER DOING YOUR STRETCHES BELOW!  1.Target must remain in focus, not blurry, and appear stationary while head is in motion. 2.Perform exercises with small head movements (45 to either side of midline). 3.Increase speed of head motion so long as target is in focus. 4.If you wear eyeglasses, be sure you can see target through lens (therapist will give specific instructions for bifocal / progressive lenses). 5.These exercises may provoke dizziness or nausea. Work through these symptoms. If too dizzy, slow head movement slightly. Rest between each exercise. 6.Exercises demand concentration; avoid distractions. 7.For safety, perform standing exercises close to a counter, wall, corner, or next to someone.  Copyright  VHI. All rights reserved.   Gaze Stabilization - Standing Feet Apart   Position letter on wall 3 feet away, at nose level so that when you tilt your chin down the letter will be at eye level.   Stand with your back supported by a wall.   Feet shoulder width apart with a chair on your right side for support if needed, keeping eyes on target on wall 3 feet away, tilt head down slightly and move head side to side for 60 seconds.  Rest and then repeat moving head up and down for 60 seconds. Do 2-3 sessions per day, morning/lunch/evening. Keep your symptoms at mild, if they go up to moderate, stop and rest.      Flexibility: Upper Trapezius Stretch    Gently grasp right side of head while reaching behind back with other hand. Tilt head away until a gentle stretch is felt. Hold __30__ seconds. Repeat __2__ times per set.  Do __2-3_ sessions per day.      Begin sitting upright in a chair, placing a rolled towel just below the base of your skull. Gently pull the strap forward and up with both hands as you look up at the ceiling.  Hold for 3-4 seconds and then relax.  Perform 5 repetitions, 2 times a day.    Begin sitting  in an upright position with a rolled towel around your neck. Hold each end of the towel with your hands.  Pull down on towel with left hand, turn your head to the right and then pull the towel out and around with the right hand to turn your neck further.  Try not to put pressure on your jaw.  Hold for 3-4 seconds and then relax. Reverse to perform on other side.  Perform 3 repetitions on each side, 2 times a day.

## 2019-10-01 ENCOUNTER — Encounter: Payer: Self-pay | Admitting: Physical Therapy

## 2019-10-01 ENCOUNTER — Ambulatory Visit: Payer: Medicare PPO | Attending: Otolaryngology | Admitting: Physical Therapy

## 2019-10-01 ENCOUNTER — Other Ambulatory Visit: Payer: Self-pay

## 2019-10-01 ENCOUNTER — Ambulatory Visit: Payer: Medicare PPO | Admitting: Physical Therapy

## 2019-10-01 DIAGNOSIS — R262 Difficulty in walking, not elsewhere classified: Secondary | ICD-10-CM | POA: Insufficient documentation

## 2019-10-01 DIAGNOSIS — R42 Dizziness and giddiness: Secondary | ICD-10-CM | POA: Diagnosis present

## 2019-10-01 DIAGNOSIS — R2681 Unsteadiness on feet: Secondary | ICD-10-CM | POA: Insufficient documentation

## 2019-10-01 DIAGNOSIS — M6281 Muscle weakness (generalized): Secondary | ICD-10-CM | POA: Insufficient documentation

## 2019-10-01 DIAGNOSIS — R293 Abnormal posture: Secondary | ICD-10-CM | POA: Diagnosis present

## 2019-10-01 DIAGNOSIS — R2689 Other abnormalities of gait and mobility: Secondary | ICD-10-CM | POA: Insufficient documentation

## 2019-10-01 NOTE — Therapy (Signed)
[facility] [address] [place], [place], [address] Phone: [phone]   Fax:  [phone]  Physical Therapy Treatment  Patient Details  Name: Raven Marks MRN: [id number] Date of Birth: 1956/02/23 Referring Provider (PT): Melida Quitter, MD   Encounter Date: 10/01/2019  PT End of Session - 10/01/19 1344    Visit Number  12    Number of Visits  16    Date for PT Re-Evaluation  0000000   per recert   Authorization Type  [employer] Medicare; $20 copay.  NO VL.  10th visit PN    PT Start Time  1100    PT Stop Time  1150    PT Time Calculation (min)  50 min    Equipment Utilized During Treatment  Gait belt    Activity Tolerance  Patient tolerated treatment well    Behavior During Therapy  WFL for tasks assessed/performed       Past Medical History:  Diagnosis Date  . Cancer of central portion of right female breast ([place]) 01/08/2016   stage 1A  . Complication of anesthesia    vertigo  . Congenital absence of femur    right  . Endometrioid adenocarcinoma    Stage IA, grade 2  . GERD (gastroesophageal reflux disease)   . History of external beam radiation therapy 10/27/16-12/24/16   right breast 50.4 Gy in 28 fracitons, boost 10 Gy in 5 fractions  . PONV (postoperative nausea and vomiting)   . 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 Spinal stenosis   . Vertigo    associated with anesthesia  . Vitamin D deficiency 09/2012    Past Surgical History:  Procedure Laterality Date  . CESAREAN SECTION    . CHOLECYSTECTOMY N/A 10/11/2013   Procedure: LAPAROSCOPIC CHOLECYSTECTOMY WITH INTRAOPERATIVE CHOLANGIOGRAM;  Surgeon: Joyice Faster. Cornett, MD;  Location: [facility];  Service: General;  Laterality: N/A;  . ERCP N/A 10/12/2013   Procedure: ENDOSCOPIC RETROGRADE CHOLANGIOPANCREATOGRAPHY (ERCP);  Surgeon: Missy Sabins, MD;  Location: [facility] ENDOSCOPY;  Service: Endoscopy;  Laterality: N/A;  . PARTIAL  MASTECTOMY WITH AXILLARY SENTINEL LYMPH NODE BIOPSY Right 03/09/16   at [facility]  . REPLACEMENT TOTAL KNEE Left 2010   Dr. Maureen Ralphs  . ROBOTIC ASSISTED TOTAL HYSTERECTOMY WITH BILATERAL SALPINGO OOPHERECTOMY  10/08/2010   Robotic, BSO, bilateral pelvic/ R periaortic LND    There were no vitals filed for this visit.  Subjective Assessment - 10/01/19 1100    Subjective  She got new prosthesis from [employer] in [place] ~2 years ago. At that time she was going thru Chemo & radiation for Breast Cancer for next 6-8 months. At that time she attempted to use new prosthesis for couple of days but her back & hip went out. Tried again but same thing. She has not tried for last year. She got vertigo.  No falls. 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 She was using same design prosthesis until this most recent prosthesis. The "old prosthesis" is 66 years old.    Pertinent History  vitamin D deficiency, spinal stenosis, RLE prosthesis due to birth defect - congenital absence of R femur, R breast CA treated with chemo and radiation, endometrioid adenocarcinoma, Left TKA and neck pain    Diagnostic tests  MRI: Moderate multilevel cervical disc and facet degeneration. No significant spinal stenosis.2. Mild-to-moderate multilevel neural foraminal stenosis as above    Currently in Pain?  No/denies  [facility] PT Assessment - 10/01/19 1100      Assessment   Medical Diagnosis  Dizziness, R Transfemoral amputation    Referring Provider (PT)  Melida Quitter, MD      [place] residence    Living Arrangements  Spouse/significant other    Type of [place]  Two level    [place] - single point;Crutches   axillary crutches     Prior Function   Level of Independence  Independent;Independent with household mobility with device;Independent with community mobility with device   uses cane in home & community     Posture/Postural Control    Posture/Postural Control  Postural limitations    Postural Limitations  Rounded Shoulders;Forward head;Weight shift left    Posture Comments  with old prosthesis: occipital is 4-5" to left of midline. With new prosthesis: occipital is 2-3" left of midline.       ROM / Strength   AROM / PROM / Strength  AROM      AROM   Overall AROM   Deficits    Overall AROM Comments  right hip resting: 21* flexion (excessive lordosis so limb appears to be neutral & 4* of adduction.  AROM right hip extension -19* (only 2* of actual hip motion) and abduction 12* (16* of motion from resting position).  Most of "hip" motion comes from lumbar area moving pelvis not hip.       Transfers   Transfers  Sit to Stand;Stand to Sit    Sit to Stand  5: Supervision;With upper extremity assist;With armrests;From chair/3-in-1    Sit to Stand Details (indicate cue type and reason)  Increased lumbar motion to stabilize prosthetic knee on new prosthesis (multiaxial / 4-bar prosthetic knee) as operates differently than old prosthesis with single axis knee    Stand to Sit  5: Supervision;With upper extremity assist;With armrests;To chair/3-in-1    Stand to Sit Details  Increased lumbar motion to stabilize prosthetic knee on new prosthesis (multiaxial / 4-bar prosthetic knee) as operates differently than old prosthesis with single axis knee      Ambulation/Gait   Ambulation/Gait  Yes    Ambulation/Gait Assistance  4: Min assist;5: Supervision   new prosthesis: supervision RW & minA cane   Ambulation Distance (Feet)  150 Feet   48' with RW & 29' with cane/HHA then 10' cane without HHA   Assistive device  Prosthesis;Rolling walker;Straight cane    Gait Pattern  Step-to pattern;Decreased arm swing - right;Decreased step length - right;Decreased stance time - right;Decreased hip/knee flexion - right;Decreased weight shift to right;Right hip hike;Antalgic;Lateral hip instability;Abducted- right   prosthetic initial contact heel even  with LLE MTPs   Ambulation Surface  Indoor;Level      Balance   Balance Assessed  Yes      Static Standing Balance   Static Standing - Balance Support  No upper extremity supported    Static Standing - Level of Assistance  5: Stand by assistance    Static Standing - Comment/# of Minutes  static stance >72min without UE support      Dynamic Standing Balance   Dynamic Standing - Balance Support  No upper extremity supported    Dynamic Standing - Level of Assistance  5: Stand by assistance    Dynamic Standing - Balance Activities  Eyes opn;Head nods;Head turns;Reaching for objects    Dynamic Standing - Comments  static with eyes  closed 5sec with supervision.  Scans looking right/left & up/down with cervical motion only.  reaches forward with dominant RUE 4" with close supervision.  Reaches towards floor with single UE support on RW with supervision & without UE support with minA.       Prosthetics Assessment - 10/01/19 1100      Prosthetics   Donning prosthesis   Modified independent (Device/Increase time)    Doffing prosthesis   Modified independent (Device/Increase time)    Current prosthetic wear tolerance (days/week)   old prosthesis daily.  New prosthesis none in last year.     Current prosthetic wear tolerance (#hours/day)   old prosthesis most of awake hours (donnes after am shower & doffes when getting ready for bed).  New prosthesis: no wear.    Current prosthetic weight-bearing tolerance (hours/day)   Patient tolerated 5 minutes of standing balance testing, seated rest, then <5 minutes of gait assessment with no reports of back or hip pain.     Edema  none    Residual limb condition   limb is rotationplasty without ankle /knee joint.  Distal limb is heel pad & plantar skin tissue with no issues. Distal 2/3 is minimal soft tissue over "femur" (probably actually tibia from rotationplasty), proximal 1/3 of limb is normal hip soft tissue / muscles.  No open areas. normal skin color &  temperature.     Prosthesis Description  Old Prosthesis is K2 (basic community fixed cadence) exoskeletal, ischial containment socket with selesian belt, single axis non-hydraulic knee, single axis foot with stiff bumpers.  New prosthesis is K3 (full community with variable cadence) endoskeletal with ischial containment socket (pt reports was given flexible inner liner if she wanted to use but never did) with selesian belt, mulit-axial (4-bar) pneumatic swing knee and Flex Walk with multiaxial rotation linkage foot.  [employer] said was delivered 09/25/2014.       K code/activity level with prosthetic use   K3 full community with variable cadence.                   [facility] Adult PT Treatment/Exercise - 10/01/19 1100      Prosthetics   Prosthetic Care Comments   wear new prosthesis 1hr daily sitting only for now.     Education Provided  Proper wear schedule/adjustment    Person(s) Educated  Patient;Spouse    Education Method  Explanation;Verbal cues    Education Method  Verbalized understanding             PT Education - 10/01/19 1150    Education Details  prosthetic PT plan to build up wear & function with "new" prosthesis    Person(s) Educated  Patient;Spouse    Methods  Explanation;Verbal cues    Comprehension  Verbalized understanding;Need further instruction       PT Short Term Goals - 08/29/19 1037      PT SHORT TERM GOAL #1   Title  Pt will continue vestibular and cervical HEP and will initiate initial prosthetic HEP    Time  4    Period  Weeks    Status  Revised    Target Date  09/28/19      PT SHORT TERM GOAL #2   Title  Pt will demonstrate improved resting cervical posture positioned ot midline    Time  4    Period  Weeks    Status  Revised    Target Date  09/28/19      PT SHORT  TERM GOAL #3   Title  --    Baseline  --    Time  --    Period  --    Status  --    Target Date  --      PT SHORT TERM GOAL #4   Title  Patient will tolerate wearing  the new prosthesis >/= 2 hours 2x/day without skin integrity issues    Time  1    Period  Months    Status  New    Target Date  09/28/19      PT SHORT TERM GOAL #5   Title  Patient will verbalize understanding of proper prosthetic care utilization    Time  1    Period  Months    Status  New    Target Date  09/28/19        PT Long Term Goals - 08/29/19 0941      PT LONG TERM GOAL #1   Title  Pt will demonstrate indpendence with final vestibular and balance HEP  (new LTG due by 10/28/19)    Time  8    Period  Weeks    Status  Revised    Target Date  10/27/18      PT LONG TERM GOAL #2   Title  Pt will report returning to walking on trails and [place] with husband with new prosthesis    Baseline  08/29/19: Pt reports walking on trails with husband at least 3x/week regularly with current prosthesis    Time  8    Period  Weeks    Status  Revised    Target Date  10/28/19      PT LONG TERM GOAL #3   Title  Pt will improve FGA score to >/= 22/30 with new prosthesis    Baseline  08/29/19: 19/30 with old prosthesis    Time  8    Period  Weeks    Status  Revised    Target Date  10/28/19      PT LONG TERM GOAL #4   Title  Pt will demonstrate 8-10 deg increase in cervical functional ROM for increased vestibular input    Baseline  08/29/19: pt demonstrated mild improvement in extension, R side bending & rotation and a 16 deg improvement in L rotation    Time  8    Period  Weeks    Status  On-going    Target Date  10/28/19      PT LONG TERM GOAL #5   Title  Pt will improve use of VOR as indicated by ability to perform x1 viewing horizontal and VOR cancellation for visual flow x 2 minutes without UE support in standing    Baseline  08/29/19: see VORx1 viewing comments, however pt is progressing VORx1 for HEP    Time  8    Period  Weeks    Status  Revised    Target Date  10/28/19      Additional Long Term Goals   Additional Long Term Goals  Yes      PT LONG TERM GOAL  #6   Title  Patient will tolerate wearing new prosthesis >90% wake hours with no skin integrity issues of reports of pain    Time  8    Period  Weeks    Status  New    Target Date  10/28/19      PT LONG TERM GOAL #7   Title  Patient  will demonstrate ability to donn/doff new prosthesis with supervision only    Time  8    Period  Weeks    Status  New    Target Date  10/28/19      PT LONG TERM GOAL #8   Title  Patient will demonstrate ability to negotiate ramp, curb, and stairs 1-railing with mod-I    Time  8    Period  Weeks    Status  New    Target Date  10/28/19      PT LONG TERM GOAL  #9   TITLE  Patient will ambulate 300' with LRAD and new prosthesis mod-I on various terrain for improved community ambulation    Time  8    Period  Weeks    Status  New    Target Date  10/28/19            Plan - 10/01/19 1346    Clinical Impression Statement  Today's PT session was with prosthetic PT specialist.  Her "new" prosthesis that she reported getting ~19yrs ago but prosthetic company reports delivery date 09/25/2014. Her prosthesis since ~1961 has been an exoskeletal K2 non-hydraulic single axis knee and single axis foot.  Her "new" prosthesis is endoskeletal K3 pneumatic swing 4-Bar (multiaxial) and flex motion foot with multiaxial rotation.  She reports that she attempted to use the new prosthesis when it was first delivered with no PT or training. She did not trust it or feel comfortable with it. It also increased her back/right hip pain.  Patient wants to attempt to use the "new" prosthesis with help from prosthetic PT specialist. She has not worn the "new" prosthesis for greater than a year. Standing balance & gait are impaired compared to "old" prosthesis. See PT objective findings on 10/01/2019 for details.  Patient would benefit from skilled prosthetic PT to improve function and safety with "new" prosthesis.  She will probably need longer PT beyond current plan that ends 10/28/2019.     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, R breast CA treated with chemo and radiation, RUE restricted, endometrioid adenocarcinoma, Left TKA and neck pain    Examination-Activity Limitations  Bed Mobility;Bend;Locomotion Level;Reach Overhead;Stand    Examination-Participation Restrictions  Cleaning;Community Activity    Stability/Clinical Decision Making  Stable/Uncomplicated    Rehab Potential  Good    PT Frequency  1x / week    PT Duration  8 weeks    PT Treatment/Interventions  ADLs/Self Care Home Management;[place] Repostioning;Cryotherapy;Moist Heat;Traction;Gait training;Stair training;Functional mobility training;Therapeutic activities;Therapeutic exercise;Balance training;Neuromuscular re-education;Patient/family education;Manual techniques;Passive range of motion;Vestibular;Taping;Prosthetic Training;DME Instruction    PT Next Visit Plan  STG due 1/1 need to be extended one week due to not being seen until 1/4.  prosthetic plan is HEP at counter/sink then progress to prosthetic gait with axillary crutches    Recommended Other Services  PATIENT HAS 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  if need BP use LUE - RUE restricted.    Consulted and Agree with Plan of Care  Patient       Patient will benefit from skilled therapeutic intervention in order to improve the following deficits and impairments:  Decreased balance, Dizziness, Pain, Decreased range of motion, Difficulty walking, Abnormal gait, Decreased strength, Postural dysfunction  Visit Diagnosis:  Other abnormalities of gait and mobility  Muscle weakness     Problem List Patient Active Problem List   Diagnosis Date Noted  . Benign paroxysmal  positional vertigo 04/20/2018  . Genetic testing 04/14/2016  . Malignant neoplasm of central portion of right female breast ([place]) 01/08/2016  . Cholelithiasis 10/13/2013  . Choledocholithiasis 10/13/2013  . Acute biliary pancreatitis 10/09/2013  . Endometrial adenocarcinoma ([place]) 03/16/2012    Jamey Reas PT, DPT 10/01/2019, 2:00 PM  [facility] [address] [place], [place], [address] Phone: [phone]   Fax:  [phone]  Name: Raven Marks MRN: [id number] Date of Birth: 09-28-1955

## 2019-10-08 ENCOUNTER — Ambulatory Visit: Payer: Medicare PPO | Admitting: Physical Therapy

## 2019-10-08 ENCOUNTER — Encounter: Payer: Self-pay | Admitting: Physical Therapy

## 2019-10-08 ENCOUNTER — Other Ambulatory Visit: Payer: Self-pay

## 2019-10-08 DIAGNOSIS — R293 Abnormal posture: Secondary | ICD-10-CM

## 2019-10-08 DIAGNOSIS — R2681 Unsteadiness on feet: Secondary | ICD-10-CM

## 2019-10-08 DIAGNOSIS — R2689 Other abnormalities of gait and mobility: Secondary | ICD-10-CM

## 2019-10-08 DIAGNOSIS — R42 Dizziness and giddiness: Secondary | ICD-10-CM

## 2019-10-08 DIAGNOSIS — R262 Difficulty in walking, not elsewhere classified: Secondary | ICD-10-CM

## 2019-10-08 NOTE — Patient Instructions (Addendum)
Do each exercise 1  time per day Do each exercise 5 repetitions Hold each exercise for 2 seconds to feel your location  AT [place].  Try to find this position when standing still for activities.   USE TAPE ON FLOOR TO MARK THE MIDLINE POSITION. You also should try to feel with your limb pressure in socket.  You are trying to feel with limb what you used to feel with the bottom of your foot.  1. Side to Side Shift: Moving your hips only (not shoulders): move weight onto your left leg, HOLD/FEEL.  Move back to equal weight on each leg, HOLD/FEEL. Move weight onto your right leg, HOLD/FEEL. Move back to equal weight on each leg, HOLD/FEEL. Repeat.  Start with both hands on sink. 2. Front to Back Shift: Moving your hips only (not shoulders): move your weight forward onto your toes, HOLD/FEEL. Move your weight back to equal Flat Foot on both legs, HOLD/FEEL. Move your weight back onto your heels, HOLD/FEEL. Move your weight back to equal on both legs, HOLD/FEEL. Repeat.  Start with both hands on sink.

## 2019-10-08 NOTE — Therapy (Signed)
[facility] [address] [place], [place], [address] Phone: [phone]   Fax:  [phone]  Physical Therapy Treatment  Patient Details  Name: Raven Marks MRN: [id number] Date of Birth: July 14, 1956 Referring Provider (PT): Melida Quitter, MD   Encounter Date: 10/08/2019  PT End of Session - 10/08/19 1357    Visit Number  13    Number of Visits  16    Date for PT Re-Evaluation  21/22/48   per recert   Authorization Type  [employer] Medicare; $20 copay.  NO VL.  10th visit PN    PT Start Time  1405    PT Stop Time  1510    PT Time Calculation (min)  65 min    Equipment Utilized During Treatment  Gait belt    Activity Tolerance  Patient tolerated treatment well    Behavior During Therapy  WFL for tasks assessed/performed       Past Medical History:  Diagnosis Date  . Cancer of central portion of right female breast ([place]) 01/08/2016   stage 1A  . Complication of anesthesia    vertigo  . Congenital absence of femur    right  . Endometrioid adenocarcinoma    Stage IA, grade 2  . GERD (gastroesophageal reflux disease)   . History of external beam radiation therapy 10/27/16-12/24/16   right breast 50.4 Gy in 28 fracitons, boost 10 Gy in 5 fractions  . PONV (postoperative nausea and vomiting)   . 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 Spinal stenosis   . Vertigo    associated with anesthesia  . Vitamin D deficiency 09/2012    Past Surgical History:  Procedure Laterality Date  . CESAREAN SECTION    . CHOLECYSTECTOMY N/A 10/11/2013   Procedure: LAPAROSCOPIC CHOLECYSTECTOMY WITH INTRAOPERATIVE CHOLANGIOGRAM;  Surgeon: Joyice Faster. Cornett, MD;  Location: [facility];  Service: General;  Laterality: N/A;  . ERCP N/A 10/12/2013   Procedure: ENDOSCOPIC RETROGRADE CHOLANGIOPANCREATOGRAPHY (ERCP);  Surgeon: Missy Sabins, MD;  Location: [facility] ENDOSCOPY;  Service: Endoscopy;  Laterality: N/A;  . PARTIAL  MASTECTOMY WITH AXILLARY SENTINEL LYMPH NODE BIOPSY Right 03/09/16   at [facility]  . REPLACEMENT TOTAL KNEE Left 2010   Dr. Maureen Ralphs  . ROBOTIC ASSISTED TOTAL HYSTERECTOMY WITH BILATERAL SALPINGO OOPHERECTOMY  10/08/2010   Robotic, BSO, bilateral pelvic/ R periaortic LND    There were no vitals filed for this visit.  Subjective Assessment - 10/08/19 1357    Subjective  Pt stated wearing prosthesis for 1 hour/daily while sitting except for 1 day since last PT session. Was not comfortable d/t socket pressure on ischial tuberosity d/t ischial containment socket design compared to previous prosthetic socket that was more similar to a quad socket.    Pertinent History  vitamin D deficiency, spinal stenosis, RLE prosthesis due to birth defect - congenital absence of R femur, R breast CA treated with chemo and radiation, endometrioid adenocarcinoma, Left TKA and neck pain    Diagnostic tests  MRI: Moderate multilevel cervical disc and facet degeneration. No significant spinal stenosis.2. Mild-to-moderate multilevel neural foraminal stenosis as above    Currently in Pain?  No/denies                       [facility] Adult PT Treatment/Exercise - 10/08/19 1508      Transfers   Transfers  Sit to Stand;Stand to Sit    Sit to  Stand  5: Supervision;With upper extremity assist;With armrests;From chair/3-in-1    Stand to Sit  5: Supervision;With upper extremity assist;With armrests;To chair/3-in-1      Ambulation/Gait   Ambulation/Gait  --    Ambulation/Gait Assistance  --    Ambulation Distance (Feet)  --    Assistive device  --    Gait Pattern  --      Posture/Postural Control   Posture/Postural Control  Postural limitations    Postural Limitations  Rounded Shoulders;Forward head;Weight shift left    Posture Comments  with old prosthesis: occipital is 4-5" to left of midline. With new prosthesis: occipital is 2-3" left of midline. -- improved midline sitting posture & cervical  resting position when wearing new prosthesis, wears new prosthesis limited to 1 hour daily in sitting at this time until tolerance improves       Therapeutic Activites    Therapeutic Activities  Other Therapeutic Activities    Other Therapeutic Activities  Had pt use towel roll under L ischial tuberosity in sitting to level pelvis for even wt distribrution and dec stress on low back. Re-educated pt to use towel roll at home when seated for improved midline posture.       Neck Exercises: Seated   Other Seated Exercise  Reviewed Mulligan's stretch for cervical rotation, modified d/t requiring cues for technique. 2x30s each side.       Prosthetics   Prosthetic Care Comments   continue to wear new prosthesis for 1 hour daily when seated and during wt shifting exercises at sink. Pt found sitting with new prosthesis on to be uncomfortable - measured medial brim height and socket depth of new & old prosthesis. New prosthesis: medial brim = 30 3/8th", socket depth = 10". Old prosthesis: medial brim = 30" and socket depth = 9 5/8ths". PT talked to prosthestist about setting up appt with pt to alter new prosthetic socket to resemble old prosthetic socket or to add pads to decrease socket depth & to recess inner socket bolts for strap to dec pressure .     Current prosthetic wear tolerance (days/week)   old prosthesis daily.  New prosthesis none in last year.     Current prosthetic wear tolerance (#hours/day)   old prosthesis most of awake hours (donnes after am shower & doffes when getting ready for bed).  New prosthesis: 1 hour daily when sitting except for 1 day since last session     Current prosthetic weight-bearing tolerance (hours/day)   Patient tolerated 2 bouts of 5-10 minutes of standing balance at RW.     Edema  none    Residual limb condition   limb is rotationplasty without ankle /knee joint.  Distal limb is heel pad & plantar skin tissue with no issues. Distal 2/3 is minimal soft tissue over  "femur" (probably actually tibia from rotationplasty), proximal 1/3 of limb is normal hip soft tissue / muscles.  No open areas. normal skin color & temperature.     Education Provided  Proper wear schedule/adjustment    Person(s) Educated  Patient;Spouse    Education Method  Explanation;Verbal cues    Education Method  Verbalized understanding         Do each exercise 1  time per day Do each exercise 5 repetitions Hold each exercise for 2 seconds to feel your location  AT [place].  Try to find this position when standing still for activities.  USE TAPE ON FLOOR TO MARK THE MIDLINE POSITION. You also should try to feel with your limb pressure in socket.  You are trying to feel with limb what you used to feel with the bottom of your foot.  1. Side to Side Shift: Moving your hips only (not shoulders): move weight onto your left leg, HOLD/FEEL.  Move back to equal weight on each leg, HOLD/FEEL. Move weight onto your right leg, HOLD/FEEL. Move back to equal weight on each leg, HOLD/FEEL. Repeat.  Start with both hands on sink. 2. Front to Back Shift: Moving your hips only (not shoulders): move your weight forward onto your toes, HOLD/FEEL. Move your weight back to equal Flat Foot on both legs, HOLD/FEEL. Move your weight back onto your heels, HOLD/FEEL. Move your weight back to equal on both legs, HOLD/FEEL. Repeat.  Start with both hands on sink.   Performed in front of RW during session with bilateral UE support. Pt required verbal and tactile cues for correct technique throughout exercise. Pt had 1/2" heel wedge under L foot to level pelvis with all standing activities.    PT Education - 10/08/19 1455    Education Details  Initiated weight shifting at sink HEP; see pt instructions    Person(s) Educated  Patient    Methods  Explanation;Handout;Verbal cues;Tactile cues;Demonstration    Comprehension  Verbalized  understanding;Returned demonstration;Verbal cues required;Need further instruction       PT Short Term Goals - 10/08/19 1500      PT SHORT TERM GOAL #1   Title  Pt will continue vestibular and cervical HEP and will initiate initial prosthetic HEP    Baseline  10/08/2019: independent with cervical/vestibular HEP, initiated initial prosthetic HEP today - dependent at this time    Time  4    Period  Weeks    Status  Partially Met    Target Date  09/28/19      PT SHORT TERM GOAL #2   Title  Pt will demonstrate improved resting cervical posture positioned ot midline    Baseline  10/08/2019: resting posture position has remained unchanged at this time - may change when beginning to wear new prosthesis more consistently    Time  4    Period  Weeks    Status  Not Met    Target Date  09/28/19      PT SHORT TERM GOAL #4   Title  Patient will tolerate wearing the new prosthesis >/= 2 hours 2x/day without skin integrity issues    Baseline  10/08/2019: currently able to tolerate wearing new prosthesis 1 hour daily when seated - some discomfort, no skin integrity issues    Time  1    Period  Months    Status  Not Met    Target Date  09/28/19      PT SHORT TERM GOAL #5   Title  Patient will verbalize understanding of proper prosthetic care utilization    Baseline  10/08/2019: independent with old prosthesis, dependent with new prosthesis at this current time    Time  1    Period  Months    Status  Not Met    Target Date  09/28/19        PT Long Term Goals - 08/29/19 0941      PT LONG TERM GOAL #1   Title  Pt will demonstrate indpendence with final vestibular and balance HEP  (new LTG due by 10/28/19)    Time  8  Period  Weeks    Status  Revised    Target Date  10/27/18      PT LONG TERM GOAL #2   Title  Pt will report returning to walking [place] trails and [place] with [place] with new prosthesis    Baseline  08/29/19: Pt reports walking on trails with husband at least  3x/week regularly with current prosthesis    Time  8    Period  Weeks    Status  Revised    Target Date  10/28/19      PT LONG TERM GOAL #3   Title  Pt will improve FGA score to >/= 22/30 with new prosthesis    Baseline  08/29/19: 19/30 with old prosthesis    Time  8    Period  Weeks    Status  Revised    Target Date  10/28/19      PT LONG TERM GOAL #4   Title  Pt will demonstrate 8-10 deg increase in cervical functional ROM for increased vestibular input    Baseline  08/29/19: pt demonstrated mild improvement in extension, R side bending & rotation and a 16 deg improvement in L rotation    Time  8    Period  Weeks    Status  On-going    Target Date  10/28/19      PT LONG TERM GOAL #5   Title  Pt will improve use of VOR as indicated by ability to perform x1 viewing horizontal and VOR cancellation for visual flow x 2 minutes without UE support in standing    Baseline  08/29/19: see VORx1 viewing comments, however pt is progressing VORx1 for HEP    Time  8    Period  Weeks    Status  Revised    Target Date  10/28/19      Additional Long Term Goals   Additional Long Term Goals  Yes      PT LONG TERM GOAL #6   Title  Patient will tolerate wearing new prosthesis >90% wake hours with no skin integrity issues of reports of pain    Time  8    Period  Weeks    Status  New    Target Date  10/28/19      PT LONG TERM GOAL #7   Title  Patient will demonstrate ability to donn/doff new prosthesis with supervision only    Time  8    Period  Weeks    Status  New    Target Date  10/28/19      PT LONG TERM GOAL #8   Title  Patient will demonstrate ability to negotiate ramp, curb, and stairs 1-railing with mod-I    Time  8    Period  Weeks    Status  New    Target Date  10/28/19      PT LONG TERM GOAL  #9   TITLE  Patient will ambulate 300' with LRAD and new prosthesis mod-I on various terrain for improved community ambulation    Time  8    Period  Weeks    Status  New    Target  Date  10/28/19            Plan - 10/08/19 1357    Clinical Impression Statement  PT session focused on STG assessment and initiation of weight shifting HEP with new prosthesis. Pt did partially met 1/4 STG and did not meet the remaining 3.  She is independent with cervical/vestibular HEP but has not started a prosthetic HEP -- prosthetic HEP initiated at today's session. Pt tolerates wear of new prosthesis for 1 hour daily in sitting but experiences discomfort - discussed with prosthetist socket features that could be altered to mimic old prosthetic socket and increase tolerance to new prosthesis.  Pt tolerated standing wt shifting activities requiring significant cueing for positioning and technique for desired outcome. She is dependent with the new prosthesis at this time d/t just starting to wear and learn about new prosthesis but will continue to progress knowledge and abilities with skilled physical therapy services.    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, R breast CA treated with chemo and radiation, RUE restricted, endometrioid adenocarcinoma, Left TKA and neck pain    Examination-Activity Limitations  Bed Mobility;Bend;Locomotion Level;Reach Overhead;Stand    Examination-Participation Restrictions  Cleaning;Community Activity    Stability/Clinical Decision Making  Stable/Uncomplicated    Rehab Potential  Good    PT Frequency  1x / week    PT Duration  8 weeks    PT Treatment/Interventions  ADLs/Self Care Home Management;[place] Repostioning;Cryotherapy;Moist Heat;Traction;Gait training;Stair training;Functional mobility training;Therapeutic activities;Therapeutic exercise;Balance training;Neuromuscular re-education;Patient/family education;Manual techniques;Passive  range of motion;Vestibular;Taping;Prosthetic Training;DME Instruction    PT Next Visit Plan  continue prosthetic plan is HEP at counter/sink then progress to prosthetic gait with axillary crutches    Consulted and Agree with Plan of Care  Patient       Patient will benefit from skilled therapeutic intervention in order to improve the following deficits and impairments:  Decreased balance, Dizziness, Pain, Decreased range of motion, Difficulty walking, Abnormal gait, Decreased strength, Postural dysfunction  Visit Diagnosis: Other abnormalities of gait and mobility  Unsteadiness on feet  Difficulty in walking, not elsewhere classified  Abnormal posture  Dizziness and giddiness     Problem List Patient Active Problem List   Diagnosis Date Noted  . Benign paroxysmal positional vertigo 04/20/2018  . Genetic testing 04/14/2016  . Malignant neoplasm of central portion of right female breast ([place]) 01/08/2016  . Cholelithiasis 10/13/2013  . Choledocholithiasis 10/13/2013  . Acute biliary pancreatitis 10/09/2013  . Endometrial adenocarcinoma ([place]) 03/16/2012   Juliann Pulse SPT 10/08/2019, 3:18 PM  Jamey Reas PT, DPT 10/08/2019, 10:42 PM  [facility] [address] [place], [place], [address] Phone: [phone]   Fax:  [phone]  Name: Raven Marks MRN: [id number] Date of Birth: 30-Jan-1956

## 2019-10-15 ENCOUNTER — Other Ambulatory Visit: Payer: Self-pay

## 2019-10-15 ENCOUNTER — Encounter: Payer: Self-pay | Admitting: Physical Therapy

## 2019-10-15 ENCOUNTER — Ambulatory Visit: Payer: Medicare PPO | Admitting: Physical Therapy

## 2019-10-15 DIAGNOSIS — R262 Difficulty in walking, not elsewhere classified: Secondary | ICD-10-CM

## 2019-10-15 DIAGNOSIS — R2681 Unsteadiness on feet: Secondary | ICD-10-CM

## 2019-10-15 DIAGNOSIS — R2689 Other abnormalities of gait and mobility: Secondary | ICD-10-CM | POA: Diagnosis not present

## 2019-10-15 DIAGNOSIS — R293 Abnormal posture: Secondary | ICD-10-CM

## 2019-10-15 NOTE — Patient Instructions (Addendum)
Do each exercise 1-2  times per day Do each exercise 10 repetitions Hold each exercise for 2 seconds to feel your location  AT [place].  Try to find this position when standing still for activities.   USE TAPE ON FLOOR TO MARK THE MIDLINE POSITION. You also should try to feel with your limb pressure in socket.  You are trying to feel with limb what you used to feel with the bottom of your foot.  1. Side to Side Shift: Moving your hips only (not shoulders): move weight onto your left leg, HOLD/FEEL.  Move back to equal weight on each leg, HOLD/FEEL. Move weight onto your right leg, HOLD/FEEL. Move back to equal weight on each leg, HOLD/FEEL. Repeat.  Start with both hands on sink, progress to right hand only, then no hands.  2. Front to Back Shift: Moving your hips only (not shoulders): move your weight forward onto your toes, HOLD/FEEL. Move your weight back to equal Flat Foot on both legs, HOLD/FEEL. Move your weight back onto your heels, HOLD/FEEL. Move your weight back to equal on both legs, HOLD/FEEL. Repeat.   tart with both hands on sink, progress to right hand only, then no hands.   Pre-gait Instructions Standing with RW support.   Stand with prosthetic leg forward step. With mirror in front of patient for visual feedback.   Step 1: Weight shift forward so pelvis (hips) over prosthesis & left knee is flexing (keep leg relaxed). Maintain left toe on ground. Repeat step 1 for 5-10 reps.   Step 2: Step left foot forward stepping over imaginary line (pass the toe of your left foot with the heel of your right foot) in front of prosthesis. Repeat step 1+2 for 5-10 reps.   Step 3: Start with weight on prosthesis. Weight shift forward to left leg keeping left knee straight. Prosthetic knee should bend and keep prosthetic toe on ground. Repeat step 3 for 5-10 reps.   Step 4: Bring prosthesis forward with pelvis rotation &  hip flexion (moving leg straight forward) "placing heel" on floor. Repeat step 3+4 for 5-10 reps.

## 2019-10-15 NOTE — Therapy (Signed)
[facility] [address] [place], [place], [address] Phone: [phone]   Fax:  [phone]  Physical Therapy Treatment  Patient Details  Name: Raven Marks MRN: [id number] Date of Birth: 05-21-1956 Referring Provider (PT): Melida Quitter, MD   Encounter Date: 10/15/2019  PT End of Session - 10/15/19 1438    Visit Number  14    Number of Visits  16    Date for PT Re-Evaluation  01/74/94   per recert   Authorization Type  [employer] Medicare; $20 copay.  NO VL.  10th visit PN    PT Start Time  0245    PT Stop Time  0330    PT Time Calculation (min)  45 min    Equipment Utilized During Treatment  Gait belt    Activity Tolerance  Patient tolerated treatment well    Behavior During Therapy  WFL for tasks assessed/performed       Past Medical History:  Diagnosis Date  . Cancer of central portion of right female breast ([place]) 01/08/2016   stage 1A  . Complication of anesthesia    vertigo  . Congenital absence of femur    right  . Endometrioid adenocarcinoma    Stage IA, grade 2  . GERD (gastroesophageal reflux disease)   . History of external beam radiation therapy 10/27/16-12/24/16   right breast 50.4 Gy in 28 fracitons, boost 10 Gy in 5 fractions  . PONV (postoperative nausea and vomiting)   . 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 Spinal stenosis   . Vertigo    associated with anesthesia  . Vitamin D deficiency 09/2012    Past Surgical History:  Procedure Laterality Date  . CESAREAN SECTION    . CHOLECYSTECTOMY N/A 10/11/2013   Procedure: LAPAROSCOPIC CHOLECYSTECTOMY WITH INTRAOPERATIVE CHOLANGIOGRAM;  Surgeon: Joyice Faster. Cornett, MD;  Location: [facility];  Service: General;  Laterality: N/A;  . ERCP N/A 10/12/2013   Procedure: ENDOSCOPIC RETROGRADE CHOLANGIOPANCREATOGRAPHY (ERCP);  Surgeon: Missy Sabins, MD;  Location[facility] ENDOSCOPY;  Service: Endoscopy;  Laterality: N/A;  . PARTIAL  MASTECTOMY WITH AXILLARY SENTINEL LYMPH NODE BIOPSY Right 03/09/16   at [facility]  . REPLACEMENT TOTAL KNEE Left 2010   Dr. Maureen Ralphs  . ROBOTIC ASSISTED TOTAL HYSTERECTOMY WITH BILATERAL SALPINGO OOPHERECTOMY  10/08/2010   Robotic, BSO, bilateral pelvic/ R periaortic LND    There were no vitals filed for this visit.  Subjective Assessment - 10/15/19 1438    Subjective  Pt has appt with prosthetist on 10/17/2019. Has been wearing prosthesis daily for 1 hour sitting - performed wt shifting HEP with new prosthesis on but could only do every other day d/t inc in left lower back and stated having a spasm.    Pertinent History  vitamin D deficiency, spinal stenosis, RLE prosthesis due to birth defect - congenital absence of R femur, R breast CA treated with chemo and radiation, endometrioid adenocarcinoma, Left TKA and neck pain    Diagnostic tests  MRI: Moderate multilevel cervical disc and facet degeneration. No significant spinal stenosis.2. Mild-to-moderate multilevel neural foraminal stenosis as above    Currently in Pain?  No/denies                       [facility] Adult PT Treatment/Exercise - 10/15/19 1547      Transfers   Transfers  Sit to Stand;Stand to Sit    Sit to Stand  5: Supervision;With upper extremity assist;With armrests;From chair/3-in-1    Stand to Sit  5: Supervision;With upper extremity assist;With armrests;To chair/3-in-1      Ambulation/Gait   Pre-Gait Activities  see pt instructions       Posture/Postural Control   Posture/Postural Control  --    Postural Limitations  --    Posture Comments  --      Therapeutic Activites    Therapeutic Activities  --    Other Therapeutic Activities  --      Exercises   Exercises  Other Exercises    Other Exercises   PT taught pt how to use theracane for trigger point release when left low back begins to spasm. When pt found trigger point, therapist assessed location to be more lat oriented. Taught pt how to  perform L lat stretch at home. Pt demonstrated multiple bouts of stretch.       Neck Exercises: Seated   Other Seated Exercise  --      Prosthetics   Prosthetic Care Comments   continue to wear new prosthesis for 1 hour daily while sitting and during standing wt shifting exercise. Continue current wear time after seeing the prosthetist on 10/17/19. PT discussed with prosthetist prior to session and with pt during session checking the medial brim height from floor, socket depth, and to check prosthetic knee and foot rotation.    Current prosthetic wear tolerance (days/week)   old prosthesis daily.  New prosthesis none in last year.     Current prosthetic wear tolerance (#hours/day)   old prosthesis most of awake hours (donnes after am shower & doffes when getting ready for bed).  New prosthesis: 1 hour daily when sitting except for 1 day since last session  adn during standing wt shift exercise every other day     Current prosthetic weight-bearing tolerance (hours/day)   Patient tolerated 2 bouts of 5-10 minutes of standing balance at RW.     Edema  none    Residual limb condition   limb is rotationplasty without ankle /knee joint.  Distal limb is heel pad & plantar skin tissue with no issues. Distal 2/3 is minimal soft tissue over "femur" (probably actually tibia from rotationplasty), proximal 1/3 of limb is normal hip soft tissue / muscles.  No open areas. normal skin color & temperature.     Education Provided  Proper wear schedule/adjustment    Person(s) Educated  Patient;Spouse    Education Method  Explanation;Verbal cues    Education Method  Verbalized understanding         AT [place]. Try to find this position when standing still for activities.  USE TAPE ON FLOOR TO MARK THE MIDLINE POSITION.  You also should try to feel with your limb pressure in socket. You are trying to feel with limb what you used to feel with the  bottom of your foot.  1. Side to Side Shift: Moving your hips only (not shoulders): move weight onto your left leg, HOLD/FEEL. Move back to equal weight on each leg, HOLD/FEEL. Move weight onto your right leg, HOLD/FEEL. Move back to equal weight on each leg, HOLD/FEEL. Repeat. Start with both hands on sink, progress to right hand only, then no hands.  2. Front to Back Shift: Moving your hips only (not shoulders): move your weight forward onto your toes, HOLD/FEEL. Move your weight back to equal Flat Foot on both legs, HOLD/FEEL. Move  your weight back onto your heels, HOLD/FEEL. Move your weight back to equal on both legs, HOLD/FEEL. Repeat. tart with both hands on sink, progress to right hand only, then no hands.    Pre-gait Instructions  Standing with RW support.  Stand with prosthetic leg forward step. With mirror in front of patient for visual feedback.   Step 1: Weight shift forward so pelvis (hips) over prosthesis & left knee is flexing (keep leg relaxed). Maintain left toe on ground. Repeat step 1 for 5-10 reps.   Step 2: Step left foot forward stepping over imaginary line (pass the toe of your left foot with the heel of your right foot) in front of prosthesis. Repeat step 1+2 for 5-10 reps.   Step 3: Start with weight on prosthesis. Weight shift forward to left leg keeping left knee straight. Prosthetic knee should bend and keep prosthetic toe on ground. Repeat step 3 for 5-10 reps.   Step 4: Bring prosthesis forward with pelvis rotation & hip flexion (moving leg straight forward) "placing heel" on floor. Repeat step 3+4 for 5-10 reps.       PT Education - 10/15/19 1555    Education Details  see prosthetic care comments    Person(s) Educated  Patient;Spouse    Methods  Explanation;Verbal cues;Demonstration    Comprehension  Verbalized understanding       PT Short Term Goals - 10/08/19 1500      PT SHORT TERM GOAL #1   Title  Pt will continue vestibular and cervical HEP and  will initiate initial prosthetic HEP    Baseline  10/08/2019: independent with cervical/vestibular HEP, initiated initial prosthetic HEP today - dependent at this time    Time  4    Period  Weeks    Status  Partially Met    Target Date  09/28/19      PT SHORT TERM GOAL #2   Title  Pt will demonstrate improved resting cervical posture positioned ot midline    Baseline  10/08/2019: resting posture position has remained unchanged at this time - may change when beginning to wear new prosthesis more consistently    Time  4    Period  Weeks    Status  Not Met    Target Date  09/28/19      PT SHORT TERM GOAL #4   Title  Patient will tolerate wearing the new prosthesis >/= 2 hours 2x/day without skin integrity issues    Baseline  10/08/2019: currently able to tolerate wearing new prosthesis 1 hour daily when seated - some discomfort, no skin integrity issues    Time  1    Period  Months    Status  Not Met    Target Date  09/28/19      PT SHORT TERM GOAL #5   Title  Patient will verbalize understanding of proper prosthetic care utilization    Baseline  10/08/2019: independent with old prosthesis, dependent with new prosthesis at this current time    Time  1    Period  Months    Status  Not Met    Target Date  09/28/19        PT Long Term Goals - 08/29/19 0941      PT LONG TERM GOAL #1   Title  Pt will demonstrate indpendence with final vestibular and balance HEP  (new LTG due by 10/28/19)    Time  8    Period  Weeks    Status  Revised    Target Date  10/27/18      PT LONG TERM GOAL #2   Title  Pt will report returning to walking [place] trails and [place] with [place] with new prosthesis    Baseline  08/29/19: Pt reports walking on trails with husband at least 3x/week regularly with current prosthesis    Time  8    Period  Weeks    Status  Revised    Target Date  10/28/19      PT LONG TERM GOAL #3   Title  Pt will improve FGA score to >/= 22/30 with new prosthesis     Baseline  08/29/19: 19/30 with old prosthesis    Time  8    Period  Weeks    Status  Revised    Target Date  10/28/19      PT LONG TERM GOAL #4   Title  Pt will demonstrate 8-10 deg increase in cervical functional ROM for increased vestibular input    Baseline  08/29/19: pt demonstrated mild improvement in extension, R side bending & rotation and a 16 deg improvement in L rotation    Time  8    Period  Weeks    Status  On-going    Target Date  10/28/19      PT LONG TERM GOAL #5   Title  Pt will improve use of VOR as indicated by ability to perform x1 viewing horizontal and VOR cancellation for visual flow x 2 minutes without UE support in standing    Baseline  08/29/19: see VORx1 viewing comments, however pt is progressing VORx1 for HEP    Time  8    Period  Weeks    Status  Revised    Target Date  10/28/19      Additional Long Term Goals   Additional Long Term Goals  Yes      PT LONG TERM GOAL #6   Title  Patient will tolerate wearing new prosthesis >90% wake hours with no skin integrity issues of reports of pain    Time  8    Period  Weeks    Status  New    Target Date  10/28/19      PT LONG TERM GOAL #7   Title  Patient will demonstrate ability to donn/doff new prosthesis with supervision only    Time  8    Period  Weeks    Status  New    Target Date  10/28/19      PT LONG TERM GOAL #8   Title  Patient will demonstrate ability to negotiate ramp, curb, and stairs 1-railing with mod-I    Time  8    Period  Weeks    Status  New    Target Date  10/28/19      PT LONG TERM GOAL  #9   TITLE  Patient will ambulate 300' with LRAD and new prosthesis mod-I on various terrain for improved community ambulation    Time  8    Period  Weeks    Status  New    Target Date  10/28/19            Plan - 10/15/19 1439    Clinical Impression Statement  PT session focused on pre-gait mechanics with new prosthesis and weight shifting. Pt has an appt with prosthetist on Wednesday  10/15/2019 to assess new prosthesis medial brim height from floor, knee & foot rotation, and socket  depth. PT advised to continue wear schedule as it to assess how she responds to adjustment. Pt demonstrates apprehension with WB through new prosthesis and working towards unlocking the prosthetic knee to prepare for limb advancement during gait. She demonstrated improved confidence with practice getting to the place where she allowed the prosthetic knee to unlock. Will continue to progress as appropriate. Pt may benefit from continued skilled therapy services to address deficits.    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, R breast CA treated with chemo and radiation, RUE restricted, endometrioid adenocarcinoma, Left TKA and neck pain    Examination-Activity Limitations  Bed Mobility;Bend;Locomotion Level;Reach Overhead;Stand    Examination-Participation Restrictions  Cleaning;Community Activity    Stability/Clinical Decision Making  Stable/Uncomplicated    Rehab Potential  Good    PT Frequency  1x / week    PT Duration  8 weeks    PT Treatment/Interventions  ADLs/Self Care Home Management;Canalith Repostioning;Cryotherapy;Moist Heat;Traction;Gait training;Stair training;Functional mobility training;Therapeutic activities;Therapeutic exercise;Balance training;Neuromuscular re-education;Patient/family education;Manual techniques;Passive range of motion;Vestibular;Taping;Prosthetic Training;DME Instruction    PT Next Visit Plan  continue prosthetic plan is HEP at counter/sink then progress to prosthetic gait with axillary crutches    Consulted and Agree with Plan of Care  Patient       Patient will benefit from skilled therapeutic intervention in order to improve the following deficits  and impairments:  Decreased balance, Dizziness, Pain, Decreased range of motion, Difficulty walking, Abnormal gait, Decreased strength, Postural dysfunction  Visit Diagnosis: Other abnormalities of gait and mobility  Unsteadiness on feet  Difficulty in walking, not elsewhere classified  Abnormal posture     Problem List Patient Active Problem List   Diagnosis Date Noted  . Benign paroxysmal positional vertigo 04/20/2018  . Genetic testing 04/14/2016  . Malignant neoplasm of central portion of right female breast ([place]) 01/08/2016  . Cholelithiasis 10/13/2013  . Choledocholithiasis 10/13/2013  . Acute biliary pancreatitis 10/09/2013  . Endometrial adenocarcinoma ([place]) 03/16/2012    Juliann Pulse SPT 10/15/2019, 3:55 PM  [facility] [address] [place], [place], [address] Phone: [phone]   Fax:  [phone]  Name: Raven Marks MRN: [id number] Date of Birth: May 21, 1956

## 2019-10-22 ENCOUNTER — Encounter: Payer: Self-pay | Admitting: Physical Therapy

## 2019-10-22 ENCOUNTER — Encounter: Payer: Medicare Other | Admitting: Rehabilitation

## 2019-10-24 ENCOUNTER — Ambulatory Visit: Payer: Self-pay

## 2019-10-29 ENCOUNTER — Encounter: Payer: Medicare Other | Admitting: Rehabilitation

## 2019-10-29 ENCOUNTER — Encounter: Payer: Self-pay | Admitting: Physical Therapy

## 2019-11-05 ENCOUNTER — Ambulatory Visit: Payer: Medicare PPO | Admitting: Physical Therapy

## 2019-11-12 ENCOUNTER — Other Ambulatory Visit: Payer: Self-pay

## 2019-11-12 ENCOUNTER — Encounter: Payer: Self-pay | Admitting: Physical Therapy

## 2019-11-12 ENCOUNTER — Ambulatory Visit: Payer: Medicare PPO | Attending: Otolaryngology | Admitting: Physical Therapy

## 2019-11-12 DIAGNOSIS — R42 Dizziness and giddiness: Secondary | ICD-10-CM | POA: Insufficient documentation

## 2019-11-12 DIAGNOSIS — R2681 Unsteadiness on feet: Secondary | ICD-10-CM | POA: Insufficient documentation

## 2019-11-12 DIAGNOSIS — M6281 Muscle weakness (generalized): Secondary | ICD-10-CM | POA: Diagnosis present

## 2019-11-12 DIAGNOSIS — R2689 Other abnormalities of gait and mobility: Secondary | ICD-10-CM | POA: Insufficient documentation

## 2019-11-12 DIAGNOSIS — R293 Abnormal posture: Secondary | ICD-10-CM | POA: Insufficient documentation

## 2019-11-12 DIAGNOSIS — R262 Difficulty in walking, not elsewhere classified: Secondary | ICD-10-CM | POA: Diagnosis present

## 2019-11-12 DIAGNOSIS — M542 Cervicalgia: Secondary | ICD-10-CM | POA: Diagnosis not present

## 2019-11-12 NOTE — Therapy (Signed)
[facility] [address] [place], [place], [address] Phone: [phone]   Fax:  [phone]  Physical Therapy Treatment  Patient Details  Name: Raven Marks MRN: [id number] Date of Birth: 02-02-1956 Referring Provider (PT): Melida Quitter, MD   Encounter Date: 11/12/2019  PT End of Session - 11/12/19 2107    Visit Number  15    Number of Visits  24    Date for PT Re-Evaluation  48/25/00   per recert   Authorization Type  [employer] - 12 PT visits authorized 10/09/19 - 12/26/19    Authorization - Visit Number  2    Authorization - Number of Visits  12    PT Start Time  3704    PT Stop Time  1238    PT Time Calculation (min)  46 min    Activity Tolerance  Patient tolerated treatment well    Behavior During Therapy  [facility] for tasks assessed/performed       Past Medical History:  Diagnosis Date  . Cancer of central portion of right female breast ([place]) 01/08/2016   stage 1A  . Complication of anesthesia    vertigo  . Congenital absence of femur    right  . Endometrioid adenocarcinoma    Stage IA, grade 2  . GERD (gastroesophageal reflux disease)   . History of external beam radiation therapy 10/27/16-12/24/16   right breast 50.4 Gy in 28 fracitons, boost 10 Gy in 5 fractions  . PONV (postoperative nausea and vomiting)   . 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 Spinal stenosis   . Vertigo    associated with anesthesia  . Vitamin D deficiency 09/2012    Past Surgical History:  Procedure Laterality Date  . CESAREAN SECTION    . CHOLECYSTECTOMY N/A 10/11/2013   Procedure: LAPAROSCOPIC CHOLECYSTECTOMY WITH INTRAOPERATIVE CHOLANGIOGRAM;  Surgeon: Joyice Faster. Cornett, MD;  Location: [facility];  Service: General;  Laterality: N/A;  . ERCP N/A 10/12/2013   Procedure: ENDOSCOPIC RETROGRADE CHOLANGIOPANCREATOGRAPHY (ERCP);  Surgeon: Missy Sabins, MD;  Location: [facility];  Service: Endoscopy;   Laterality: N/A;  . PARTIAL MASTECTOMY WITH AXILLARY SENTINEL LYMPH NODE BIOPSY Right 03/09/16   at [facility]  . REPLACEMENT TOTAL KNEE Left 2010   Dr. Maureen Ralphs  . ROBOTIC ASSISTED TOTAL HYSTERECTOMY WITH BILATERAL SALPINGO OOPHERECTOMY  10/08/2010   Robotic, BSO, bilateral pelvic/ R periaortic LND    There were no vitals filed for this visit.  Subjective Assessment - 11/12/19 1159    Subjective  Prosthesis is still undergoing revision at [facility] - updated [facility].  Reviewed upcoming visits.  Pt has continued to work on neck ROM and is seeing improvement.  Has not been having any dizziness until this past weekend due to the weather.  Is having a little back pain from being on the floor with grandkids.    Pertinent History  vitamin D deficiency, spinal stenosis, RLE prosthesis due to birth defect - congenital absence of R femur, R breast CA treated with chemo and radiation, endometrioid adenocarcinoma, Left TKA and neck pain    Diagnostic tests  MRI: Moderate multilevel cervical disc and facet degeneration. No significant spinal stenosis.2. Mild-to-moderate multilevel neural foraminal stenosis as above    Currently in Pain?  Yes         [facility] PT Assessment - 11/12/19 1201      Assessment   Medical Diagnosis  Dizziness, R Transfemoral amputation  Referring Provider (PT)  Melida Quitter, MD    Onset Date/Surgical Date  06/26/19    Hand Dominance  Right    Prior Therapy  yes, multiple times.  Will need therapy for new prosthesis      Precautions   Precautions  Other (comment)    Precaution Comments  vitamin D deficiency, spinal stenosis, RLE prosthesis due to birth defect - congenital absence of R femur, R breast CA treated with chemo and radiation, endometrioid adenocarcinoma, Left TKA and neck pain      Prior Function   Level of Independence  Independent;Independent with household mobility with device;Independent with community mobility with device    Vocation  Retired    Biomedical engineer    Leisure  would like to walk in the park with her husband      Observation/Other Assessments   Focus on Therapeutic Outcomes (FOTO)   N/A      ROM / Strength   AROM / PROM / Strength  AROM      AROM   AROM Assessment Site  Cervical    Cervical Flexion  50    Cervical Extension  50    Cervical - Right Side Bend  35    Cervical - Left Side Bend  35    Cervical - Right Rotation  65    Cervical - Left Rotation  65                    Vestibular Treatment/Exercise - 11/12/19 1218      Vestibular Treatment/Exercise   Vestibular Treatment Provided  Gaze    Gaze Exercises  X1 Viewing Horizontal;X1 Viewing Vertical      X1 Viewing Horizontal   Foot Position  standing with feet apart, UE support bilaterally, wall behind patient > no wall behind patient but with UE support    Reps  2    Comments  60 seconds with wall behind patient - mild symptoms.  Without wall pt had increase in symptoms so slowed head movement and decreased time to 15 seconds      X1 Viewing Vertical   Foot Position  standing with feet apart, UE support bilaterally, wall behind patient > no wall behind patient but with UE support    Reps  2    Comments  60 seconds with and without wall, mild symptoms without wall       Gaze Stabilization - Tip Card -- DO LETTER EXERCISE AFTER DOING YOUR STRETCHES BELOW!  1.Target must remain in focus, not blurry, and appear stationary while head is in motion. 2.Perform exercises with small head movements (45 to either side of midline). 3.Increase speed of head motion so long as target is in focus. 4.If you wear eyeglasses, be sure you can see target through lens (therapist will give specific instructions for bifocal / progressive lenses). 5.These exercises may provoke dizziness or nausea. Work through these symptoms. If too dizzy, slow head movement slightly. Rest between each exercise. 6.Exercises demand concentration; avoid  distractions. 7.For safety, perform standing exercises close to a counter, wall, corner, or next to someone.  Copyright  VHI. All rights reserved.   Gaze Stabilization - Standing Feet Apart   Position letter on wall 3 feet away, at nose level so that when you tilt your chin down the letter will be at eye level.   Stand with wall beside you and cane, no chair behind you -  keeping eyes on target on wall  3 feet away, tilt head down slightly and move head side to side slowly for 15 seconds.  Rest and then repeat moving head up and down for 60 seconds.  Perform 2-3 times per day Keep your symptoms at mild, if they go up to moderate, stop and rest.    Flexibility: Upper Trapezius Stretch    Gently grasp right side of head while reaching behind back with other hand. Tilt head away until a gentle stretch is felt. Hold __30__ seconds. Repeat __2__ times per set.  Do __2-3_ sessions per day.      Begin sitting upright in a chair, placing a rolled towel just below the base of your skull. Gently pull to the side and up looking up at a diagonal (up and right) and then switch (up and left). Hold for 6 seconds each side.  Perform 3-4 times each side.    Begin sitting in an upright position with a rolled towel around your neck. Hold each end of the towel with your hands.  Pull down on towel with left hand, turn your head to the right and then pull the towel out and around with the right hand to turn your neck further.  Try not to put pressure on your jaw.  Hold for 3-4 seconds and then relax. Reverse to perform on other side.  Perform 3 repetitions on each side, 2 times a day.    PT Education - 11/12/19 2106    Education Details  progress towards goals; will schedule more vestibular treatment sessions while awaiting revisions to prosthesis and then will return to prosthetic training.  Adjusted HEP    Person(s) Educated  Patient    Methods  Explanation;Demonstration    Comprehension   Verbalized understanding;Returned demonstration       PT Short Term Goals - 10/08/19 1500      PT SHORT TERM GOAL #1   Title  Pt will continue vestibular and cervical HEP and will initiate initial prosthetic HEP    Baseline  10/08/2019: independent with cervical/vestibular HEP, initiated initial prosthetic HEP today - dependent at this time    Time  4    Period  Weeks    Status  Partially Met    Target Date  09/28/19      PT SHORT TERM GOAL #2   Title  Pt will demonstrate improved resting cervical posture positioned ot midline    Baseline  10/08/2019: resting posture position has remained unchanged at this time - may change when beginning to wear new prosthesis more consistently    Time  4    Period  Weeks    Status  Not Met    Target Date  09/28/19      PT SHORT TERM GOAL #4   Title  Patient will tolerate wearing the new prosthesis >/= 2 hours 2x/day without skin integrity issues    Baseline  10/08/2019: currently able to tolerate wearing new prosthesis 1 hour daily when seated - some discomfort, no skin integrity issues    Time  1    Period  Months    Status  Not Met    Target Date  09/28/19      PT SHORT TERM GOAL #5   Title  Patient will verbalize understanding of proper prosthetic care utilization    Baseline  10/08/2019: independent with old prosthesis, dependent with new prosthesis at this current time    Time  1    Period  Months    Status  Not Met    Target Date  09/28/19        PT Long Term Goals - 11/12/19 2111      PT LONG TERM GOAL #1   Title  Pt will demonstrate indpendence with final vestibular and balance HEP  (new LTG due by 10/28/19)    Time  8    Period  Weeks    Status  Achieved      PT LONG TERM GOAL #2   Title  Pt will report returning to walking on trails and visiting [place] with husband with new prosthesis    Baseline  08/29/19: Pt reports walking on trails with husband at least 3x/week regularly with current prosthesis    Time  8    Period   Weeks    Status  Partially Met      PT LONG TERM GOAL #3   Title  Pt will improve FGA score to >/= 22/30 with new prosthesis    Baseline  08/29/19: 19/30 with old prosthesis    Time  8    Period  Weeks    Status  On-going      PT LONG TERM GOAL #4   Title  Pt will demonstrate 8-10 deg increase in cervical functional ROM for increased vestibular input    Status  Achieved      PT LONG TERM GOAL #5   Title  Pt will improve use of VOR as indicated by ability to perform x1 viewing horizontal and VOR cancellation for visual flow x 2 minutes without UE support in standing    Baseline  standing with bilat UE support, 60 seconds    Time  8    Period  Weeks    Status  Partially Met      PT LONG TERM GOAL #6   Title  Patient will tolerate wearing new prosthesis >90% wake hours with no skin integrity issues of reports of pain    Time  8    Period  Weeks    Status  On-going      PT LONG TERM GOAL #7   Title  Patient will demonstrate ability to donn/doff new prosthesis with supervision only    Time  8    Period  Weeks    Status  On-going      PT LONG TERM GOAL #8   Title  Patient will demonstrate ability to negotiate ramp, curb, and stairs 1-railing with mod-I    Time  8    Period  Weeks    Status  On-going      PT LONG TERM GOAL  #9   TITLE  Patient will ambulate 300' with LRAD and new prosthesis mod-I on various terrain for improved community ambulation    Time  8    Period  Weeks    Status  New      New goals for recert:   PT Short Term Goals - 11/12/19 2128      PT SHORT TERM GOAL #1   Title  = LTG      PT Long Term Goals - 11/12/19 2124      PT LONG TERM GOAL #1   Title  Pt will demonstrate indpendence with final vestibular, prosthetic, and balance HEP    Time  6    Period  Weeks    Status  Revised    Target Date  12/26/19      PT LONG TERM GOAL #2   Title  Pt will report returning to walking on trails and visiting state parks with husband with new prosthesis     Baseline  08/29/19: Pt reports walking on trails with husband at least 3x/week regularly with current prosthesis    Time  6    Period  Weeks    Status  Revised    Target Date  12/26/19      PT LONG TERM GOAL #3   Title  Pt will improve FGA score to >/= 22/30 with new prosthesis    Baseline  08/29/19: 19/30 with old prosthesis    Time  6    Period  Weeks    Status  Revised    Target Date  12/26/19      PT LONG TERM GOAL #5   Title  Pt will improve use of VOR as indicated by ability to perform x1 viewing horizontal and VOR cancellation for visual flow x 2 minutes without UE support in standing    Baseline  standing with bilat UE support, 60 seconds    Time  6    Period  Weeks    Status  Revised    Target Date  12/26/19      PT LONG TERM GOAL #6   Title  Patient will tolerate wearing new prosthesis >90% wake hours with no skin integrity issues of reports of pain    Time  6    Period  Weeks    Status  On-going    Target Date  12/26/19      PT LONG TERM GOAL #7   Title  Patient will demonstrate ability to donn/doff new prosthesis with supervision only    Time  6    Period  Weeks    Status  On-going    Target Date  12/26/19      PT LONG TERM GOAL #8   Title  Patient will demonstrate ability to negotiate ramp, curb, and stairs 1-railing with mod-I    Time  6    Period  Weeks    Status  On-going    Target Date  12/26/19      PT LONG TERM GOAL  #9   TITLE  Patient will ambulate 300' with LRAD and new prosthesis mod-I on various terrain for improved community ambulation    Time  6    Period  Weeks    Status  On-going    Target Date  12/26/19          Plan - 11/12/19 2113    Clinical Impression Statement  Pt returns for assessment of progress towards vestibular goals.  Pt is still awaiting revisions to new prosthesis by [employer] - no prosthetic goals checked today due to not having new prosthesis - prosthetic therapy continues to be on hold until revisions are completed.   All prosthetic goals are ongoing.  Pt has made excellent progress and has met 2 LTG demonstrating independence with current HEP and 15-20 deg increase in neck ROM with more symmetrical neck movement and improved alignment/posture in midline.  Pt has partially met other two goals - is returning to walking outside on trails but with old prosthesis and pt has made progress with x1 viewing but not to goal level.  Pt's dizziness has improved but continues to have intermittent episodes and would benefit from continued skilled PT services to continue to address in order to maximize functional mobility independence and decrease falls risk. HEP updated today for vestibular exercises.    Personal  Factors and Comorbidities  Comorbidity 3+;Finances;Past/Current Experience;Time since onset of injury/illness/exacerbation;Fitness    Comorbidities  vitamin D deficiency, spinal stenosis, RLE prosthesis due to birth defect - congenital absence of R femur, R breast CA treated with chemo and radiation, RUE restricted, endometrioid adenocarcinoma, Left TKA and neck pain    Examination-Activity Limitations  Bed Mobility;Bend;Locomotion Level;Reach Overhead;Stand    Examination-Participation Restrictions  Cleaning;Community Activity    Rehab Potential  Good    PT Frequency  1x / week    PT Duration  6 weeks    PT Treatment/Interventions  ADLs/Self Care Home Management;Canalith Repostioning;Cryotherapy;Moist Heat;Traction;Gait training;Stair training;Functional mobility training;Therapeutic activities;Therapeutic exercise;Balance training;Neuromuscular re-education;Patient/family education;Manual techniques;Passive range of motion;Vestibular;Taping;Prosthetic Training;DME Instruction    PT Next Visit Plan  for vestibular: progress x1 horizontal away from wall to 30 seconds and increase speed if able; VOR cancellation for motion sensitivity?  Isometric neck strengthening.  continue prosthetic plan is HEP at counter/sink then  progress to prosthetic gait with axillary crutches    Consulted and Agree with Plan of Care  Patient       Patient will benefit from skilled therapeutic intervention in order to improve the following deficits and impairments:  Decreased balance, Dizziness, Pain, Decreased range of motion, Difficulty walking, Abnormal gait, Decreased strength, Postural dysfunction  Visit Diagnosis: Cervicalgia  Muscle weakness  Dizziness and giddiness  Abnormal posture  Difficulty in walking, not elsewhere classified  Unsteadiness on feet  Other abnormalities of gait and mobility     Problem List Patient Active Problem List   Diagnosis Date Noted  . Benign paroxysmal positional vertigo 04/20/2018  . Genetic testing 04/14/2016  . Malignant neoplasm of central portion of right female breast ([place]) 01/08/2016  . Cholelithiasis 10/13/2013  . Choledocholithiasis 10/13/2013  . Acute biliary pancreatitis 10/09/2013  . Endometrial adenocarcinoma ([place]) 03/16/2012    Rico Junker, PT, DPT 11/12/19    9:29 PM    [facility] [address] [place], [place], [address] Phone: [phone]   Fax:  [phone]  Name: Raven Marks MRN: [id number] Date of Birth: 11/16/1955

## 2019-11-12 NOTE — Patient Instructions (Signed)
Gaze Stabilization - Tip Card -- DO LETTER EXERCISE AFTER DOING YOUR STRETCHES BELOW!  1.Target must remain in focus, not blurry, and appear stationary while head is in motion. 2.Perform exercises with small head movements (45 to either side of midline). 3.Increase speed of head motion so long as target is in focus. 4.If you wear eyeglasses, be sure you can see target through lens (therapist will give specific instructions for bifocal / progressive lenses). 5.These exercises may provoke dizziness or nausea. Work through these symptoms. If too dizzy, slow head movement slightly. Rest between each exercise. 6.Exercises demand concentration; avoid distractions. 7.For safety, perform standing exercises close to a counter, wall, corner, or next to someone.  Copyright  VHI. All rights reserved.   Gaze Stabilization - Standing Feet Apart   Position letter on wall 3 feet away, at nose level so that when you tilt your chin down the letter will be at eye level.   Stand with wall beside you and cane, no chair behind you -  keeping eyes on target on wall 3 feet away, tilt head down slightly and move head side to side slowly for 15 seconds.  Rest and then repeat moving head up and down for 60 seconds.  Perform 2-3 times per day Keep your symptoms at mild, if they go up to moderate, stop and rest.    Flexibility: Upper Trapezius Stretch    Gently grasp right side of head while reaching behind back with other hand. Tilt head away until a gentle stretch is felt. Hold __30__ seconds. Repeat __2__ times per set.  Do __2-3_ sessions per day.      Begin sitting upright in a chair, placing a rolled towel just below the base of your skull. Gently pull to the side and up looking up at a diagonal (up and right) and then switch (up and left). Hold for 6 seconds each side.  Perform 3-4 times each side.    Begin sitting in an upright position with a rolled towel around your neck. Hold each end of the  towel with your hands.  Pull down on towel with left hand, turn your head to the right and then pull the towel out and around with the right hand to turn your neck further.  Try not to put pressure on your jaw.  Hold for 3-4 seconds and then relax. Reverse to perform on other side.  Perform 3 repetitions on each side, 2 times a day.

## 2019-11-19 ENCOUNTER — Ambulatory Visit: Payer: Medicare PPO | Admitting: Rehabilitation

## 2019-11-23 ENCOUNTER — Ambulatory Visit: Payer: Medicare PPO | Admitting: Physical Therapy

## 2019-11-23 ENCOUNTER — Ambulatory Visit: Payer: Medicare PPO

## 2019-11-26 ENCOUNTER — Encounter: Payer: Self-pay | Admitting: Physical Therapy

## 2019-11-26 ENCOUNTER — Other Ambulatory Visit: Payer: Self-pay

## 2019-11-26 ENCOUNTER — Ambulatory Visit: Payer: Medicare PPO | Attending: Otolaryngology | Admitting: Physical Therapy

## 2019-11-26 DIAGNOSIS — M542 Cervicalgia: Secondary | ICD-10-CM | POA: Diagnosis present

## 2019-11-26 DIAGNOSIS — R2689 Other abnormalities of gait and mobility: Secondary | ICD-10-CM | POA: Diagnosis present

## 2019-11-26 DIAGNOSIS — M6281 Muscle weakness (generalized): Secondary | ICD-10-CM | POA: Insufficient documentation

## 2019-11-26 DIAGNOSIS — R293 Abnormal posture: Secondary | ICD-10-CM | POA: Diagnosis present

## 2019-11-26 DIAGNOSIS — R2681 Unsteadiness on feet: Secondary | ICD-10-CM | POA: Insufficient documentation

## 2019-11-26 DIAGNOSIS — R42 Dizziness and giddiness: Secondary | ICD-10-CM | POA: Insufficient documentation

## 2019-11-26 DIAGNOSIS — R262 Difficulty in walking, not elsewhere classified: Secondary | ICD-10-CM | POA: Insufficient documentation

## 2019-11-26 NOTE — Patient Instructions (Addendum)
Gaze Stabilization - Tip Card -- DO LETTER EXERCISE AFTER DOING YOUR STRETCHES BELOW!  1.Target must remain in focus, not blurry, and appear stationary while head is in motion. 2.Perform exercises with small head movements (45 to either side of midline). 3.Increase speed of head motion so long as target is in focus. 4.If you wear eyeglasses, be sure you can see target through lens (therapist will give specific instructions for bifocal / progressive lenses). 5.These exercises may provoke dizziness or nausea. Work through these symptoms. If too dizzy, slow head movement slightly. Rest between each exercise. 6.Exercises demand concentration; avoid distractions. 7.For safety, perform standing exercises close to a counter, wall, corner, or next to someone.  Copyright  VHI. All rights reserved.   Gaze Stabilization - Standing Feet Apart   Position letter on wall 3 feet away, at nose level so that when you tilt your chin down the letter will be at eye level.   Stand with your cane, no wall behind you -  keeping eyes on target on wall 3 feet away, tilt head down slightly and move head side to side slowly for 30 seconds.   Keep your symptoms at mild, if they go up to moderate, stop and rest.    Flexibility: Upper Trapezius Stretch    Gently grasp right side of head while reaching behind back with other hand. Tilt head away until a gentle stretch is felt. Hold __30__ seconds. Repeat __2__ times per set.  Do __2-3_ sessions per day.      Begin sitting upright in a chair, placing a rolled towel just below the base of your skull. Gently pull to the side and up looking up at a diagonal (up and right) and then switch (up and left). Hold for 6 seconds each side.  Perform 3-4 times each side.    Begin sitting in an upright position with a rolled towel around your neck. Hold each end of the towel with your hands.  Pull down on towel with left hand, turn your head to the right and then pull the  towel out and around with the right hand to turn your neck further.  Try not to put pressure on your jaw.  Hold for 3-4 seconds and then relax. Reverse to perform on other side.  Perform 3 repetitions on each side, 2 times a day.  Visuo-Vestibular: Head / Eyes Moving in Same Direction    Holding a target, keep eyes on target and slowly move target, head, and eyes in SAME direction side to side for ____ seconds. Perform sitting. Repeat __15 seconds per session. Do __2__ sessions per day.  Copyright  VHI. All rights reserved.

## 2019-11-26 NOTE — Therapy (Signed)
[facility] [address] [place], [place], [address] Phone: [phone]   Fax:  [phone]  Physical Therapy Treatment  Patient Details  Name: Raven Marks MRN: [id number] Date of Birth: 03-16-1956 Referring Provider (PT): Melida Quitter, MD   Encounter Date: 11/26/2019  PT End of Session - 11/26/19 [id number]    Visit Number  16    Number of Visits  24    Date for PT Re-Evaluation  [id number]   per recert   Authorization Type  [employer] - 12 PT visits authorized 10/09/19 - 12/26/19    Authorization - Visit Number  3    Authorization - Number of Visits  12    PT Start Time  1100    PT Stop Time  1145    PT Time Calculation (min)  45 min    Activity Tolerance  Patient tolerated treatment well    Behavior During Therapy  [facility] for tasks assessed/performed       Past Medical History:  Diagnosis Date  . Cancer of central portion of right female breast ([place]) 01/08/2016   stage 1A  . Complication of anesthesia    vertigo  . Congenital absence of femur    right  . Endometrioid adenocarcinoma    Stage IA, grade 2  . GERD (gastroesophageal reflux disease)   . History of external beam radiation therapy 10/27/16-12/24/16   right breast 50.4 Gy in 28 fracitons, boost 10 Gy in 5 fractions  . PONV (postoperative nausea and vomiting)   . 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 Spinal stenosis   . Vertigo    associated with anesthesia  . Vitamin D deficiency 09/2012    Past Surgical History:  Procedure Laterality Date  . CESAREAN SECTION    . CHOLECYSTECTOMY N/A 10/11/2013   Procedure: LAPAROSCOPIC CHOLECYSTECTOMY WITH INTRAOPERATIVE CHOLANGIOGRAM;  Surgeon: Joyice Faster. Cornett, MD;  Location: [facility];  Service: General;  Laterality: N/A;  . ERCP N/A 10/12/2013   Procedure: ENDOSCOPIC RETROGRADE CHOLANGIOPANCREATOGRAPHY (ERCP);  Surgeon: Missy Sabins, MD;  Location: [facility] ENDOSCOPY;  Service: Endoscopy;   Laterality: N/A;  . PARTIAL MASTECTOMY WITH AXILLARY SENTINEL LYMPH NODE BIOPSY Right 03/09/16   at [facility]  . REPLACEMENT TOTAL KNEE Left 2010   Dr. Maureen Ralphs  . ROBOTIC ASSISTED TOTAL HYSTERECTOMY WITH BILATERAL SALPINGO OOPHERECTOMY  10/08/2010   Robotic, BSO, bilateral pelvic/ R periaortic LND    There were no vitals filed for this visit.  Subjective Assessment - 11/26/19 1108    Subjective  Pt got vaccine on Wednesday, felt fatigued but then on Friday night pt had true vertigo and had to take Meclizine.  Was better the next day.  Has been doing exercises and increased x1 viewing to 30 seconds.  Goes for neck injections next week - was referred to ENT and for vestibular testing  at [facility].    Pertinent History  vitamin D deficiency, spinal stenosis, RLE prosthesis due to birth defect - congenital absence of R femur, R breast CA treated with chemo and radiation, endometrioid adenocarcinoma, Left TKA and neck pain    Diagnostic tests  MRI: Moderate multilevel cervical disc and facet degeneration. No significant spinal stenosis.2. Mild-to-moderate multilevel neural foraminal stenosis as above    Currently in Pain?  No/denies             Vestibular Assessment - 11/26/19 1113      Other Tests   Comments  Also performed deep head hang with no symptoms      Positional Testing   Dix-Hallpike  Dix-Hallpike Right;Dix-Hallpike Left    Horizontal Canal Testing  Horizontal Canal Right;Horizontal Canal Left      Dix-Hallpike Right   Dix-Hallpike Right Duration  0    Dix-Hallpike Right Symptoms  No nystagmus      Dix-Hallpike Left   Dix-Hallpike Left Duration  0    Dix-Hallpike Left Symptoms  No nystagmus   dizziness returning to sitting from L hallpike     Horizontal Canal Right   Horizontal Canal Right Duration  0    Horizontal Canal Right Symptoms  Normal   head turn, no rolling     Horizontal Canal Left   Horizontal Canal Left Duration  0    Horizontal Canal Left Symptoms   Normal   head turn, no rolling     Positional Sensitivities   Right Knee to Sitting  No dizziness    Nose to Left Knee  No dizziness    Left Knee to Sitting  No dizziness    Head Nodding x 5  No dizziness                Vestibular Treatment/Exercise - 11/26/19 1124      Vestibular Treatment/Exercise   Vestibular Treatment Provided  Gaze    Habituation Exercises  Comment    Gaze Exercises  X1 Viewing Horizontal;X1 Viewing Vertical      Seated Horizontal Head Turns   Symptom Description   15 seconds VOR cancellation in sitting; no symptoms      X1 Viewing Horizontal   Foot Position  standing feet apart, one hand on cane and chair, no wall behind.  Standing with cane only     Reps  4    Comments  30 > 45 > 60 seconds.  With cane only, 30 seconds            PT Education - 11/26/19 1436    Education Details  progressed x1 viewing to one UE support and introduced VOR cancellation for visual motion sensitivity    Person(s) Educated  Patient    Methods  Explanation    Comprehension  Verbalized understanding;Returned demonstration       PT Short Term Goals - 11/12/19 2128      PT SHORT TERM GOAL #1   Title  = LTG        PT Long Term Goals - 11/12/19 2124      PT LONG TERM GOAL #1   Title  Pt will demonstrate indpendence with final vestibular, prosthetic, and balance HEP    Time  6    Period  Weeks    Status  Revised    Target Date  12/26/19      PT LONG TERM GOAL #2   Title  Pt will report returning to walking on trails and [place] with [place] with new prosthesis    Baseline  08/29/19: Pt reports walking on trails with husband at least 3x/week regularly with current prosthesis    Time  6    Period  Weeks    Status  Revised    Target Date  12/26/19      PT LONG TERM GOAL #3   Title  Pt will improve FGA score to >/= 22/30 with new prosthesis    Baseline  08/29/19: 19/30 with old prosthesis    Time  6    Period  Weeks  Status  Revised     Target Date  12/26/19      PT LONG TERM GOAL #5   Title  Pt will improve use of VOR as indicated by ability to perform x1 viewing horizontal and VOR cancellation for visual flow x 2 minutes without UE support in standing    Baseline  standing with bilat UE support, 60 seconds    Time  6    Period  Weeks    Status  Revised    Target Date  12/26/19      PT LONG TERM GOAL #6   Title  Patient will tolerate wearing new prosthesis >90% wake hours with no skin integrity issues of reports of pain    Time  6    Period  Weeks    Status  On-going    Target Date  12/26/19      PT LONG TERM GOAL #7   Title  Patient will demonstrate ability to donn/doff new prosthesis with supervision only    Time  6    Period  Weeks    Status  On-going    Target Date  12/26/19      PT LONG TERM GOAL #8   Title  Patient will demonstrate ability to negotiate ramp, curb, and stairs 1-railing with mod-I    Time  6    Period  Weeks    Status  On-going    Target Date  12/26/19      PT LONG TERM GOAL  #9   TITLE  Patient will ambulate 300' with LRAD and new prosthesis mod-I on various terrain for improved community ambulation    Time  6    Period  Weeks    Status  On-going    Target Date  12/26/19            Plan - 11/26/19 1437    Clinical Impression Statement  Due to pt reporting sudden onset of severe vertigo with bed mobility last week, performed re-assessment of positional testing.  Pt only reported brief vertigo with rising from L hallpike - no nystagmus observed.  Otherwise unable to reproduce symptoms.  No evidence of BPPV observed today and no significant motion sensitivity noted today.  Continued to progress x1 viewing in standing by removing one UE support and introduced VOR cancellation in sitting to address motion sensitivity.  Pt tolerated well with no symptoms of dizziness, just mild imbalance.  Will continue to progress vestibular training (following muscle trigger point injections) at  next visit and then will likely transition back to prosthetic training.    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, R breast CA treated with chemo and radiation, RUE restricted, endometrioid adenocarcinoma, Left TKA and neck pain    Examination-Activity Limitations  Bed Mobility;Bend;Locomotion Level;Reach Overhead;Stand    Examination-Participation Restrictions  Cleaning;Community Activity    Rehab Potential  Good    PT Frequency  1x / week    PT Duration  6 weeks    PT Treatment/Interventions  ADLs/Self Care Home Management;Canalith Repostioning;Cryotherapy;Moist Heat;Traction;Gait training;Stair training;Functional mobility training;Therapeutic activities;Therapeutic exercise;Balance training;Neuromuscular re-education;Patient/family education;Manual techniques;Passive range of motion;Vestibular;Taping;Prosthetic Training;DME Instruction    PT Next Visit Plan  How was trigger point injections?  for vestibular: progress x1 horizontal to 60 seconds, one UE support, VOR cancellation to standing.  Isometric neck strengthening.  continue prosthetic plan is HEP at counter/sink then progress to prosthetic gait with axillary crutches    Consulted and Agree with Plan of Care  Patient       Patient will benefit from skilled therapeutic intervention in order to improve the following deficits and impairments:  Decreased balance, Dizziness, Pain, Decreased range of motion, Difficulty walking, Abnormal gait, Decreased strength, Postural dysfunction  Visit Diagnosis: Cervicalgia  Muscle weakness  Dizziness and giddiness  Abnormal posture  Difficulty in walking, not elsewhere classified  Unsteadiness on feet  Other abnormalities of gait and mobility     Problem List Patient  Active Problem List   Diagnosis Date Noted  . Benign paroxysmal positional vertigo 04/20/2018  . Genetic testing 04/14/2016  . Malignant neoplasm of central portion of right female breast ([place]) 01/08/2016  . Cholelithiasis 10/13/2013  . Choledocholithiasis 10/13/2013  . Acute biliary pancreatitis 10/09/2013  . Endometrial adenocarcinoma ([place]) 03/16/2012    Rico Junker, PT, DPT 11/26/19    2:43 PM    [facility] [address] [place], [place], [address] Phone: [phone]   Fax:  [phone]  Name: Raven Marks MRN: [id number] Date of Birth: August 22, 1956

## 2019-11-29 ENCOUNTER — Other Ambulatory Visit: Payer: Self-pay

## 2019-11-29 ENCOUNTER — Ambulatory Visit (INDEPENDENT_AMBULATORY_CARE_PROVIDER_SITE_OTHER): Payer: Medicare PPO | Admitting: Obstetrics & Gynecology

## 2019-11-29 ENCOUNTER — Encounter: Payer: Self-pay | Admitting: Obstetrics & Gynecology

## 2019-11-29 ENCOUNTER — Other Ambulatory Visit (HOSPITAL_COMMUNITY)
Admission: RE | Admit: 2019-11-29 | Discharge: 2019-11-29 | Disposition: A | Discharge status: Home / Self Care | Payer: Medicare PPO | Source: Ambulatory Visit | Attending: Obstetrics & Gynecology | Admitting: Obstetrics & Gynecology

## 2019-11-29 VITALS — BP 122/74 | HR 76 | Temp 97.9°F | Resp 10 | Ht 64.0 in | Wt 192.0 lb

## 2019-11-29 DIAGNOSIS — Z01419 Encounter for gynecological examination (general) (routine) without abnormal findings: Secondary | ICD-10-CM

## 2019-11-29 DIAGNOSIS — C541 Malignant neoplasm of endometrium: Secondary | ICD-10-CM | POA: Insufficient documentation

## 2019-11-29 DIAGNOSIS — Z205 Contact with and (suspected) exposure to viral hepatitis: Secondary | ICD-10-CM | POA: Diagnosis not present

## 2019-11-29 DIAGNOSIS — Z124 Encounter for screening for malignant neoplasm of cervix: Secondary | ICD-10-CM | POA: Diagnosis present

## 2019-11-29 DIAGNOSIS — R5383 Other fatigue: Secondary | ICD-10-CM

## 2019-11-29 DIAGNOSIS — R7989 Other specified abnormal findings of blood chemistry: Secondary | ICD-10-CM | POA: Diagnosis not present

## 2019-11-29 NOTE — Progress Notes (Signed)
64 y.o. G1P1 Married White or Caucasian female here for annual exam.  Doing well.  Denies vaginal.  Does her MMGs at [facility].  Still followed there yearly by surgeon yearly.  Was released by oncologist.  She has declined an an anti-estrogen.    Denies vaginal bleeding.    She saw Dr. Redmond Baseman last year for vertigo.  She is having PT.  Is going to see Dr. Ahmed Prima at [facility], ENT, for another opinion.    Having some increased fatigue.    Has been retired almost two year.  PCP:  Dr. Harrington Challenger.      Patient's last menstrual period was 09/27/2010.          Sexually active: Yes.    The current method of family planning is status post hysterectomy.    Exercising: No.  The patient does not participate in regular exercise at present. Smoker:  no  Health Maintenance: Pap:   11/28/18 Neg  10/11/16 Neg               10/02/15 Neg History of abnormal Pap:  yes MMG:  11/21/19 BIRADS 2 benign Colonoscopy:  11/21/18 f/u 7 years.  Dr. Cristina Gong.  Colonoscopy was negative BMD:   09/17/16 Osteopenia TDaP:  UTD with PCP Pneumonia vaccine(s):  D/w pt today Shingrix:   never Hep C testing: will obtain today Screening Labs: discuss today   reports that she has never smoked. She has never used smokeless tobacco. She reports that she does not drink alcohol or use drugs.  Past Medical History:  Diagnosis Date  . Cancer of central portion of right female breast ([place]) 01/08/2016   stage 1A  . Complication of anesthesia    vertigo  . Congenital absence of femur    right  . Endometrioid adenocarcinoma    Stage IA, grade 2  . GERD (gastroesophageal reflux disease)   . History of external beam radiation therapy 10/27/16-12/24/16   right breast 50.4 Gy in 28 fracitons, boost 10 Gy in 5 fractions  . PONV (postoperative nausea and vomiting)   . 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 Spinal stenosis   . Vertigo    associated with anesthesia  . Vitamin D deficiency 09/2012    Past Surgical  History:  Procedure Laterality Date  . CESAREAN SECTION    . CHOLECYSTECTOMY N/A 10/11/2013   Procedure: LAPAROSCOPIC CHOLECYSTECTOMY WITH INTRAOPERATIVE CHOLANGIOGRAM;  Surgeon: Joyice Faster. Cornett, MD;  Location: [facility];  Service: General;  Laterality: N/A;  . ERCP N/A 10/12/2013   Procedure: ENDOSCOPIC RETROGRADE CHOLANGIOPANCREATOGRAPHY (ERCP);  Surgeon: Missy Sabins, MD;  Location: [facility] ENDOSCOPY;  Service: Endoscopy;  Laterality: N/A;  . PARTIAL MASTECTOMY WITH AXILLARY SENTINEL LYMPH NODE BIOPSY Right 03/09/16   at [facility]  . REPLACEMENT TOTAL KNEE Left 2010   Dr. Maureen Ralphs  . ROBOTIC ASSISTED TOTAL HYSTERECTOMY WITH BILATERAL SALPINGO OOPHERECTOMY  10/08/2010   Robotic, BSO, bilateral pelvic/ R periaortic LND    Current Outpatient Medications  Medication Sig Dispense Refill  . Acetaminophen 500 MG coapsule Take by mouth.    . cetirizine (ZYRTEC) 10 MG tablet Take by mouth.    . diclofenac sodium (VOLTAREN) 1 % GEL as needed.    . meclizine (ANTIVERT) 50 MG tablet Take 1 tablet (50 mg total) by mouth 3 (three) times daily as needed. 30 tablet 2  . omeprazole (PRILOSEC) 10 MG capsule Take 10 mg by mouth daily.    . RESTASIS  MULTIDOSE 0.05 % ophthalmic emulsion     . ALPRAZolam (XANAX) 0.5 MG tablet 1/2 to 1 tab as needed for anxiety (Patient not taking: Reported on 07/06/2019) 30 tablet 0   No current facility-administered medications for this visit.    Family History  Problem Relation Age of Onset  . Colon cancer Mother   . Colon cancer Father   . Asthma Paternal Grandmother   . Breast cancer Maternal Grandmother   . Breast cancer Maternal Aunt   . Uterine cancer Sister   . Diabetes Sister     Review of Systems  Constitutional: Positive for fatigue.  All other systems reviewed and are negative.   Exam:   BP 122/74 (BP Location: Left Arm, Patient Position: Sitting, Cuff Size: Normal)   Pulse 76   Temp 97.9 F (36.6 C) (Temporal)   Resp 10   Ht 5\' 4"  (1.626 m)   Wt  192 lb (87.1 kg)   LMP 09/27/2010   BMI 32.96 kg/m   Height:   Height: 5\' 4"  (162.6 cm)  Ht Readings from Last 3 Encounters:  11/29/19 5\' 4"  (1.626 m)  03/05/19 5\' 4"  (1.626 m)  11/23/17 5\' 4"  (1.626 m)    General appearance: alert, cooperative and appears stated age Head: Normocephalic, without obvious abnormality, atraumatic Neck: no adenopathy, supple, symmetrical, trachea midline and thyroid normal to inspection and palpation Lungs: clear to auscultation bilaterally Breasts: normal appearance, no masses or tenderness Heart: regular rate and rhythm Abdomen: soft, non-tender; bowel sounds normal; no masses,  no organomegaly Extremities: extremities normal, atraumatic, no cyanosis or edema Skin: Skin color, texture, turgor normal. No rashes or lesions Lymph nodes: Cervical, supraclavicular, and axillary nodes normal. No abnormal inguinal nodes palpated Neurologic: Grossly normal   Pelvic: External genitalia:  no lesions              Urethra:  normal appearing urethra with no masses, tenderness or lesions              Bartholins and Skenes: normal                 Vagina: normal appearing vagina with normal color and discharge, no lesions              Cervix: absent              Pap taken: Yes.   Bimanual Exam:  Uterus:  uterus absent              Adnexa: no mass, fullness, tenderness               Rectovaginal: Confirms               Anus:  normal sphincter tone, no lesions  Chaperone, Terence Lux, CMA, was present for exam.  A:  Well Woman with normal exam PMP, no RHT Stage IA right breast cancer, 12/31/2015, s/p lumpectomy with chemo/radidation.  Had negative genetic testing H/o IB endometrial adenocarcinoma 1/12, s/p TLH/BOS, bilateral LND followed by vaginal cuff radiation Congenital absence of right femur, has prothesis H/o vulvar lichen simplex chronicus Vertigo  P:   Mammogram guidelines reviewed pap smear obtained today.  She requests this yearly.  Guidelines  reviewed. CBC, CMP, TSH, Vit D and B 12 obtained today Prevnar order given to pt Plan BMD next year Colonoscopy UTD Return annually or prn

## 2019-11-30 LAB — COMPREHENSIVE METABOLIC PANEL
ALT: 29 IU/L (ref 0–32)
AST: 31 IU/L (ref 0–40)
Albumin/Globulin Ratio: 1.5 (ref 1.2–2.2)
Albumin: 4.3 g/dL (ref 3.8–4.8)
Alkaline Phosphatase: 125 IU/L — ABNORMAL HIGH (ref 39–117)
BUN/Creatinine Ratio: 14 (ref 12–28)
BUN: 15 mg/dL (ref 8–27)
Bilirubin Total: 0.4 mg/dL (ref 0.0–1.2)
CO2: 23 mmol/L (ref 20–29)
Calcium: 9 mg/dL (ref 8.7–10.3)
Chloride: 104 mmol/L (ref 96–106)
Creatinine, Ser: 1.06 mg/dL — ABNORMAL HIGH (ref 0.57–1.00)
GFR calc Af Amer: 63 mL/min/{1.73_m2} (ref >59)
GFR calc non Af Amer: 55 mL/min/{1.73_m2} — ABNORMAL LOW (ref >59)
Globulin, Total: 2.9 g/dL (ref 1.5–4.5)
Glucose: 82 mg/dL (ref 65–99)
Potassium: 4.7 mmol/L (ref 3.5–5.2)
Sodium: 141 mmol/L (ref 134–144)
Total Protein: 7.2 g/dL (ref 6.0–8.5)

## 2019-11-30 LAB — VITAMIN B12: Vitamin B-12: 586 pg/mL (ref 232–1245)

## 2019-11-30 LAB — CBC
Hematocrit: 39.5 % (ref 34.0–46.6)
Hemoglobin: 13.1 g/dL (ref 11.1–15.9)
MCH: 30.7 pg (ref 26.6–33.0)
MCHC: 33.2 g/dL (ref 31.5–35.7)
MCV: 93 fL (ref 79–97)
Platelets: 304 10*3/uL (ref 150–450)
RBC: 4.27 x10E6/uL (ref 3.77–5.28)
RDW: 12.4 % (ref 11.7–15.4)
WBC: 8.6 10*3/uL (ref 3.4–10.8)

## 2019-11-30 LAB — VITAMIN D 25 HYDROXY (VIT D DEFICIENCY, FRACTURES): Vit D, 25-Hydroxy: 32.5 ng/mL (ref 30.0–100.0)

## 2019-11-30 LAB — CYTOLOGY - PAP: Diagnosis: NEGATIVE

## 2019-11-30 LAB — TSH: TSH: 2.21 u[IU]/mL (ref 0.450–4.500)

## 2019-11-30 LAB — HEPATITIS C ANTIBODY: Hep C Virus Ab: <0.1 s/co ratio (ref 0.0–0.9)

## 2019-12-03 ENCOUNTER — Encounter: Payer: Self-pay | Admitting: Physical Therapy

## 2019-12-03 ENCOUNTER — Other Ambulatory Visit: Payer: Self-pay

## 2019-12-03 ENCOUNTER — Ambulatory Visit: Payer: Medicare PPO | Admitting: Physical Therapy

## 2019-12-03 DIAGNOSIS — R2681 Unsteadiness on feet: Secondary | ICD-10-CM

## 2019-12-03 DIAGNOSIS — R42 Dizziness and giddiness: Secondary | ICD-10-CM

## 2019-12-03 DIAGNOSIS — R293 Abnormal posture: Secondary | ICD-10-CM

## 2019-12-03 DIAGNOSIS — M542 Cervicalgia: Secondary | ICD-10-CM | POA: Diagnosis not present

## 2019-12-03 NOTE — Therapy (Signed)
[facility] [address] [place], [place], [address] Phone: [phone]   Fax:  [phone]  Physical Therapy Treatment  Patient Details  Name: Raven Marks MRN: [id number] Date of Birth: 06-07-1956 Referring Provider (PT): Melida Quitter, MD   Encounter Date: 12/03/2019  PT End of Session - 12/03/19 1728    Visit Number  17    Number of Visits  24    Date for PT Re-Evaluation  [id number]   per recert   Authorization Type  [employer] - 12 PT visits authorized 10/09/19 - 12/26/19    Authorization - Visit Number  4    Authorization - Number of Visits  12    PT Start Time  [id number]    PT Stop Time  1144    PT Time Calculation (min)  39 min    Activity Tolerance  Patient tolerated treatment well    Behavior During Therapy  [facility] for tasks assessed/performed       Past Medical History:  Diagnosis Date  . Cancer of central portion of right female breast ([place]) 01/08/2016   stage 1A  . Complication of anesthesia    vertigo  . Congenital absence of femur    right  . Endometrioid adenocarcinoma    Stage IA, grade 2  . GERD (gastroesophageal reflux disease)   . History of external beam radiation therapy 10/27/16-12/24/16   right breast 50.4 Gy in 28 fracitons, boost 10 Gy in 5 fractions  . PONV (postoperative nausea and vomiting)   . 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 Spinal stenosis   . Vertigo    associated with anesthesia  . Vitamin D deficiency 09/2012    Past Surgical History:  Procedure Laterality Date  . CESAREAN SECTION    . CHOLECYSTECTOMY N/A 10/11/2013   Procedure: LAPAROSCOPIC CHOLECYSTECTOMY WITH INTRAOPERATIVE CHOLANGIOGRAM;  Surgeon: Joyice Faster. Cornett, MD;  Location: [facility];  Service: General;  Laterality: N/A;  . ERCP N/A 10/12/2013   Procedure: ENDOSCOPIC RETROGRADE CHOLANGIOPANCREATOGRAPHY (ERCP);  Surgeon: Missy Sabins, MD;  Location: [facility] ENDOSCOPY;  Service: Endoscopy;   Laterality: N/A;  . PARTIAL MASTECTOMY WITH AXILLARY SENTINEL LYMPH NODE BIOPSY Right 03/09/16   at [facility]  . REPLACEMENT TOTAL KNEE Left 2010   Dr. Maureen Ralphs  . ROBOTIC ASSISTED TOTAL HYSTERECTOMY WITH BILATERAL SALPINGO OOPHERECTOMY  10/08/2010   Robotic, BSO, bilateral pelvic/ R periaortic LND    There were no vitals filed for this visit.  Subjective Assessment - 12/03/19 1111    Subjective  Goes for injection Monday and then [facility] on Thursday.  Would like to come back for vestibular therapy after those appointments to follow up prior to returning to prosthetic training.    Pertinent History  vitamin D deficiency, spinal stenosis, RLE prosthesis due to birth defect - congenital absence of R femur, R breast CA treated with chemo and radiation, endometrioid adenocarcinoma, Left TKA and neck pain    Diagnostic tests  MRI: Moderate multilevel cervical disc and facet degeneration. No significant spinal stenosis.2. Mild-to-moderate multilevel neural foraminal stenosis as above    Currently in Pain?  Yes    Pain Score  3     Pain Location  Hip    Pain Orientation  Lower    Pain Descriptors / Indicators  Sore                        Vestibular  Treatment/Exercise - 12/03/19 1121      Vestibular Treatment/Exercise   Vestibular Treatment Provided  Gaze    Gaze Exercises  X1 Viewing Horizontal;Eye/Head Exercise Horizontal      X1 Viewing Horizontal   Foot Position  standing feet apart, bilat UE support on cane and back of chair > cane and finger tip to back of chair    Reps  2    Comments  increased time to 60 seconds      Eye/Head Exercise Horizontal   Foot Position  standing feet apart, one UE support on cane    Reps  2    Comments  30 seconds each; no symptoms - removed from HEP         Balance Exercises - 12/03/19 1145      Balance Exercises: Standing   Standing Eyes Opened  Wide (BOA);Foam/compliant surface;Other reps (comment)   8 reps, head nods, turns,  tilts       PT Education - 12/03/19 1145    Education Details  revisions to HEP, advised to call office prior to injections to find out about position she will have to be in to do injections and if they can modify to lower anxiety about injections    Person(s) Educated  Patient    Methods  Explanation;Demonstration;Handout    Comprehension  Verbalized understanding;Returned demonstration      Gaze Stabilization - Tip Card -- DO LETTER EXERCISE AFTER DOING YOUR STRETCHES BELOW!  1.Target must remain in focus, not blurry, and appear stationary while head is in motion. 2.Perform exercises with small head movements (45 to either side of midline). 3.Increase speed of head motion so long as target is in focus. 4.If you wear eyeglasses, be sure you can see target through lens (therapist will give specific instructions for bifocal / progressive lenses). 5.These exercises may provoke dizziness or nausea. Work through these symptoms. If too dizzy, slow head movement slightly. Rest between each exercise. 6.Exercises demand concentration; avoid distractions. 7.For safety, perform standing exercises close to a counter, wall, corner, or next to someone.  Copyright  VHI. All rights reserved.   Gaze Stabilization - Standing Feet Apart   Position letter on wall 3 feet away, at nose level so that when you tilt your chin down the letter will be at eye level.   Stand with your cane, finger touch to chair on other side -  keeping eyes on target on wall 3 feet away, tilt head down slightly and move head side to side slowly for 60 seconds.   Keep your symptoms at mild, if they go up to moderate, stop and rest.    Flexibility: Upper Trapezius Stretch    Gently grasp right side of head while reaching behind back with other hand. Tilt head away until a gentle stretch is felt. Hold __30__ seconds. Repeat __2__ times per set.  Do __2-3_ sessions per day.      Begin sitting upright in a chair,  placing a rolled towel just below the base of your skull. Gently pull to the side and up looking up at a diagonal (up and right) and then switch (up and left). Hold for 6 seconds each side.  Perform 3-4 times each side.    Begin sitting in an upright position with a rolled towel around your neck. Hold each end of the towel with your hands.  Pull down on towel with left hand, turn your head to the right and then pull the towel out and  around with the right hand to turn your neck further.  Try not to put pressure on your jaw.  Hold for 3-4 seconds and then relax. Reverse to perform on other side.  Perform 3 repetitions on each side, 2 times a day.   Feet Apart (Compliant Surface) Head Motion - Eyes Open    With eyes open, standing on compliant surface: folded blanket, feet shoulder width apart, tilt head gently to left and right 8 times.  Rest and then move head side to side, 8 times.  Rest and then move head slowly: up and down 8 times. Repeat 1 times per session. Do 1-2 sessions per day.     PT Short Term Goals - 11/12/19 2128      PT SHORT TERM GOAL #1   Title  = LTG        PT Long Term Goals - 11/12/19 2124      PT LONG TERM GOAL #1   Title  Pt will demonstrate indpendence with final vestibular, prosthetic, and balance HEP    Time  6    Period  Weeks    Status  Revised    Target Date  12/26/19      PT LONG TERM GOAL #2   Title  Pt will report returning to walking on trails and [place] with [place] with new prosthesis    Baseline  08/29/19: Pt reports walking on trails with husband at least 3x/week regularly with current prosthesis    Time  6    Period  Weeks    Status  Revised    Target Date  12/26/19      PT LONG TERM GOAL #3   Title  Pt will improve FGA score to >/= 22/30 with new prosthesis    Baseline  08/29/19: 19/30 with old prosthesis    Time  6    Period  Weeks    Status  Revised    Target Date  12/26/19      PT LONG TERM GOAL #5   Title   Pt will improve use of VOR as indicated by ability to perform x1 viewing horizontal and VOR cancellation for visual flow x 2 minutes without UE support in standing    Baseline  standing with bilat UE support, 60 seconds    Time  6    Period  Weeks    Status  Revised    Target Date  12/26/19      PT LONG TERM GOAL #6   Title  Patient will tolerate wearing new prosthesis >90% wake hours with no skin integrity issues of reports of pain    Time  6    Period  Weeks    Status  On-going    Target Date  12/26/19      PT LONG TERM GOAL #7   Title  Patient will demonstrate ability to donn/doff new prosthesis with supervision only    Time  6    Period  Weeks    Status  On-going    Target Date  12/26/19      PT LONG TERM GOAL #8   Title  Patient will demonstrate ability to negotiate ramp, curb, and stairs 1-railing with mod-I    Time  6    Period  Weeks    Status  On-going    Target Date  12/26/19      PT LONG TERM GOAL  #9   TITLE  Patient will ambulate 300' with  LRAD and new prosthesis mod-I on various terrain for improved community ambulation    Time  6    Period  Weeks    Status  On-going    Target Date  12/26/19            Plan - 12/03/19 1729    Clinical Impression Statement  Continued to discuss plan for visits; pt would like to have one more vestibular therapy session after injections and appointment at [facility] before returning to prosthetic training.  Set pt up with one more vestibular treatment.  Continued to review and progress x 1 viewing by continuing to increase time and decrease UE support.  Pt continues to not experience any symptoms with VOR cancellation so exercise removed.  Initiated balance on compliant surface with light UE support while performing head movements in various directions - mild dizziness reported.  Will follow up and reassess pt after trigger point injections and appointment with [facility].    Personal Factors and Comorbidities  Comorbidity  3+;Finances;Past/Current Experience;Time since onset of injury/illness/exacerbation;Fitness    Comorbidities  vitamin D deficiency, spinal stenosis, RLE prosthesis due to birth defect - congenital absence of R femur, R breast CA treated with chemo and radiation, RUE restricted, endometrioid adenocarcinoma, Left TKA and neck pain    Examination-Activity Limitations  Bed Mobility;Bend;Locomotion Level;Reach Overhead;Stand    Examination-Participation Restrictions  Cleaning;Community Activity    Rehab Potential  Good    PT Frequency  1x / week    PT Duration  6 weeks    PT Treatment/Interventions  ADLs/Self Care Home Management;Canalith Repostioning;Cryotherapy;Moist Heat;Traction;Gait training;Stair training;Functional mobility training;Therapeutic activities;Therapeutic exercise;Balance training;Neuromuscular re-education;Patient/family education;Manual techniques;Passive range of motion;Vestibular;Taping;Prosthetic Training;DME Instruction    PT Next Visit Plan  How was trigger point injections?  for vestibular: progress x1 horizontal to 60 seconds with cane only, compliant?  Corner balance on pillow - change foot position to staggered?  Assess goals and recert - set up prosthetic appointments.  Isometric neck strengthening.  continue prosthetic plan is HEP at counter/sink then progress to prosthetic gait with axillary crutches    Consulted and Agree with Plan of Care  Patient       Patient will benefit from skilled therapeutic intervention in order to improve the following deficits and impairments:  Decreased balance, Dizziness, Pain, Decreased range of motion, Difficulty walking, Abnormal gait, Decreased strength, Postural dysfunction  Visit Diagnosis: Cervicalgia  Dizziness and giddiness  Abnormal posture  Unsteadiness on feet     Problem List Patient Active Problem List   Diagnosis Date Noted  . Benign paroxysmal positional vertigo 04/20/2018  . Genetic testing 04/14/2016  .  Malignant neoplasm of central portion of right female breast ([place]) 01/08/2016  . Cholelithiasis 10/13/2013  . Choledocholithiasis 10/13/2013  . Acute biliary pancreatitis 10/09/2013  . Endometrial adenocarcinoma ([place]) 03/16/2012    Rico Junker, PT, DPT 12/03/19    5:34 PM    [facility] [address] [place], [place], [address] Phone: [phone]   Fax:  [phone]  Name: Raven Marks MRN: [id number] Date of Birth: [birth year]

## 2019-12-03 NOTE — Patient Instructions (Addendum)
Gaze Stabilization - Tip Card -- DO LETTER EXERCISE AFTER DOING YOUR STRETCHES BELOW!  1.Target must remain in focus, not blurry, and appear stationary while head is in motion. 2.Perform exercises with small head movements (45 to either side of midline). 3.Increase speed of head motion so long as target is in focus. 4.If you wear eyeglasses, be sure you can see target through lens (therapist will give specific instructions for bifocal / progressive lenses). 5.These exercises may provoke dizziness or nausea. Work through these symptoms. If too dizzy, slow head movement slightly. Rest between each exercise. 6.Exercises demand concentration; avoid distractions. 7.For safety, perform standing exercises close to a counter, wall, corner, or next to someone.  Copyright  VHI. All rights reserved.   Gaze Stabilization - Standing Feet Apart   Position letter on wall 3 feet away, at nose level so that when you tilt your chin down the letter will be at eye level.   Stand with your cane, finger touch to chair on other side -  keeping eyes on target on wall 3 feet away, tilt head down slightly and move head side to side slowly for 60 seconds.   Keep your symptoms at mild, if they go up to moderate, stop and rest.    Flexibility: Upper Trapezius Stretch    Gently grasp right side of head while reaching behind back with other hand. Tilt head away until a gentle stretch is felt. Hold __30__ seconds. Repeat __2__ times per set.  Do __2-3_ sessions per day.      Begin sitting upright in a chair, placing a rolled towel just below the base of your skull. Gently pull to the side and up looking up at a diagonal (up and right) and then switch (up and left). Hold for 6 seconds each side.  Perform 3-4 times each side.    Begin sitting in an upright position with a rolled towel around your neck. Hold each end of the towel with your hands.  Pull down on towel with left hand, turn your head to the right  and then pull the towel out and around with the right hand to turn your neck further.  Try not to put pressure on your jaw.  Hold for 3-4 seconds and then relax. Reverse to perform on other side.  Perform 3 repetitions on each side, 2 times a day.   Feet Apart (Compliant Surface) Head Motion - Eyes Open    With eyes open, standing on compliant surface: folded blanket, feet shoulder width apart, tilt head gently to left and right 8 times.  Rest and then move head side to side, 8 times.  Rest and then move head slowly: up and down 8 times. Repeat 1 times per session. Do 1-2 sessions per day.

## 2019-12-07 ENCOUNTER — Encounter: Payer: Self-pay | Admitting: Obstetrics & Gynecology

## 2019-12-10 ENCOUNTER — Telehealth: Payer: Self-pay

## 2019-12-10 NOTE — Telephone Encounter (Signed)
-----   Message from Megan Salon, MD sent at 12/07/2019  5:41 PM EST ----- Called pt personally.  Lab results discussed.  I do want to repeat a CMP in 2 months.  Please call pt to schedule.  Pap normal.  02 recall.

## 2019-12-10 NOTE — Telephone Encounter (Signed)
Tried calling patient to schedule for 2 month CMP with the lab. No answer, left message for patient to call our office back to schedule. Okay to speak with anyone in the office to schedule.

## 2019-12-12 NOTE — Telephone Encounter (Signed)
Call placed to pt. Spoke to pt. Pt given update on repeat CMP in 2 months. Pt agreeable. Pt scheduled for repeat CMP lab visit on 02/04/2020 at 8:45 am. Pt verbalized understanding and is aware to fast the night before.   Routing to Dr Sabra Heck for review and will close encounter.

## 2019-12-13 NOTE — Telephone Encounter (Signed)
Left detailed message to pt re: labs per Dr Ammie Ferrier message. Pt to return call to office with any questions.   Encounter closed.

## 2019-12-13 NOTE — Telephone Encounter (Signed)
Please let pt know this is not a fasting test.  In fact, I want her to try and hydrate well before the test as it is being done partly to recheck her creatinine.  Please let her know.  Thanks.

## 2019-12-26 ENCOUNTER — Ambulatory Visit: Payer: Medicare PPO | Admitting: Physical Therapy

## 2020-01-18 ENCOUNTER — Ambulatory Visit: Payer: Medicare PPO | Admitting: Physical Therapy

## 2020-01-23 NOTE — Progress Notes (Addendum)
[facility]    Provider:  Dr Jaynee Eagles Requesting Provider: Clabe Seal, * Primary Care Provider:  Lawerance Cruel, MD  CC:  Vesibular migraine  HPI:  Raven Marks is a 64 y.o. female here as requested by Clabe Seal, * for "vestibular migraine".  Past medical history vertigo, spinal stenosis, artificial leg, GERD, congenital absence of femur, stage Ia right breast cancer in 2017,  I reviewed her notes from ear nose and throat for "dizziness", she presented with at least 10 years of dizziness that she states has noticed worsening in the past 3 years, she endorses 2 types of dizziness the first is brief, intense true vertigo with sudden head or body movements that resolves in less than a minute and she had a positive Dix-Hallpike test in the past and currently attends vestibular physical therapy.  The second type of vertigo is a generalized lightheadedness or lower intensity spinning that can last for several hours 3-4 times each week.  She notes associated photophobia, scotomata and bifrontal headaches with episodes, oscillopsia, motion sickness and a history of classic migraines when she was a teenager.  She takes over-the-counter meclizine as needed for episodes on average 1-2 times per week, she also has allergies.  Pure-tone thresholds were within normal limits for right ear and left ear, word recognition in the right ear was excellent in the left ear was excellent, tympanometry revealed normal limits in the right and left ear.  I reviewed his exam which include ocular motor subtests normal, positioning test such as Dix-Hallpike and head roll right negative, positional nystagmus negative, normal vestibular testing.  She is here alone and she reports she started having migraines in HS with her menses. 20 years ago she had an aura, flashing in the eyes and then a headache. She had an extensive eye exam and has been to ENT. She sees sparks of light,  headache behind her eyes and vertigo and she will also get diplopia for one minute. She is having vestibular therapy. She is sensitive to light, now the headaches are dai;y, she can wake with them and they can be positionally worse, tylenol helps. She snores at night, or she can be so quiet he worries he is not breathing,   Reviewed notes, labs and imaging from outside physicians, which showed 11/2019: B12, tsh nml. CMP creat 1.06, BUN 15, cbc normal. She also has neck pain (follows with Dr. Ellene Route), advil, mclizine,   meds tried: tylenol, trigger point injections, meclizine  CT head 12/2011 showed No acute intracranial abnormalities including mass lesion or mass effect, hydrocephalus, extra-axial fluid collection, midline shift, hemorrhage, or acute infarction, large ischemic events (personally reviewed images)     Review of Systems: Patient complains of symptoms per HPI as well as the following symptoms dizziness and headaches. Pertinent negatives and positives per HPI. All others negative.   Social History   Socioeconomic History  . Marital status: Married    Spouse name: Not on file  . Number of children: 1  . Years of education: Not on file  . Highest education level: Master's degree (e.g., MA, MS, MEng, MEd, MSW, MBA)  Occupational History  . Not on file  Tobacco Use  . Smoking status: Never Smoker  . Smokeless tobacco: Never Used  Substance and Sexual Activity  . Alcohol use: No  . Drug use: No  . Sexual activity: Yes    Partners: Male    Birth control/protection: Surgical    Comment: TLH/BSO  Other Topics Concern  . Not on file  Social History Narrative   Retired Education officer, museum   Right handed   Lives at home with husband   Caffeine: 1 cup of coffee every morning and 1 glass of tea during the day   Social Determinants of Health   Financial Resource Strain:   . Difficulty of Paying Living Expenses:   Food Insecurity:   . Worried About Charity fundraiser in the  Last Year:   . Arboriculturist in the Last Year:   Transportation Needs:   . Film/video editor (Medical):   Marland Kitchen Lack of Transportation (Non-Medical):   Physical Activity:   . Days of Exercise per Week:   . Minutes of Exercise per Session:   Stress:   . Feeling of Stress :   Social Connections:   . Frequency of Communication with Friends and Family:   . Frequency of Social Gatherings with Friends and Family:   . Attends Religious Services:   . Active Member of Clubs or Organizations:   . Attends Archivist Meetings:   Marland Kitchen Marital Status:   Intimate Partner Violence:   . Fear of Current or Ex-Partner:   . Emotionally Abused:   Marland Kitchen Physically Abused:   . Sexually Abused:     Family History  Problem Relation Age of Onset  . Colon cancer Mother   . Colon cancer Father   . Asthma Paternal Grandmother   . Breast cancer Maternal Grandmother   . Breast cancer Maternal Aunt   . Uterine cancer Sister   . Diabetes Sister   . Migraines Neg Hx     Past Medical History:  Diagnosis Date  . Cancer of central portion of right female breast ([place]) 01/08/2016   stage 1A  . Complication of anesthesia    vertigo  . Congenital absence of femur    right  . Endometrioid adenocarcinoma    Stage IA, grade 2  . GERD (gastroesophageal reflux disease)   . History of external beam radiation therapy 10/27/16-12/24/16   right breast 50.4 Gy in 28 fracitons, boost 10 Gy in 5 fractions  . PONV (postoperative nausea and vomiting)   . 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 Spinal stenosis   . Vertigo    associated with anesthesia  . Vitamin D deficiency 09/2012    Patient Active Problem List   Diagnosis Date Noted  . Migraine with aura and without status migrainosus, not intractable 01/24/2020  . Vertigo 01/24/2020  . Vestibular migraine 01/24/2020  . Chronic migraine without aura without status migrainosus, not intractable 01/24/2020  . Benign paroxysmal  positional vertigo 04/20/2018  . Genetic testing 04/14/2016  . Malignant neoplasm of central portion of right female breast ([place]) 01/08/2016  . Cholelithiasis 10/13/2013  . Choledocholithiasis 10/13/2013  . Acute biliary pancreatitis 10/09/2013  . Endometrial adenocarcinoma ([place]) 03/16/2012    Past Surgical History:  Procedure Laterality Date  . CESAREAN SECTION    . CHOLECYSTECTOMY N/A 10/11/2013   Procedure: LAPAROSCOPIC CHOLECYSTECTOMY WITH INTRAOPERATIVE CHOLANGIOGRAM;  Surgeon: Joyice Faster. Cornett, MD;  Location: [facility];  Service: General;  Laterality: N/A;  . ERCP N/A 10/12/2013   Procedure: ENDOSCOPIC RETROGRADE CHOLANGIOPANCREATOGRAPHY (ERCP);  Surgeon: Missy Sabins, MD;  Location: [facility] ENDOSCOPY;  Service: Endoscopy;  Laterality: N/A;  . PARTIAL MASTECTOMY WITH AXILLARY SENTINEL LYMPH NODE BIOPSY Right 03/09/16   at [facility]  . REPLACEMENT TOTAL KNEE Left 2010  Dr. Maureen Ralphs  . ROBOTIC ASSISTED TOTAL HYSTERECTOMY WITH BILATERAL SALPINGO OOPHERECTOMY  10/08/2010   Robotic, BSO, bilateral pelvic/ R periaortic LND    Current Outpatient Medications  Medication Sig Dispense Refill  . Acetaminophen 500 MG coapsule Take 1,000 mg by mouth every 4 (four) hours as needed.     . cetirizine (ZYRTEC) 5 MG tablet Take 5 mg by mouth daily.    . diclofenac sodium (VOLTAREN) 1 % GEL as needed.    . meclizine (ANTIVERT) 25 MG tablet Take 25 mg by mouth every 6 (six) hours as needed for dizziness.    Marland Kitchen omeprazole (PRILOSEC) 10 MG capsule Take 10 mg by mouth daily.    Marland Kitchen Propylene Glycol (SYSTANE COMPLETE OP) Apply to eye in the morning, at noon, and at bedtime.    . ALPRAZolam (XANAX) 0.25 MG tablet Take 1-2 tabs (0.25mg -0.50mg ) 30-60 minutes before procedure. May repeat if needed.Do not drive. 4 tablet 0  . Galcanezumab-gnlm (EMGALITY) 120 MG/ML SOAJ Inject 240 mg into the skin once for 1 dose. First month injection 240mg  then 120mg  monthly after that 2 pen 0  . Galcanezumab-gnlm (EMGALITY) 120  MG/ML SOAJ Inject 120 mg into the skin every 30 (thirty) days. First month injection 240mg  then 120mg  monthly after that 1 pen 11  . topiramate (TOPAMAX) 25 MG tablet Start with 25mg  at bedtime (1 tab) and increase to 50mg  (2 tabs) at bedtime in one-two weeks 120 tablet 3   No current facility-administered medications for this visit.    Allergies as of 01/24/2020 - Review Complete 01/24/2020  Allergen Reaction Noted  . Codeine Nausea Only 10/09/2013  . Hydromorphone Nausea Only 03/08/2016  . Morphine Other (See Comments) 03/08/2016  . Penicillins Rash 03/15/2012    Vitals: BP (!) 151/88 (BP Location: Left Arm, Patient Position: Sitting)   Pulse 82   Temp (!) 97.2 F (36.2 C) Comment: taken at front  Ht 5\' 4"  (1.626 m)   Wt 190 lb (86.2 kg)   LMP 09/27/2010   BMI 32.61 kg/m  Last Weight:  Wt Readings from Last 1 Encounters:  01/24/20 190 lb (86.2 kg)   Last Height:   Ht Readings from Last 1 Encounters:  01/24/20 5\' 4"  (1.626 m)     Physical exam: Exam: Gen: NAD, conversant, well nourised, obese, well groomed                     CV: RRR, no MRG. No Carotid Bruits. No peripheral edema, warm, nontender Eyes: Conjunctivae clear without exudates or hemorrhage  Neuro: Detailed Neurologic Exam  Speech:    Speech is normal; fluent and spontaneous with normal comprehension.  Cognition:    The patient is oriented to person, place, and time;     recent and remote memory intact;     language fluent;     normal attention, concentration,     fund of knowledge Cranial Nerves:    The pupils are equal, round, and reactive to light. The fundi are flat. Visual fields are full to finger confrontation. Extraocular movements are intact. Trigeminal sensation is intact and the muscles of mastication are normal. The face is symmetric. The palate elevates in the midline. Hearing intact. Voice is normal. Shoulder shrug is normal. The tongue has normal motion without fasciculations.    Coordination:    No dysmetri or ataxia   Gait:    Antalgic (prosthesis)  Motor Observation:    No asymmetry, no atrophy, and no involuntary movements noted. Tone:  Normal muscle tone.    Posture:    Posture is normal. normal erect    Strength:    Strength is V/V in the upper and lower limbs.      Sensation: intact to LT     Reflex Exam:  DTR's: right LE prosthesis.      Deep tendon reflexes in the upper and lower extremities are normal bilaterally.   Toes:    The toes are downgoing bilaterally (no toe on right)   Clonus:    Clonus is absent.    Assessment/Plan: Patient with 10 years of dizziness, she does have episodes of true vertigo and she has had a positive Dix-Hallpike in the past, but she also has episodes 3-4 times a week of lightheadedness in the lower level of dizziness as she describes it in the setting of headache.  She does have a past medical history of migraines.  She was started on a migraine diet for her vestibular symptoms.  Morning headaches, She snores at night, or she can be so quiet he worries he is not breathing however no fatigue or sleepiness: sleep evaluation  MRI brain w/wo contrast due to concerning symptoms of morning headaches, positional headaches,vision changes  to look for space occupying mass, chiari or intracranial hypertension (pseudotumor), schwannoma, lesion in the IAC. Thin cuts through the [place].  Start Topiramate and titrate to 50mg  - prevention Meclizine and tylenol as needed for symptoms MRI of the brain(xanax prior) Sleep study - see Dr. Brett Fairy for evaluation She is interested in [place] will see if insurance approves.2 injections first month then one monthly after that Continue vestibular therapy   Discussed: There is increased risk for stroke in women with migraine with aura and a contraindication for the combined contraceptive pill for use by women who have migraine with aura. The risk for women with migraine without  aura is lower. However other risk factors like smoking are far more likely to increase stroke risk than migraine. There is a recommendation for no smoking and for the use of OCPs without estrogen such as progestogen only pills particularly for women with migraine with aura.Marland Kitchen People who have migraine headaches with auras may be 3 times more likely to have a stroke caused by a blood clot, compared to migraine patients who don't see auras. Women who take hormone-replacement therapy may be 30 percent more likely to suffer a clot-based stroke than women not taking medication containing estrogen. Other risk factors like smoking and high blood pressure may be  much more important.  Discussed: To prevent or relieve headaches, try the following: Cool Compress. Lie down and place a cool compress on your head.  Avoid headache triggers. If certain foods or odors seem to have triggered your migraines in the past, avoid them. A headache diary might help you identify triggers.  Include physical activity in your daily routine. Try a daily walk or other moderate aerobic exercise.  Manage stress. Find healthy ways to cope with the stressors, such as delegating tasks on your to-do list.  Practice relaxation techniques. Try deep breathing, yoga, massage and visualization.  Eat regularly. Eating regularly scheduled meals and maintaining a healthy diet might help prevent headaches. Also, drink plenty of fluids.  Follow a regular sleep schedule. Sleep deprivation might contribute to headaches Consider biofeedback. With this mind-body technique, you learn to control certain bodily functions -- such as muscle tension, heart rate and blood pressure -- to prevent headaches or reduce headache pain.    Proceed to  emergency room if you experience new or worsening symptoms or symptoms do not resolve, if you have new neurologic symptoms or if headache is severe, or for any concerning symptom.   Provided education and documentation  from American headache Society toolbox including articles on: chronic migraine medication overuse headache, chronic migraines, prevention of migraines, behavioral and other nonpharmacologic treatments for headache.    Orders Placed This Encounter  Procedures  . MR BRAIN W WO CONTRAST  . Ambulatory referral to Sleep Studies  . Ambulatory referral to Physical Therapy   Meds ordered this encounter  Medications  . ALPRAZolam (XANAX) 0.25 MG tablet    Sig: Take 1-2 tabs (0.25mg -0.50mg ) 30-60 minutes before procedure. May repeat if needed.Do not drive.    Dispense:  4 tablet    Refill:  0  . topiramate (TOPAMAX) 25 MG tablet    Sig: Start with 25mg  at bedtime (1 tab) and increase to 50mg  (2 tabs) at bedtime in one-two weeks    Dispense:  120 tablet    Refill:  3  . Galcanezumab-gnlm (EMGALITY) 120 MG/ML SOAJ    Sig: Inject 240 mg into the skin once for 1 dose. First month injection 240mg  then 120mg  monthly after that    Dispense:  2 pen    Refill:  0    . First month injection 240mg (2 injections) then 120mg  (one injection) monthly after that  . Galcanezumab-gnlm (EMGALITY) 120 MG/ML SOAJ    Sig: Inject 120 mg into the skin every 30 (thirty) days. First month injection 240mg  then 120mg  monthly after that    Dispense:  1 pen    Refill:  11    Do not fill for 30 days. First month injection 240mg  then 120mg  monthly after that    Cc: Barrett, Raynald Blend, Lawerance Cruel, MD  Sarina Ill, MD  [facility] [address] [place], [place] [address]  Phone [phone] Fax [phone]

## 2020-01-24 ENCOUNTER — Other Ambulatory Visit: Payer: Self-pay

## 2020-01-24 ENCOUNTER — Ambulatory Visit: Payer: Medicare PPO | Admitting: Neurology

## 2020-01-24 ENCOUNTER — Encounter: Payer: Self-pay | Admitting: Neurology

## 2020-01-24 VITALS — BP 151/88 | HR 82 | Temp 97.2°F | Ht 64.0 in | Wt 190.0 lb

## 2020-01-24 DIAGNOSIS — R519 Headache, unspecified: Secondary | ICD-10-CM

## 2020-01-24 DIAGNOSIS — R51 Headache with orthostatic component, not elsewhere classified: Secondary | ICD-10-CM

## 2020-01-24 DIAGNOSIS — G43109 Migraine with aura, not intractable, without status migrainosus: Secondary | ICD-10-CM | POA: Diagnosis not present

## 2020-01-24 DIAGNOSIS — G43709 Chronic migraine without aura, not intractable, without status migrainosus: Secondary | ICD-10-CM | POA: Diagnosis not present

## 2020-01-24 DIAGNOSIS — G43809 Other migraine, not intractable, without status migrainosus: Secondary | ICD-10-CM | POA: Diagnosis not present

## 2020-01-24 DIAGNOSIS — H532 Diplopia: Secondary | ICD-10-CM

## 2020-01-24 DIAGNOSIS — R42 Dizziness and giddiness: Secondary | ICD-10-CM

## 2020-01-24 MED ORDER — TOPIRAMATE 25 MG PO TABS: ORAL_TABLET | ORAL | 3 refills | Status: DC

## 2020-01-24 MED ORDER — EMGALITY 120 MG/ML ~~LOC~~ SOAJ: 120.0000 mg | SUBCUTANEOUS | 11 refills | Status: DC

## 2020-01-24 MED ORDER — EMGALITY 120 MG/ML ~~LOC~~ SOAJ: 240.0000 mg | Freq: Once | SUBCUTANEOUS | 0 refills | Status: AC

## 2020-01-24 MED ORDER — ALPRAZOLAM 0.25 MG PO TABS: ORAL_TABLET | ORAL | 0 refills | Status: AC

## 2020-01-24 NOTE — Patient Instructions (Addendum)
Start Topiramate and titrate to 50mg  - prevention Meclizine and tylenol as needed for symptoms MRI of the brain(xanax prior) Sleep study - see Dr. Brett Fairy for evaluation She is interested in [place] will see if insurance approves.2 injections first month then one monthly after that   "There is increased risk for stroke in women with migraine with aura and a contraindication for the combined contraceptive pill for use by women who have migraine with aura. The risk for women with migraine without aura is lower. However other risk factors like smoking are far more likely to increase stroke risk than migraine. There is a recommendation for no smoking and for the use of OCPs without estrogen such as progestogen only pills particularly for women with migraine with aura.Marland Kitchen People who have migraine headaches with auras may be 3 times more likely to have a stroke caused by a blood clot, compared to migraine patients who don't see auras. Women who take hormone-replacement therapy may be 30 percent more likely to suffer a clot-based stroke than women not taking medication containing estrogen. Other risk factors like smoking and high blood pressure may be  much more important."  Galcanezumab injection What is this medicine? GALCANEZUMAB (gal ka NEZ ue mab) is used to prevent migraines and treat cluster headaches. This medicine may be used for other purposes; ask your health care provider or pharmacist if you have questions. COMMON BRAND NAME(S): Emgality What should I tell my health care provider before I take this medicine? They need to know if you have any of these conditions:  an unusual or allergic reaction to galcanezumab, other medicines, foods, dyes, or preservatives  pregnant or trying to get pregnant  breast-feeding How should I use this medicine? This medicine is for injection under the skin. You will be taught how to prepare and give this medicine. Use exactly as directed. Take your medicine  at regular intervals. Do not take your medicine more often than directed. It is important that you put your used needles and syringes in a special sharps container. Do not put them in a trash can. If you do not have a sharps container, call your pharmacist or healthcare provider to get one. Talk to your pediatrician regarding the use of this medicine in children. Special care may be needed. Overdosage: If you think you have taken too much of this medicine contact a poison control center or emergency room at once. NOTE: This medicine is only for you. Do not share this medicine with others. What if I miss a dose? If you miss a dose, take it as soon as you can. If it is almost time for your next dose, take only that dose. Do not take double or extra doses. What may interact with this medicine? Interactions are not expected. This list may not describe all possible interactions. Give your health care provider a list of all the medicines, herbs, non-prescription drugs, or dietary supplements you use. Also tell them if you smoke, drink alcohol, or use illegal drugs. Some items may interact with your medicine. What should I watch for while using this medicine? Tell your doctor or healthcare professional if your symptoms do not start to get better or if they get worse. What side effects may I notice from receiving this medicine? Side effects that you should report to your doctor or health care professional as soon as possible:  allergic reactions like skin rash, itching or hives, swelling of the face, lips, or tongue Side effects that usually do  not require medical attention (report these to your doctor or health care professional if they continue or are bothersome):  pain, redness, or irritation at site where injected This list may not describe all possible side effects. Call your doctor for medical advice about side effects. You may report side effects to FDA at 1-800-FDA-1088. Where should I keep my  medicine? Keep out of the reach of children. You will be instructed on how to store this medicine. Throw away any unused medicine after the expiration date on the label. NOTE: This sheet is a summary. It may not cover all possible information. If you have questions about this medicine, talk to your doctor, pharmacist, or health care provider.  2020 Elsevier/Gold Standard (2018-03-01 12:03:23) Alprazolam tablets What is this medicine? ALPRAZOLAM (al PRAY zoe lam) is a benzodiazepine. It is used to treat anxiety and panic attacks. This medicine may be used for other purposes; ask your health care provider or pharmacist if you have questions. COMMON BRAND NAME(S): Xanax What should I tell my health care provider before I take this medicine? They need to know if you have any of these conditions:  an alcohol or drug abuse problem  bipolar disorder, depression, psychosis or other mental health conditions  glaucoma  kidney or liver disease  lung or breathing disease  myasthenia gravis  Parkinson's disease  porphyria  seizures or a history of seizures  suicidal thoughts  an unusual or allergic reaction to alprazolam, other benzodiazepines, foods, dyes, or preservatives  pregnant or trying to get pregnant  breast-feeding How should I use this medicine? Take this medicine by mouth with a glass of water. Follow the directions on the prescription label. Take your medicine at regular intervals. Do not take it more often than directed. Do not stop taking except on your doctor's advice. A special MedGuide will be given to you by the pharmacist with each prescription and refill. Be sure to read this information carefully each time. Talk to your pediatrician regarding the use of this medicine in children. Special care may be needed. Overdosage: If you think you have taken too much of this medicine contact a poison control center or emergency room at once. NOTE: This medicine is only for  you. Do not share this medicine with others. What if I miss a dose? If you miss a dose, take it as soon as you can. If it is almost time for your next dose, take only that dose. Do not take double or extra doses. What may interact with this medicine? Do not take this medicine with any of the following medications:  certain antiviral medicines for HIV or AIDS like delavirdine, indinavir  certain medicines for fungal infections like ketoconazole and itraconazole  narcotic medicines for cough  sodium oxybate This medicine may also interact with the following medications:  alcohol  antihistamines for allergy, cough and cold  certain antibiotics like clarithromycin, erythromycin, isoniazid, rifampin, rifapentine, rifabutin, and troleandomycin  certain medicines for blood pressure, heart disease, irregular heart beat  certain medicines for depression, like amitriptyline, fluoxetine, sertraline  certain medicines for seizures like carbamazepine, oxcarbazepine, phenobarbital, phenytoin, primidone  cimetidine  cyclosporine  female hormones, like estrogens or progestins and birth control pills, patches, rings, or injections  general anesthetics like halothane, isoflurane, methoxyflurane, propofol  grapefruit juice  local anesthetics like lidocaine, pramoxine, tetracaine  medicines that relax muscles for surgery  narcotic medicines for pain  other antiviral medicines for HIV or AIDS  phenothiazines like chlorpromazine, mesoridazine, prochlorperazine, thioridazine  This list may not describe all possible interactions. Give your health care provider a list of all the medicines, herbs, non-prescription drugs, or dietary supplements you use. Also tell them if you smoke, drink alcohol, or use illegal drugs. Some items may interact with your medicine. What should I watch for while using this medicine? Tell your doctor or health care professional if your symptoms do not start to get  better or if they get worse. Do not stop taking except on your doctor's advice. You may develop a severe reaction. Your doctor will tell you how much medicine to take. You may get drowsy or dizzy. Do not drive, use machinery, or do anything that needs mental alertness until you know how this medicine affects you. To reduce the risk of dizzy and fainting spells, do not stand or sit up quickly, especially if you are an older patient. Alcohol may increase dizziness and drowsiness. Avoid alcoholic drinks. If you are taking another medicine that also causes drowsiness, you may have more side effects. Give your health care provider a list of all medicines you use. Your doctor will tell you how much medicine to take. Do not take more medicine than directed. Call emergency for help if you have problems breathing or unusual sleepiness. What side effects may I notice from receiving this medicine? Side effects that you should report to your doctor or health care professional as soon as possible:  allergic reactions like skin rash, itching or hives, swelling of the face, lips, or tongue  breathing problems  confusion  loss of balance or coordination  signs and symptoms of low blood pressure like dizziness; feeling faint or lightheaded, falls; unusually weak or tired  suicidal thoughts or other mood changes Side effects that usually do not require medical attention (report to your doctor or health care professional if they continue or are bothersome):  dizziness  dry mouth  nausea, vomiting  tiredness This list may not describe all possible side effects. Call your doctor for medical advice about side effects. You may report side effects to FDA at 1-800-FDA-1088. Where should I keep my medicine? Keep out of the reach of children. This medicine can be abused. Keep your medicine in a safe place to protect it from theft. Do not share this medicine with anyone. Selling or giving away this medicine is  dangerous and against the law. Store at room temperature between 20 and 25 degrees C (68 and 77 degrees F). This medicine may cause accidental overdose and death if taken by other adults, children, or pets. Mix any unused medicine with a substance like cat litter or coffee grounds. Then throw the medicine away in a sealed container like a sealed bag or a coffee can with a lid. Do not use the medicine after the expiration date. NOTE: This sheet is a summary. It may not cover all possible information. If you have questions about this medicine, talk to your doctor, pharmacist, or health care provider.  2020 Elsevier/Gold Standard (2015-06-12 13:47:25) Topiramate tablets What is this medicine? TOPIRAMATE (toe PYRE a mate) is used to treat seizures in adults or children with epilepsy. It is also used for the prevention of migraine headaches. This medicine may be used for other purposes; ask your health care provider or pharmacist if you have questions. COMMON BRAND NAME(S): Topamax, Topiragen What should I tell my health care provider before I take this medicine? They need to know if you have any of these conditions:  bleeding disorders  kidney  disease  lung or breathing disease, like asthma  suicidal thoughts, plans, or attempt; a previous suicide attempt by you or a family member  an unusual or allergic reaction to topiramate, other medicines, foods, dyes, or preservatives  pregnant or trying to get pregnant  breast-feeding How should I use this medicine? Take this medicine by mouth with a glass of water. Follow the directions on the prescription label. Do not cut, crush or chew this medicine. Swallow the tablets whole. You can take it with or without food. If it upsets your stomach, take it with food. Take your medicine at regular intervals. Do not take it more often than directed. Do not stop taking except on your doctor's advice. A special MedGuide will be given to you by the pharmacist  with each prescription and refill. Be sure to read this information carefully each time. Talk to your pediatrician regarding the use of this medicine in children. While this drug may be prescribed for children as young as 4 years of age for selected conditions, precautions do apply. Overdosage: If you think you have taken too much of this medicine contact a poison control center or emergency room at once. NOTE: This medicine is only for you. Do not share this medicine with others. What if I miss a dose? If you miss a dose, take it as soon as you can. If your next dose is to be taken in less than 6 hours, then do not take the missed dose. Take the next dose at your regular time. Do not take double or extra doses. What may interact with this medicine? This medicine may interact with the following medications:  acetazolamide  alcohol  antihistamines for allergy, cough, and cold  aspirin and aspirin-like medicines  atropine  birth control pills  certain medicines for anxiety or sleep  certain medicines for bladder problems like oxybutynin, tolterodine  certain medicines for depression like amitriptyline, fluoxetine, sertraline  certain medicines for seizures like carbamazepine, phenobarbital, phenytoin, primidone, valproic acid, zonisamide  certain medicines for stomach problems like dicyclomine, hyoscyamine  certain medicines for travel sickness like scopolamine  certain medicines for Parkinson's disease like benztropine, trihexyphenidyl  certain medicines that treat or prevent blood clots like warfarin, enoxaparin, dalteparin, apixaban, dabigatran, and rivaroxaban  digoxin  general anesthetics like halothane, isoflurane, methoxyflurane, propofol  hydrochlorothiazide  ipratropium  lithium  medicines that relax muscles for surgery  metformin  narcotic medicines for pain  NSAIDs, medicines for pain and inflammation, like ibuprofen or naproxen  phenothiazines like  chlorpromazine, mesoridazine, prochlorperazine, thioridazine  pioglitazone This list may not describe all possible interactions. Give your health care provider a list of all the medicines, herbs, non-prescription drugs, or dietary supplements you use. Also tell them if you smoke, drink alcohol, or use illegal drugs. Some items may interact with your medicine. What should I watch for while using this medicine? Visit your doctor or health care professional for regular checks on your progress. Tell your health care professional if your symptoms do not start to get better or if they get worse. Do not stop taking except on your health care professional's advice. You may develop a severe reaction. Your health care professional will tell you how much medicine to take. Wear a medical ID bracelet or chain. Carry a card that describes your disease and details of your medicine and dosage times. This medicine can reduce the response of your body to heat or cold. Dress warm in cold weather and stay hydrated in hot weather.  If possible, avoid extreme temperatures like saunas, hot tubs, very hot or cold showers, or activities that can cause dehydration such as vigorous exercise. Check with your health care professional if you have severe diarrhea, nausea, and vomiting, or if you sweat a lot. The loss of too much body fluid may make it dangerous for you to take this medicine. You may get drowsy or dizzy. Do not drive, use machinery, or do anything that needs mental alertness until you know how this medicine affects you. Do not stand up or sit up quickly, especially if you are an older patient. This reduces the risk of dizzy or fainting spells. Alcohol may interfere with the effect of this medicine. Avoid alcoholic drinks. Tell your health care professional right away if you have any change in your eyesight. Patients and their families should watch out for new or worsening depression or thoughts of suicide. Also watch  out for sudden changes in feelings such as feeling anxious, agitated, panicky, irritable, hostile, aggressive, impulsive, severely restless, overly excited and hyperactive, or not being able to sleep. If this happens, especially at the beginning of treatment or after a change in dose, call your healthcare professional. This medicine may cause serious skin reactions. They can happen weeks to months after starting the medicine. Contact your health care provider right away if you notice fevers or flu-like symptoms with a rash. The rash may be red or purple and then turn into blisters or peeling of the skin. Or, you might notice a red rash with swelling of the face, lips or lymph nodes in your neck or under your arms. Birth control may not work properly while you are taking this medicine. Talk to your health care professional about using an extra method of birth control. Women should inform their health care professional if they wish to become pregnant or think they might be pregnant. There is a potential for serious side effects and harm to an unborn child. Talk to your health care professional for more information. What side effects may I notice from receiving this medicine? Side effects that you should report to your doctor or health care professional as soon as possible:  allergic reactions like skin rash, itching or hives, swelling of the face, lips, or tongue  blood in the urine  changes in vision  confusion  loss of memory  pain in lower back or side  pain when urinating  redness, blistering, peeling or loosening of the skin, including inside the mouth  signs and symptoms of bleeding such as bloody or black, tarry stools; red or dark brown urine; spitting up blood or brown material that looks like coffee grounds; red spots on the skin; unusual bruising or bleeding from the eyes, gums, or nose  signs and symptoms of increased acid in the body like breathing fast; fast heartbeat; headache;  confusion; unusually weak or tired; nausea, vomiting  suicidal thoughts, mood changes  trouble speaking or understanding  unusual sweating  unusually weak or tired Side effects that usually do not require medical attention (report to your doctor or health care professional if they continue or are bothersome):  dizziness  drowsiness  fever  loss of appetite  nausea, vomiting  pain, tingling, numbness in the hands or feet  stomach pain  tiredness  upset stomach This list may not describe all possible side effects. Call your doctor for medical advice about side effects. You may report side effects to FDA at 1-800-FDA-1088. Where should I keep my medicine?  Keep out of the reach of children. Store at room temperature between 15 and 30 degrees C (59 and 86 degrees F). Throw away any unused medicine after the expiration date. NOTE: This sheet is a summary. It may not cover all possible information. If you have questions about this medicine, talk to your doctor, pharmacist, or health care provider.  2020 Elsevier/Gold Standard (2019-04-12 15:07:20)

## 2020-01-24 NOTE — Addendum Note (Signed)
Addended by: Sarina Ill B on: 01/24/2020 11:46 AM   Modules accepted: Orders

## 2020-01-24 NOTE — Addendum Note (Signed)
Addended by: Sarina Ill B on: 01/24/2020 11:52 AM   Modules accepted: Orders

## 2020-01-28 ENCOUNTER — Telehealth: Payer: Self-pay | Admitting: *Deleted

## 2020-01-28 NOTE — Telephone Encounter (Signed)
Completed Emgality PA. Key: [id number]. Awaiting [employer] determination.

## 2020-01-29 NOTE — Telephone Encounter (Signed)
Per Cover My Meds,   PA Case: [id number], Status: Approved, Coverage Starts on: 01/29/2020 12:00:00 AM, Coverage Ends on: 04/30/2020 12:00:00 AM. Questions? Contact [phone].

## 2020-01-30 ENCOUNTER — Telehealth: Payer: Self-pay | Admitting: Neurology

## 2020-01-30 NOTE — Telephone Encounter (Signed)
Mcarthur Rossetti Josem Kaufmann: [id number] (exp. 01/29/20 to 02/28/20) order sent to GI. They will reach out to the patient to schedule.

## 2020-01-31 ENCOUNTER — Ambulatory Visit: Payer: Medicare PPO

## 2020-01-31 ENCOUNTER — Telehealth: Payer: Self-pay

## 2020-01-31 ENCOUNTER — Telehealth (INDEPENDENT_AMBULATORY_CARE_PROVIDER_SITE_OTHER): Payer: Medicare PPO | Admitting: Obstetrics & Gynecology

## 2020-01-31 DIAGNOSIS — R748 Abnormal levels of other serum enzymes: Secondary | ICD-10-CM | POA: Diagnosis not present

## 2020-01-31 DIAGNOSIS — R7989 Other specified abnormal findings of blood chemistry: Secondary | ICD-10-CM

## 2020-01-31 DIAGNOSIS — R42 Dizziness and giddiness: Secondary | ICD-10-CM

## 2020-01-31 NOTE — Telephone Encounter (Signed)
Spoke with patient. Patient wants to discuss ENT visit from [place] on 01/10/20. Would like to discuss medications and f/u.   MyChart visit scheduled for today at 1:30pm with Dr. Sabra Heck.  Routing to provider for final review. Patient is agreeable to disposition. Will close encounter.

## 2020-01-31 NOTE — Telephone Encounter (Signed)
Patient is calling in regards to wanting to discuss results from ENT with Dr. Sabra Heck.

## 2020-01-31 NOTE — Progress Notes (Signed)
Virtual Visit via Video Note  I connected with Raven Marks on 01/31/20 at  1:30 PM EDT by a video enabled telemedicine application and verified that I am speaking with the correct person using two identifiers.  Location: Patient: home Provider: office   I discussed the limitations of evaluation and management by telemedicine and the availability of in person appointments. The patient expressed understanding and agreed to proceed.  History of Present Illness: 39 G1P1 MWF who desired having a virtual visit to discuss recent appt with Dr. Cheron Every at [facility], ENT, due to persistent vertigo.  She has been diagnosed with vestibular migraines and not vertigo.  She recently saw Dr. Jaynee Eagles and has a brain MRi scheduled for 02/17/2020.  She has done some reading and wanted to talk with me about this.  She is aware this is not something that I am very knowledgeable about.  Treatments have been discussed.  Emgality is one of them.  She is concerned about side effect profile.  We discussed this and side effects were reviewed in Up To Date.  Pt also has some questions about having blood work.  Had mildly elevated creatinine and alk phos.  Having repeat testing done.  Questions about these two tests answered.  Would like magnesium, Vit D and metabolic panel obtained.  Had Vit D level 11/29/19.  Results reviewed.  She does not need this repeated at this time.  Orders for Magnesium and CMP placed.     Observations/Objective: WNWD WF, NAD  Assessment and Plan: H/o vertigo that is now possibly vestibular migraine Mildly elevated creatinine and alk phos  Follow Up Instructions: She is going to come into the office for lab work:  CMP and Magnesium will be obtained.  Orders placed.   She is going to let me know what she decides after having the MRI done about treatment   I discussed the assessment and treatment plan with the patient. The patient was provided an opportunity to ask questions and all were  answered. The patient agreed with the plan and demonstrated an understanding of the instructions.   The patient was advised to call back or seek an in-person evaluation if the symptoms worsen or if the condition fails to improve as anticipated.  I provided 20 minutes of non-face-to-face time during this encounter.   Megan Salon, MD

## 2020-02-01 ENCOUNTER — Other Ambulatory Visit: Payer: Self-pay

## 2020-02-03 ENCOUNTER — Encounter: Payer: Self-pay | Admitting: Obstetrics & Gynecology

## 2020-02-04 ENCOUNTER — Other Ambulatory Visit: Payer: Medicare PPO

## 2020-02-04 ENCOUNTER — Other Ambulatory Visit: Payer: Self-pay

## 2020-02-04 DIAGNOSIS — R7989 Other specified abnormal findings of blood chemistry: Secondary | ICD-10-CM

## 2020-02-04 DIAGNOSIS — R748 Abnormal levels of other serum enzymes: Secondary | ICD-10-CM

## 2020-02-05 LAB — COMPREHENSIVE METABOLIC PANEL
ALT: 29 IU/L (ref 0–32)
AST: 30 IU/L (ref 0–40)
Albumin/Globulin Ratio: 1.6 (ref 1.2–2.2)
Albumin: 4.2 g/dL (ref 3.8–4.8)
Alkaline Phosphatase: 109 IU/L (ref 39–117)
BUN/Creatinine Ratio: 19 (ref 12–28)
BUN: 18 mg/dL (ref 8–27)
Bilirubin Total: 0.6 mg/dL (ref 0.0–1.2)
CO2: 22 mmol/L (ref 20–29)
Calcium: 9.1 mg/dL (ref 8.7–10.3)
Chloride: 105 mmol/L (ref 96–106)
Creatinine, Ser: 0.96 mg/dL (ref 0.57–1.00)
GFR calc Af Amer: 71 mL/min/{1.73_m2} (ref >59)
GFR calc non Af Amer: 62 mL/min/{1.73_m2} (ref >59)
Globulin, Total: 2.6 g/dL (ref 1.5–4.5)
Glucose: 82 mg/dL (ref 65–99)
Potassium: 4.1 mmol/L (ref 3.5–5.2)
Sodium: 142 mmol/L (ref 134–144)
Total Protein: 6.8 g/dL (ref 6.0–8.5)

## 2020-02-05 LAB — MAGNESIUM: Magnesium: 1.8 mg/dL (ref 1.6–2.3)

## 2020-02-11 ENCOUNTER — Institutional Professional Consult (permissible substitution): Payer: Medicare PPO | Admitting: Neurology

## 2020-02-17 ENCOUNTER — Ambulatory Visit
Admission: RE | Admit: 2020-02-17 | Discharge: 2020-02-17 | Disposition: A | Discharge status: Home / Self Care | Payer: Medicare PPO | Source: Ambulatory Visit | Attending: Neurology | Admitting: Neurology

## 2020-02-17 DIAGNOSIS — R51 Headache with orthostatic component, not elsewhere classified: Secondary | ICD-10-CM

## 2020-02-17 DIAGNOSIS — H532 Diplopia: Secondary | ICD-10-CM

## 2020-02-17 DIAGNOSIS — R42 Dizziness and giddiness: Secondary | ICD-10-CM

## 2020-02-17 DIAGNOSIS — R519 Headache, unspecified: Secondary | ICD-10-CM

## 2020-02-17 MED ORDER — GADOBENATE DIMEGLUMINE 529 MG/ML IV SOLN
18.0000 mL | Freq: Once | INTRAVENOUS | Status: AC | PRN
  Administered 2020-02-17: 18 mL via INTRAVENOUS

## 2020-02-18 ENCOUNTER — Ambulatory Visit: Payer: Medicare PPO | Admitting: Neurology

## 2020-02-19 ENCOUNTER — Telehealth: Payer: Self-pay | Admitting: Neurology

## 2020-02-19 NOTE — Telephone Encounter (Signed)
Pt called to check on MRI results  

## 2020-02-20 ENCOUNTER — Ambulatory Visit: Payer: Medicare PPO | Admitting: Neurology

## 2020-05-07 ENCOUNTER — Ambulatory Visit: Payer: Medicare PPO | Admitting: Neurology

## 2020-07-24 ENCOUNTER — Ambulatory Visit: Payer: Medicare PPO | Admitting: Neurology

## 2020-07-27 ENCOUNTER — Encounter: Payer: Self-pay | Admitting: Obstetrics & Gynecology

## 2020-07-29 ENCOUNTER — Telehealth: Payer: Self-pay | Admitting: *Deleted

## 2020-07-29 NOTE — Telephone Encounter (Signed)
Left message to call Antoneo Ghrist, RN at [facility] [phone].   

## 2020-07-29 NOTE — Telephone Encounter (Signed)
[place] Good morning Dr. Sabra Heck,  Congratulations on your move! I'm looking forward to seeing you there next summer!  I am concerned about the amount of hair I'm losing when I brush my hair or after I wash it. My hairdresser said it's normal that I am noticing it more now that I'm letting my hair grow longer.  Do I need to take Biotin and if so, what strength would you recommend?  Thank you so much for your time,  Camara Renstrom

## 2020-07-29 NOTE — Telephone Encounter (Signed)
See telephone encounter dated 07/29/20.   Encounter closed.

## 2020-07-30 NOTE — Telephone Encounter (Signed)
Spoke with patient. Patient states she is growing her hair long, has always worn her hair short. Has been noticing increased hair loss, was advised by hairdresser this is normal with long hair. Patient states she is concerned since it seems to be "by the handful".   Advised OV recommended for further evaluation, could be related to hormone changes, vitamin deficiency, stress or diet to name a few, may need labs. Patient states she had labs with PCP recently, "no concerns, all normal". Offered MyChart or OV with Dr. Sabra Heck for further discussion and evaluation, request labs from PCP to Dr. Sabra Heck for review prior to appt. Advised if you choose to do a MyChart visit and additional labs are needed, can return to office for lab appt. Patient declines OV or MyChart visit at this time. Patient will return call to office if she desires to schedule.   Advised I will update Dr. Sabra Heck, our office will return call if any additional recommendations, patient agreeable.   Routing to provider for final review. Patient is agreeable to disposition. Will close encounter.

## 2020-12-04 ENCOUNTER — Other Ambulatory Visit: Payer: Self-pay

## 2020-12-04 ENCOUNTER — Ambulatory Visit (INDEPENDENT_AMBULATORY_CARE_PROVIDER_SITE_OTHER): Payer: Medicare PPO | Admitting: Obstetrics & Gynecology

## 2020-12-04 ENCOUNTER — Encounter (HOSPITAL_BASED_OUTPATIENT_CLINIC_OR_DEPARTMENT_OTHER): Payer: Self-pay | Admitting: Obstetrics & Gynecology

## 2020-12-04 VITALS — BP 165/92 | HR 67 | Ht 64.0 in | Wt 140.0 lb

## 2020-12-04 DIAGNOSIS — Z872 Personal history of diseases of the skin and subcutaneous tissue: Secondary | ICD-10-CM | POA: Diagnosis not present

## 2020-12-04 DIAGNOSIS — Z170 Estrogen receptor positive status [ER+]: Secondary | ICD-10-CM

## 2020-12-04 DIAGNOSIS — Z9189 Other specified personal risk factors, not elsewhere classified: Secondary | ICD-10-CM

## 2020-12-04 DIAGNOSIS — C50111 Malignant neoplasm of central portion of right female breast: Secondary | ICD-10-CM | POA: Diagnosis not present

## 2020-12-04 DIAGNOSIS — C541 Malignant neoplasm of endometrium: Secondary | ICD-10-CM | POA: Diagnosis not present

## 2020-12-04 DIAGNOSIS — Q799 Congenital malformation of musculoskeletal system, unspecified: Secondary | ICD-10-CM

## 2020-12-04 DIAGNOSIS — Z01419 Encounter for gynecological examination (general) (routine) without abnormal findings: Secondary | ICD-10-CM

## 2020-12-04 NOTE — Progress Notes (Signed)
65 y.o. G1P1 Married White or Caucasian female here for breast and pelvic exam.  Pt has hx of endometrial cancer in 2012 and breast cancer about 5 years ago.  Is currently followed at [facility].  Has final appt in 6 months.  Did diagnostic MMG at [facility] in Feb--was normal.  Will be released after 6 month appt.  Wants to discuss follow up plans.  Oncologist at [facility] has recommended continued gyn exams.  Note reviewed.  Denies vaginal bleeding or pelvic pain.  Gave up sugar last year.  Has lost 50 pounds, has no headaches and vertigo is much improved.  Off all headache medication.    Now having issues with her prothesis.  Belt for leg is too loose.  Leg falls off easily.  She has appt for redoing prothesis in about a month.  Waiting until after grandchildren visit.  She has questions about the 4th Covid vaccination booster and whether she should do it.    Patient's last menstrual period was 09/27/2010.          Sexually active: Yes.    H/O STD:  no  Health Maintenance: PCP:  Dr. Harrington Challenger.  Last wellness appt was 09/2020.  Did blood work at that appt: Yes  Vaccines are up to date:  Reviewed with pt.  I do not have the date for tetanus administration Colonoscopy:  11/21/2018, Dr. Cristina Gong MMG:  11/21/2020 at [facility] BMD:  09/17/2016.  Plan to do with MMG next year. Last pap smear:  Pt does desire to continue.  Guidelines reviewed.  Will plan to repeat next year. H/o abnormal pap smear:  With endometrial cancer finding   reports that she has never smoked. She has never used smokeless tobacco. She reports that she does not drink alcohol and does not use drugs.  Past Medical History:  Diagnosis Date  . Cancer of central portion of right female breast ([place]) 01/08/2016   stage 1A  . Complication of anesthesia    vertigo  . Congenital absence of femur    right  . Endometrioid adenocarcinoma    Stage IA, grade 2  . GERD (gastroesophageal reflux disease)   . History of external beam radiation therapy  10/27/16-12/24/16   right breast 50.4 Gy in 28 fracitons, boost 10 Gy in 5 fractions  . PONV (postoperative nausea and vomiting)   . 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 Spinal stenosis   . Vertigo    associated with anesthesia  . Vitamin D deficiency 09/2012    Past Surgical History:  Procedure Laterality Date  . CESAREAN SECTION    . CHOLECYSTECTOMY N/A 10/11/2013   Procedure: LAPAROSCOPIC CHOLECYSTECTOMY WITH INTRAOPERATIVE CHOLANGIOGRAM;  Surgeon: Joyice Faster. Cornett, MD;  Location: [place];  Service: General;  Laterality: N/A;  . ERCP N/A 10/12/2013   Procedure: ENDOSCOPIC RETROGRADE CHOLANGIOPANCREATOGRAPHY (ERCP);  Surgeon: Missy Sabins, MD;  Location: [facility] ENDOSCOPY;  Service: Endoscopy;  Laterality: N/A;  . PARTIAL MASTECTOMY WITH AXILLARY SENTINEL LYMPH NODE BIOPSY Right 03/09/16   at [facility]  . REPLACEMENT TOTAL KNEE Left 2010   Dr. Maureen Ralphs  . ROBOTIC ASSISTED TOTAL HYSTERECTOMY WITH BILATERAL SALPINGO OOPHERECTOMY  10/08/2010   Robotic, BSO, bilateral pelvic/ R periaortic LND    Current Outpatient Medications  Medication Sig Dispense Refill  . Acetaminophen 500 MG coapsule Take 1,000 mg by mouth every 4 (four) hours as needed.     . ALPRAZolam (XANAX) 0.25 MG tablet Take 1-2 tabs (  0.25mg -0.50mg ) 30-60 minutes before procedure. May repeat if needed.Do not drive. 4 tablet 0  . cetirizine (ZYRTEC) 5 MG tablet Take 5 mg by mouth daily.    . diclofenac sodium (VOLTAREN) 1 % GEL as needed.    Marland Kitchen omeprazole (PRILOSEC) 10 MG capsule Take 10 mg by mouth daily.    Marland Kitchen Propylene Glycol (SYSTANE COMPLETE OP) Apply to eye in the morning, at noon, and at bedtime.    . Galcanezumab-gnlm (EMGALITY) 120 MG/ML SOAJ Inject 120 mg into the skin every 30 (thirty) days. First month injection 240mg  then 120mg  monthly after that (Patient not taking: Reported on 12/04/2020) 1 pen 11  . meclizine (ANTIVERT) 25 MG tablet Take 25 mg by mouth every 6 (six) hours as needed for  dizziness. (Patient not taking: Reported on 12/04/2020)    . topiramate (TOPAMAX) 25 MG tablet Start with 25mg  at bedtime (1 tab) and increase to 50mg  (2 tabs) at bedtime in one-two weeks (Patient not taking: Reported on 12/04/2020) 120 tablet 3   No current facility-administered medications for this visit.    Family History  Problem Relation Age of Onset  . Colon cancer Mother   . Colon cancer Father   . Asthma Paternal Grandmother   . Breast cancer Maternal Grandmother   . Breast cancer Maternal Aunt   . Uterine cancer Sister   . Diabetes Sister   . Migraines Neg Hx     Review of Systems  Constitutional: Negative.   Gastrointestinal: Negative.   Genitourinary: Negative.     Exam:   BP (!) 165/92   Pulse 67   Ht 5\' 4"  (1.626 m)   Wt 140 lb (63.5 kg)   LMP 09/27/2010   BMI 24.03 kg/m   Height: 5\' 4"  (162.6 cm)  General appearance: alert, cooperative and appears stated age Breasts: normal appearance, no masses or tenderness Abdomen: soft, non-tender; bowel sounds normal; no masses,  no organomegaly Lymph nodes: Cervical, supraclavicular, and axillary nodes normal.  No abnormal inguinal nodes palpated Neurologic: Grossly normal  Pelvic: External genitalia:  no lesions              Urethra:  normal appearing urethra with no masses, tenderness or lesions              Bartholins and Skenes: normal                 Vagina: normal appearing vagina with normal color and discharge, no lesions              Cervix: absent              Pap taken: No. Bimanual Exam:  Uterus:  uterus absent              Adnexa: no mass, fullness, tenderness               Rectovaginal: Confirms               Anus:  normal sphincter tone, no lesions  Chaperone, Prince Rome, CMA, was present for exam.  Assessment/Plan: 1. GYN exam for high-risk Medicare patient - pap smear guidelines reviewed.  Pt comfortable skipping this year but will want pap next year.  She's just not quite ready to stop these  even knowing they do not help with detecting recurrent of endometrial cancer - MMG done 11/21/2020 at [facility].  Reviewed in [place].   - Colonoscopy 11/21/2018 with Dr. Cristina Gong - BMD 09/17/2016.  Feel she should repeat.  She will plan to do with MMG and will let me know if going to have done at [facility] or here.  If here, I will need to place orders.  2. Endometrial adenocarcinoma ([place]) - H/o IB endometrial adenocarcinoma 1/12, s/p TLH/BOS, bilateral LND followed by vaginal cuff radiation  3. Malignant neoplasm of central portion of right breast in female, estrogen receptor positive ([place]) - Stage IA right breast cancer, 12/31/2015, s/p lumpectomy with chemo/radidation.  Had negative genetic testing - followed at [facility].  Will be released after next visit in 6 months. - will continue yearly visits with me  4. H/O lichenification and lichen simplex chronicus  5. 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  Specifically we reviewed current oncology recommendations, follow up plan, pap smear guidelines related to endometrial cancer, endometrial cancer follow up guidelines

## 2020-12-04 NOTE — Patient Instructions (Addendum)
Prevnar now Pneumovax in a year  I think it's ok to hold on the shingles vaccine  Pfizer booster could be done 6 months after last dose.

## 2021-02-27 ENCOUNTER — Ambulatory Visit: Payer: Medicare PPO

## 2021-06-19 ENCOUNTER — Encounter (HOSPITAL_BASED_OUTPATIENT_CLINIC_OR_DEPARTMENT_OTHER): Payer: Self-pay

## 2021-06-23 ENCOUNTER — Other Ambulatory Visit (HOSPITAL_BASED_OUTPATIENT_CLINIC_OR_DEPARTMENT_OTHER): Payer: Self-pay | Admitting: Obstetrics & Gynecology

## 2021-06-23 DIAGNOSIS — Z1231 Encounter for screening mammogram for malignant neoplasm of breast: Secondary | ICD-10-CM

## 2021-06-30 ENCOUNTER — Encounter (HOSPITAL_BASED_OUTPATIENT_CLINIC_OR_DEPARTMENT_OTHER): Payer: Self-pay

## 2021-11-26 ENCOUNTER — Ambulatory Visit (HOSPITAL_BASED_OUTPATIENT_CLINIC_OR_DEPARTMENT_OTHER): Payer: Medicare PPO | Admitting: Radiology

## 2021-11-30 ENCOUNTER — Encounter (HOSPITAL_BASED_OUTPATIENT_CLINIC_OR_DEPARTMENT_OTHER): Payer: Self-pay | Admitting: Radiology

## 2021-11-30 ENCOUNTER — Other Ambulatory Visit: Payer: Self-pay

## 2021-11-30 ENCOUNTER — Ambulatory Visit (HOSPITAL_BASED_OUTPATIENT_CLINIC_OR_DEPARTMENT_OTHER)
Admission: RE | Admit: 2021-11-30 | Discharge: 2021-11-30 | Disposition: A | Discharge status: Home / Self Care | Payer: Medicare PPO | Source: Ambulatory Visit | Attending: Obstetrics & Gynecology | Admitting: Obstetrics & Gynecology

## 2021-11-30 DIAGNOSIS — Z1231 Encounter for screening mammogram for malignant neoplasm of breast: Secondary | ICD-10-CM | POA: Diagnosis not present

## 2022-06-25 ENCOUNTER — Ambulatory Visit (INDEPENDENT_AMBULATORY_CARE_PROVIDER_SITE_OTHER): Payer: Medicare PPO | Admitting: Obstetrics & Gynecology

## 2022-06-25 ENCOUNTER — Encounter (HOSPITAL_BASED_OUTPATIENT_CLINIC_OR_DEPARTMENT_OTHER): Payer: Self-pay | Admitting: Obstetrics & Gynecology

## 2022-06-25 VITALS — BP 126/61 | HR 60 | Ht 64.0 in | Wt 121.2 lb

## 2022-06-25 DIAGNOSIS — C541 Malignant neoplasm of endometrium: Secondary | ICD-10-CM

## 2022-06-25 DIAGNOSIS — Z9189 Other specified personal risk factors, not elsewhere classified: Secondary | ICD-10-CM

## 2022-06-25 DIAGNOSIS — Z872 Personal history of diseases of the skin and subcutaneous tissue: Secondary | ICD-10-CM | POA: Diagnosis not present

## 2022-06-25 DIAGNOSIS — D171 Benign lipomatous neoplasm of skin and subcutaneous tissue of trunk: Secondary | ICD-10-CM

## 2022-06-25 DIAGNOSIS — C50111 Malignant neoplasm of central portion of right female breast: Secondary | ICD-10-CM | POA: Diagnosis not present

## 2022-06-25 DIAGNOSIS — Z170 Estrogen receptor positive status [ER+]: Secondary | ICD-10-CM

## 2022-06-25 DIAGNOSIS — E559 Vitamin D deficiency, unspecified: Secondary | ICD-10-CM

## 2022-06-25 DIAGNOSIS — R634 Abnormal weight loss: Secondary | ICD-10-CM

## 2022-06-25 DIAGNOSIS — L723 Sebaceous cyst: Secondary | ICD-10-CM

## 2022-06-25 NOTE — Patient Instructions (Addendum)
Vaccines you need:    1) flu and the new Covid update.  Do those first. 2) Pneumovax 3) Shingles (Shingrix).  Second dosing is given 2-6 months from the first.  You will feel like you are coming down the flu.   4) The RSV vaccine is available now 5) Tdap with exposure

## 2022-06-25 NOTE — Progress Notes (Signed)
66 y.o. G1P1 Married White or Caucasian female here for breast and pelvic exam.  I am also following her for history of endometrial cancer.  Surgery was 09/2010.  She does not have any vaginal bleeding.  Has some sensation of cramping at times.    Pt does want blood work today.  Is fasting.  Has worked on weight loss since I saw her last.  Has done a lot of walking.  Having some issues with her prosthesis now due to the weight loss.  Has place on back that she wants me to look at today and make recommendation about this today.  Patient's last menstrual period was 09/27/2010.          Sexually active: No.  H/O STD:  no  Health Maintenance: PCP:  Dr. Harrington Challenger.  Last wellness appt was 2021 Vaccines are up to date:  Reviewed with patient.   Colonoscopy:  11/21/2018, follow up 5 years. MMG:  11/30/2021 Negative BMD:  07/02/2014, declines another Last pap smear:  11/29/2019 Negative.   H/o abnormal pap smear:  no    reports that she has never smoked. She has never used smokeless tobacco. She reports that she does not drink alcohol and does not use drugs.  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 Cornett, MD;   Location: [place];  Service: General;  Laterality: N/A;   ERCP N/A 10/12/2013   Procedure: ENDOSCOPIC RETROGRADE CHOLANGIOPANCREATOGRAPHY (ERCP);  Surgeon: Missy Sabins, MD;  Location: [facility] ENDOSCOPY;  Service: Endoscopy;  Laterality: N/A;   PARTIAL MASTECTOMY WITH AXILLARY SENTINEL LYMPH NODE BIOPSY Right 03/09/16   at [facility] Left 2010   Dr. Maureen Ralphs   ROBOTIC ASSISTED TOTAL HYSTERECTOMY WITH BILATERAL SALPINGO OOPHERECTOMY  10/08/2010   Robotic, BSO, bilateral pelvic/ R periaortic LND    Current Outpatient Medications  Medication Sig Dispense Refill   Acetaminophen 500 MG coapsule Take 1,000 mg by mouth every 4 (four) hours as needed.      ALPRAZolam (XANAX) 0.25 MG tablet Take 1-2 tabs (0.'25mg'$ -0.'50mg'$ ) 30-60 minutes before procedure. May repeat if needed.Do not drive. 4 tablet 0   cetirizine (ZYRTEC) 5 MG tablet Take 5 mg by mouth daily.     diclofenac sodium (VOLTAREN) 1 % GEL as needed.     meclizine (ANTIVERT) 25 MG tablet Take 25 mg by mouth every 6 (six) hours as needed for dizziness. (Patient not taking: Reported on 12/04/2020)     omeprazole (PRILOSEC) 10 MG capsule Take 10 mg by mouth daily.     Propylene Glycol (SYSTANE COMPLETE OP) Apply to eye in the  morning, at noon, and at bedtime.     No current facility-administered medications for this visit.    Family History  Problem Relation Age of Onset   Colon cancer Mother    Colon cancer Father    Asthma Paternal Grandmother    Breast cancer Maternal Grandmother    Breast cancer Maternal Aunt    Uterine cancer Sister    Diabetes Sister    Migraines Neg Hx     Review of Systems  Constitutional: Negative.   Genitourinary: Negative.     Exam:   BP 126/61 (BP Location: Left Arm, Patient Position: Sitting, Cuff Size: Large)   Pulse 60   Ht '5\' 4"'$  (1.626 m) Comment: reported  Wt 121 lb 3.2 oz (55 kg)   LMP 09/27/2010   BMI 20.80 kg/m   Height: '5\' 4"'$  (162.6 cm) (reported)  General appearance:  alert, cooperative and appears stated age Breasts:  radiation changes right breast, no masses, nipple discharge or LAD; left breast normal without masses, skin changes, or LAD Abdomen: soft, non-tender; bowel sounds normal; no masses,  no organomegaly Lymph nodes: Cervical, supraclavicular, and axillary nodes normal.  No abnormal inguinal nodes palpated Neurologic: Grossly normal Skin: right mid back, 5cm lipoma present  Pelvic: External genitalia:  no lesions              Urethra:  normal appearing urethra with no masses, tenderness or lesions              Bartholins and Skenes: normal                 Vagina: normal appearing vagina with atrophic changes and no discharge, no lesions              Cervix: absent              Pap taken: No. Bimanual Exam:  Uterus:  uterus absent              Adnexa: normal adnexa and no mass, fullness, tenderness               Rectovaginal: Confirms               Anus:  normal sphincter tone, no lesions  Chaperone, Octaviano Batty, CMA, was present for exam.  Assessment/Plan: 1. GYN exam for high-risk Medicare patient - Pap smear not indicated - Mammogram up to date - Colonoscopy 2020 - Bone mineral density declined - lab work done done with PCP - vaccines reviewed  2. Malignant neoplasm of central portion of right breast in female, estrogen receptor positive ([place]) - normal exam  3. Endometrial adenocarcinoma ([place])  4. Weight loss - Comprehensive metabolic panel - CBC with Differential/Platelet - Hemoglobin A1c - Lipid panel - TSH  5. Vitamin D deficiency - VITAMIN D 25 Hydroxy (Vit-D Deficiency, Fractures)  6.  Lipoma of torso - Ambulatory referral to Plastic Surgery

## 2022-06-26 LAB — TSH: TSH: 2.06 u[IU]/mL (ref 0.450–4.500)

## 2022-06-26 LAB — CBC WITH DIFFERENTIAL/PLATELET
Basophils Absolute: 0 10*3/uL (ref 0.0–0.2)
Basos: 1 %
EOS (ABSOLUTE): 0.1 10*3/uL (ref 0.0–0.4)
Eos: 2 %
Hematocrit: 37 % (ref 34.0–46.6)
Hemoglobin: 12.3 g/dL (ref 11.1–15.9)
Immature Grans (Abs): 0 10*3/uL (ref 0.0–0.1)
Immature Granulocytes: 0 %
Lymphocytes Absolute: 1.7 10*3/uL (ref 0.7–3.1)
Lymphs: 28 %
MCH: 31 pg (ref 26.6–33.0)
MCHC: 33.2 g/dL (ref 31.5–35.7)
MCV: 93 fL (ref 79–97)
Monocytes Absolute: 0.5 10*3/uL (ref 0.1–0.9)
Monocytes: 8 %
Neutrophils Absolute: 3.7 10*3/uL (ref 1.4–7.0)
Neutrophils: 61 %
Platelets: 213 10*3/uL (ref 150–450)
RBC: 3.97 x10E6/uL (ref 3.77–5.28)
RDW: 12.6 % (ref 11.7–15.4)
WBC: 6 10*3/uL (ref 3.4–10.8)

## 2022-06-26 LAB — COMPREHENSIVE METABOLIC PANEL
ALT: 30 IU/L (ref 0–32)
AST: 26 IU/L (ref 0–40)
Albumin/Globulin Ratio: 1.8 (ref 1.2–2.2)
Albumin: 4.5 g/dL (ref 3.9–4.9)
Alkaline Phosphatase: 88 IU/L (ref 44–121)
BUN/Creatinine Ratio: 29 — ABNORMAL HIGH (ref 12–28)
BUN: 27 mg/dL (ref 8–27)
Bilirubin Total: 0.4 mg/dL (ref 0.0–1.2)
CO2: 23 mmol/L (ref 20–29)
Calcium: 9.4 mg/dL (ref 8.7–10.3)
Chloride: 102 mmol/L (ref 96–106)
Creatinine, Ser: 0.94 mg/dL (ref 0.57–1.00)
Globulin, Total: 2.5 g/dL (ref 1.5–4.5)
Glucose: 77 mg/dL (ref 70–99)
Potassium: 4.1 mmol/L (ref 3.5–5.2)
Sodium: 143 mmol/L (ref 134–144)
Total Protein: 7 g/dL (ref 6.0–8.5)
eGFR: 66 mL/min/{1.73_m2} (ref >59)

## 2022-06-26 LAB — HEMOGLOBIN A1C
Est. average glucose Bld gHb Est-mCnc: 100 mg/dL
Hgb A1c MFr Bld: 5.1 % (ref 4.8–5.6)

## 2022-06-26 LAB — LIPID PANEL
Chol/HDL Ratio: 1.9 ratio (ref 0.0–4.4)
Cholesterol, Total: 167 mg/dL (ref 100–199)
HDL: 86 mg/dL (ref >39)
LDL Chol Calc (NIH): 69 mg/dL (ref 0–99)
Triglycerides: 60 mg/dL (ref 0–149)
VLDL Cholesterol Cal: 12 mg/dL (ref 5–40)

## 2022-06-26 LAB — VITAMIN D 25 HYDROXY (VIT D DEFICIENCY, FRACTURES): Vit D, 25-Hydroxy: 97.8 ng/mL (ref 30.0–100.0)

## 2022-06-28 ENCOUNTER — Encounter (HOSPITAL_BASED_OUTPATIENT_CLINIC_OR_DEPARTMENT_OTHER): Payer: Self-pay | Admitting: Obstetrics & Gynecology

## 2022-07-13 ENCOUNTER — Encounter (HOSPITAL_BASED_OUTPATIENT_CLINIC_OR_DEPARTMENT_OTHER): Payer: Self-pay | Admitting: Obstetrics & Gynecology

## 2022-07-27 ENCOUNTER — Encounter: Payer: Self-pay | Admitting: Plastic Surgery

## 2022-07-27 ENCOUNTER — Ambulatory Visit: Payer: Medicare PPO | Admitting: Plastic Surgery

## 2022-07-27 DIAGNOSIS — D179 Benign lipomatous neoplasm, unspecified: Secondary | ICD-10-CM | POA: Insufficient documentation

## 2022-07-27 DIAGNOSIS — D171 Benign lipomatous neoplasm of skin and subcutaneous tissue of trunk: Secondary | ICD-10-CM | POA: Diagnosis not present

## 2022-07-27 NOTE — Progress Notes (Signed)
Patient ID: Raven Marks, female    DOB: Jan 21, 1956, 66 y.o.   MRN: [id number]   Chief Complaint  Patient presents with   Advice Only   Skin Problem    The patient is a 66 year old female here for evaluation of her back.  The patient states that she lost significant amount of weight over the past year.  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  She noticed a large mass on the right posterior thorax.  It is inferior to her scapula.  It is tender and aggravates her with laying down and with her bra.  She uses a cane but uses her left arm for that.  She does have vertigo and migraines.  Her last hemoglobin A1c was 5.1.  She is otherwise in good health.  The lesion is approximately 4 x 6 cm.  I can get my fingers around the base of it.  It is soft and movable and has the consistency of a lipoma.    Review of Systems  Constitutional:  Negative for activity change and appetite change.  Eyes: Negative.   Respiratory: Negative.  Negative for chest tightness.   Cardiovascular: Negative.   Gastrointestinal: Negative.   Endocrine: Negative.   Genitourinary: Negative.   Musculoskeletal: Negative.   Hematological: Negative.     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 Cornett, MD;  Location: [place];  Service: General;  Laterality: N/A;   ERCP N/A 10/12/2013   Procedure: ENDOSCOPIC RETROGRADE CHOLANGIOPANCREATOGRAPHY (ERCP);  Surgeon: Missy Sabins, MD;  Location: [facility] ENDOSCOPY;  Service: Endoscopy;  Laterality: N/A;   PARTIAL MASTECTOMY WITH AXILLARY SENTINEL LYMPH NODE BIOPSY Right 03/09/16   at [place] Left 2010   Dr. Maureen Ralphs   ROBOTIC ASSISTED TOTAL HYSTERECTOMY WITH BILATERAL SALPINGO OOPHERECTOMY  10/08/2010   Robotic, BSO, bilateral pelvic/ R periaortic LND      Current Outpatient Medications:    Acetaminophen 500 MG coapsule, Take 1,000 mg by mouth every 4 (four) hours as needed. , Disp: , Rfl:    ALPRAZolam (XANAX) 0.25 MG tablet, Take 1-2 tabs (0.'25mg'$ -0.'50mg'$ ) 30-60 minutes before procedure. May repeat if needed.Do not drive., Disp: 4 tablet, Rfl: 0   cetirizine (ZYRTEC) 5 MG tablet, Take 5 mg by mouth daily., Disp: , Rfl:    diclofenac sodium (VOLTAREN) 1 % GEL, as needed., Disp: , Rfl:    meclizine (ANTIVERT) 25 MG tablet, Take 25 mg by mouth every 6 (six) hours as needed for dizziness., Disp: , Rfl:    Propylene Glycol (SYSTANE COMPLETE OP),  Apply to eye in the morning, at noon, and at bedtime., Disp: , Rfl:    Objective:   Vitals:   07/27/22 1112  BP: (!) 152/72  Pulse: 65  SpO2: 98%    Physical Exam Vitals reviewed.  Constitutional:      Appearance: Normal appearance.  HENT:     Head: Normocephalic and atraumatic.  Cardiovascular:     Rate and Rhythm: Normal rate.     Pulses: Normal pulses.  Pulmonary:     Effort: Pulmonary effort is normal.  Abdominal:     General: There is no distension.     Palpations: Abdomen is soft.  Musculoskeletal:       Arms:  Skin:    General: Skin is warm.     Capillary Refill: Capillary refill takes less than 2 seconds.     Coloration: Skin is not jaundiced.      Findings: Lesion present. No bruising.  Neurological:     Mental Status: She is alert and oriented to person, place, and time.  Psychiatric:        Mood and Affect: Mood normal.        Behavior: Behavior normal.        Thought Content: Thought content normal.        Judgment: Judgment normal.     Assessment & Plan:  Lipoma of torso  Plan for excision of lipoma in the office.  I also talked to her about laser to the face.  She is a good candidate for BBL.  Pictures were obtained of the patient and placed in the chart with the patient's or guardian's permission.   [place], DO

## 2022-09-15 ENCOUNTER — Encounter (HOSPITAL_BASED_OUTPATIENT_CLINIC_OR_DEPARTMENT_OTHER): Payer: Self-pay | Admitting: Obstetrics & Gynecology

## 2022-10-19 ENCOUNTER — Other Ambulatory Visit (HOSPITAL_BASED_OUTPATIENT_CLINIC_OR_DEPARTMENT_OTHER): Payer: Self-pay | Admitting: Family Medicine

## 2022-10-19 ENCOUNTER — Encounter: Payer: Self-pay | Admitting: Plastic Surgery

## 2022-10-19 ENCOUNTER — Ambulatory Visit: Payer: Medicare PPO | Admitting: Plastic Surgery

## 2022-10-19 ENCOUNTER — Other Ambulatory Visit (HOSPITAL_COMMUNITY)
Admission: RE | Admit: 2022-10-19 | Discharge: 2022-10-19 | Disposition: A | Discharge status: Home / Self Care | Payer: Medicare PPO | Source: Ambulatory Visit | Attending: Plastic Surgery | Admitting: Plastic Surgery

## 2022-10-19 VITALS — BP 167/85 | HR 73

## 2022-10-19 DIAGNOSIS — D171 Benign lipomatous neoplasm of skin and subcutaneous tissue of trunk: Secondary | ICD-10-CM | POA: Diagnosis not present

## 2022-10-19 DIAGNOSIS — Z1231 Encounter for screening mammogram for malignant neoplasm of breast: Secondary | ICD-10-CM

## 2022-10-19 NOTE — Progress Notes (Addendum)
Procedure Note  Preoperative Dx: lipoma of back right side  Postoperative Dx: Same  Procedure: Excision of lipoma of back 4 x 6 cm  Anesthesia: Lidocaine 1% with 1:100,000 epinephrine  Indication for Procedure: lipoma  Description of Procedure: Risks and complications were explained to the patient.  Consent was confirmed and the patient understands the risks and benefits.  The potential complications and alternatives were explained and the patient consents.  The patient expressed understanding the option of not having the procedure and the risks of a scar.  Time out was called and all information was confirmed to be correct.    The area was prepped and drapped.  Lidocaine 1% with epinephrine was injected in the subcutaneous area.  After waiting several minutes for the local to take affect a #15 blade was used to incise the skin over the lesion.  Using light pressure, I was able to deliver the entire lipoma.  Hemostasis was achieved with a hand held cautery.   A 5-0 Monocryl was used to close the deep layers.  The skin edges were reapproximated with 5-0 Monocryl subcuticular running closure.  A dressing was applied.  The patient was given instructions on how to care for the area and a follow up appointment.  Raven Marks tolerated the procedure well and there were no complications. The specimen was sent to pathology.  Laser to lower lip with 10 and 560 nm.

## 2022-10-19 NOTE — Addendum Note (Signed)
Addended by: Wallace Going on: 10/19/2022 01:46 PM   Modules accepted: Orders

## 2022-10-21 LAB — SURGICAL PATHOLOGY

## 2022-10-26 ENCOUNTER — Telehealth: Payer: Self-pay | Admitting: *Deleted

## 2022-10-26 NOTE — Telephone Encounter (Signed)
Spoke with pt and let her know per Dr. Marla Roe the path showed lipoma. She asked if she should start rubbing anything on the incision. Advised her to wait to put anything on the incision sites until she has her f/u with Merry Proud on Friday. She verbalized understanding.

## 2022-10-26 NOTE — Telephone Encounter (Signed)
Call placed to pt per Dr. Eusebio Friendly request to notify of path results. NA/LVM

## 2022-10-29 ENCOUNTER — Ambulatory Visit: Payer: Medicare PPO | Admitting: Physician Assistant

## 2022-10-29 DIAGNOSIS — D171 Benign lipomatous neoplasm of skin and subcutaneous tissue of trunk: Secondary | ICD-10-CM

## 2022-10-29 NOTE — Progress Notes (Signed)
This is a 67 year old female seen in our office for follow-up evaluation status post excision of lipoma on her back on 10/19/2022 by Dr. Marla Roe.  Her pathology returned showing lipoma.  Since her last office visit she denies any complaints or concerns.  She notes she has been keeping her incision site clean.  At that time she also had BBL to her lower lip.  She notes that this did not improve the discoloration, she notes that it may be slightly enlarged the area.  Incisions clean dry and intact with dissolvable Monocryl stitches in place.  I did remove the tail of the Monocryl.  She has no surrounding redness swelling or warmth.  Photo take at todays visit   Overall she is doing well, her incision is healing as expected.  From a wound care standpoint she can bathe is normal, I did recommend Vaseline, she would like to use some other oils which I feel is fine at this time.  As far as her lip lesion goes she does not want to pursue any further BBL at this time.  I did offer to increase the energy level and attempt at treatment again, she will think about this and reach out to our office if she decides to move forward.  The patient was given strict turn precautions.  She verbalized understanding and agreement to today's plan.

## 2022-11-18 NOTE — Telephone Encounter (Signed)
Raven Marks is going to set up this appt. Just confirming it is no charge

## 2022-11-23 ENCOUNTER — Ambulatory Visit (INDEPENDENT_AMBULATORY_CARE_PROVIDER_SITE_OTHER): Payer: Self-pay | Admitting: Physician Assistant

## 2022-11-23 DIAGNOSIS — R238 Other skin changes: Secondary | ICD-10-CM

## 2022-11-23 NOTE — Progress Notes (Signed)
Preoperative Dx: Venous lake  Postoperative Dx:  same  Procedure: laser to lower lip   Anesthesia: none  Description of Procedure:  Risks and complications were explained to the patient. Consent was confirmed and signed. Time out was called and all information was confirmed to be correct. The area  area was wiped dry. The lip laser was set at 18 J/cm2 with 560 filter. The lip was lasered. The patient tolerated the procedure well and there were no complications. The patient is to follow up in 4 weeks.

## 2022-12-06 ENCOUNTER — Ambulatory Visit (HOSPITAL_BASED_OUTPATIENT_CLINIC_OR_DEPARTMENT_OTHER)
Admission: RE | Admit: 2022-12-06 | Discharge: 2022-12-06 | Disposition: A | Discharge status: Home / Self Care | Payer: Medicare PPO | Source: Ambulatory Visit | Attending: Family Medicine | Admitting: Family Medicine

## 2022-12-06 ENCOUNTER — Encounter (HOSPITAL_BASED_OUTPATIENT_CLINIC_OR_DEPARTMENT_OTHER): Payer: Self-pay | Admitting: Radiology

## 2022-12-06 DIAGNOSIS — Z1231 Encounter for screening mammogram for malignant neoplasm of breast: Secondary | ICD-10-CM | POA: Diagnosis not present

## 2022-12-20 ENCOUNTER — Ambulatory Visit (INDEPENDENT_AMBULATORY_CARE_PROVIDER_SITE_OTHER): Payer: Self-pay | Admitting: Physician Assistant

## 2022-12-20 DIAGNOSIS — R238 Other skin changes: Secondary | ICD-10-CM

## 2022-12-20 NOTE — Progress Notes (Signed)
Preoperative Dx: venous lake  Postoperative Dx:  same  Procedure: laser to lower lip   Anesthesia: none  Description of Procedure:  Risks and complications were explained to the patient. Consent was confirmed and signed. Time out was called and all information was confirmed to be correct. The area  area was prepped with alcohol and wiped dry. The [place]  laser was set at 20 J/cm2 with 560 filter. The lower lip was lasered. The patient tolerated the procedure well and there were no complications. The patient is to follow up in 4 weeks.

## 2023-01-17 ENCOUNTER — Ambulatory Visit (INDEPENDENT_AMBULATORY_CARE_PROVIDER_SITE_OTHER): Payer: Self-pay | Admitting: Physician Assistant

## 2023-01-17 DIAGNOSIS — R238 Other skin changes: Secondary | ICD-10-CM

## 2023-01-17 NOTE — Progress Notes (Signed)
Preoperative Dx: venous lake  Postoperative Dx:  same  Procedure: laser to lower lip   Anesthesia: none  Description of Procedure:  Risks and complications were explained to the patient. Consent was confirmed and signed. Time out was called and all information was confirmed to be correct. The area  area was prepped with alcohol and wiped dry. The [place] laser was set at 22 J/cm2. The lower lip was lasered. The patient tolerated the procedure well and there were no complications. The patient is to follow up in 4 weeks.

## 2023-02-14 ENCOUNTER — Other Ambulatory Visit: Payer: Medicare PPO | Admitting: Physician Assistant

## 2023-02-14 ENCOUNTER — Ambulatory Visit: Payer: Medicare PPO | Admitting: Physician Assistant

## 2023-02-22 ENCOUNTER — Ambulatory Visit (INDEPENDENT_AMBULATORY_CARE_PROVIDER_SITE_OTHER): Payer: Medicare PPO | Admitting: Physician Assistant

## 2023-02-22 DIAGNOSIS — R238 Other skin changes: Secondary | ICD-10-CM

## 2023-02-22 NOTE — Progress Notes (Signed)
Preoperative Dx: venous lake   Postoperative Dx:  same   Procedure: laser to lower lip    Anesthesia: none   Description of Procedure:  Risks and complications were explained to the patient. Consent was confirmed and signed. Time out was called and all information was confirmed to be correct. The area  area was prepped with alcohol and wiped dry. The [place] laser was set at 24 J/cm2 with 560 filter. The lower lip was lasered. The patient tolerated the procedure well and there were no complications. The patient is to follow up in 4 weeks.

## 2023-03-23 ENCOUNTER — Ambulatory Visit (INDEPENDENT_AMBULATORY_CARE_PROVIDER_SITE_OTHER): Payer: Self-pay | Admitting: Physician Assistant

## 2023-03-23 DIAGNOSIS — R238 Other skin changes: Secondary | ICD-10-CM

## 2023-03-23 NOTE — Progress Notes (Signed)
Preoperative Dx: Venous lake  Postoperative Dx:  same  Procedure: laser to lower lip  Anesthesia: none  Description of Procedure:  Risks and complications were explained to the patient. Consent was confirmed and signed. Time out was called and all information was confirmed to be correct. The area  area was prepped with alcohol and wiped dry. The [place] laser was set at 24 J/cm2. The lower lip was lasered. The patient tolerated the procedure well and there were no complications. The patient is to follow up in 4 weeks.

## 2023-04-26 ENCOUNTER — Encounter (HOSPITAL_BASED_OUTPATIENT_CLINIC_OR_DEPARTMENT_OTHER): Payer: Self-pay | Admitting: Obstetrics & Gynecology

## 2023-04-26 ENCOUNTER — Encounter (INDEPENDENT_AMBULATORY_CARE_PROVIDER_SITE_OTHER): Payer: Self-pay

## 2023-06-07 ENCOUNTER — Encounter (HOSPITAL_BASED_OUTPATIENT_CLINIC_OR_DEPARTMENT_OTHER): Payer: Self-pay | Admitting: Obstetrics & Gynecology

## 2023-07-18 ENCOUNTER — Ambulatory Visit (HOSPITAL_BASED_OUTPATIENT_CLINIC_OR_DEPARTMENT_OTHER): Payer: Medicare PPO | Admitting: Obstetrics & Gynecology

## 2023-07-22 IMAGING — MG MM DIGITAL SCREENING BILAT W/ TOMO AND CAD
8 series · 8 of 24 positions shown · non-contrast
Comparison: Previous exam(s).

CLINICAL DATA: Screening.

EXAM:
DIGITAL SCREENING BILATERAL MAMMOGRAM WITH TOMOSYNTHESIS AND CAD
TECHNIQUE: Bilateral screening digital craniocaudal and mediolateral oblique
mammograms were obtained. Bilateral screening digital breast
tomosynthesis was performed. The images were evaluated with
computer-aided detection.

[R CC synth-2D]
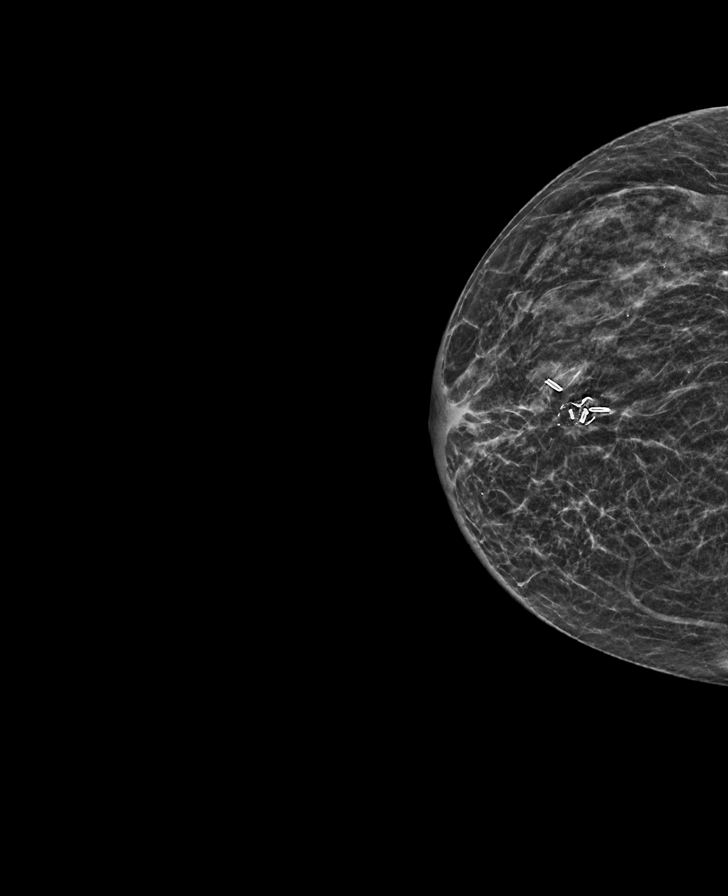

[L CC synth-2D]
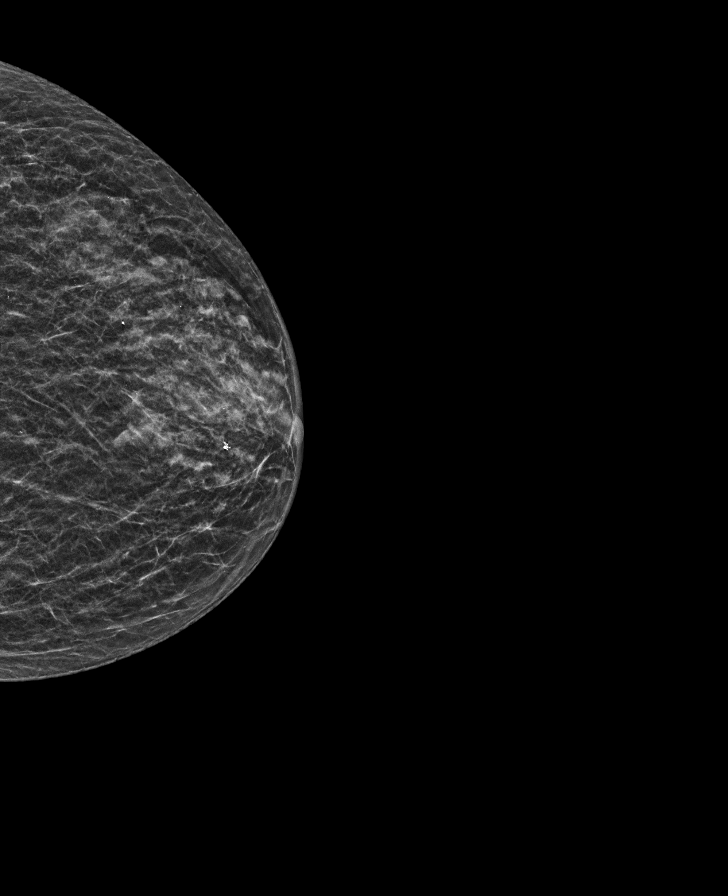

[R MLO synth-2D]
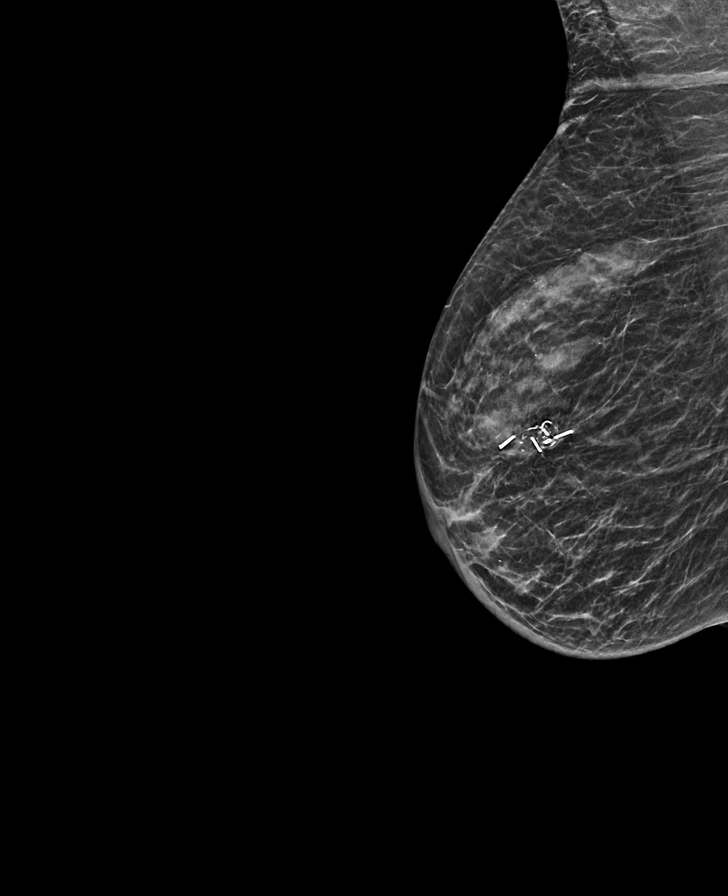

[L MLO synth-2D]
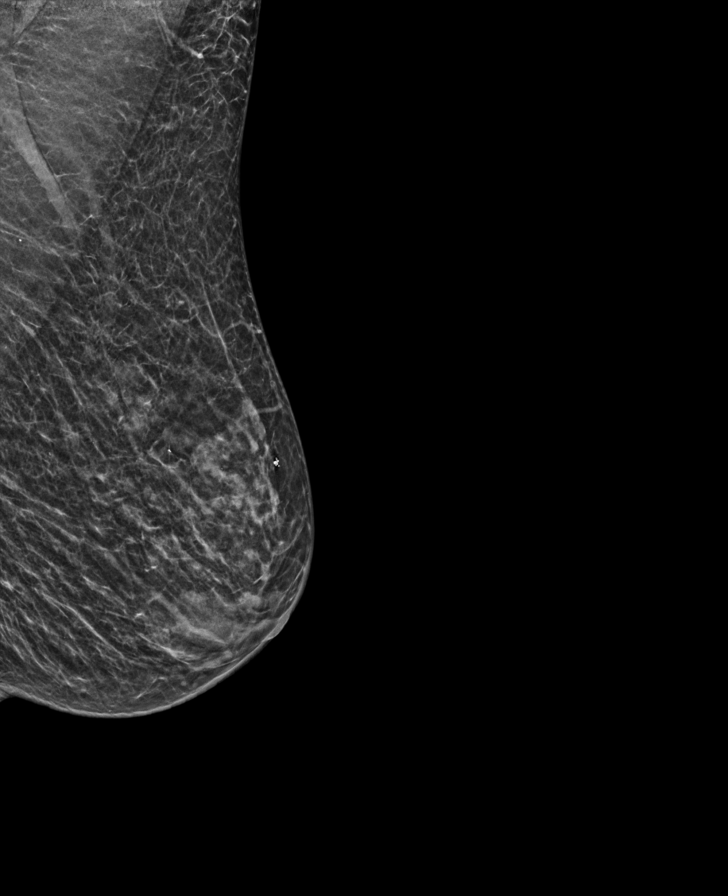

[L MLO tomo · tomo slice 19/37.0]
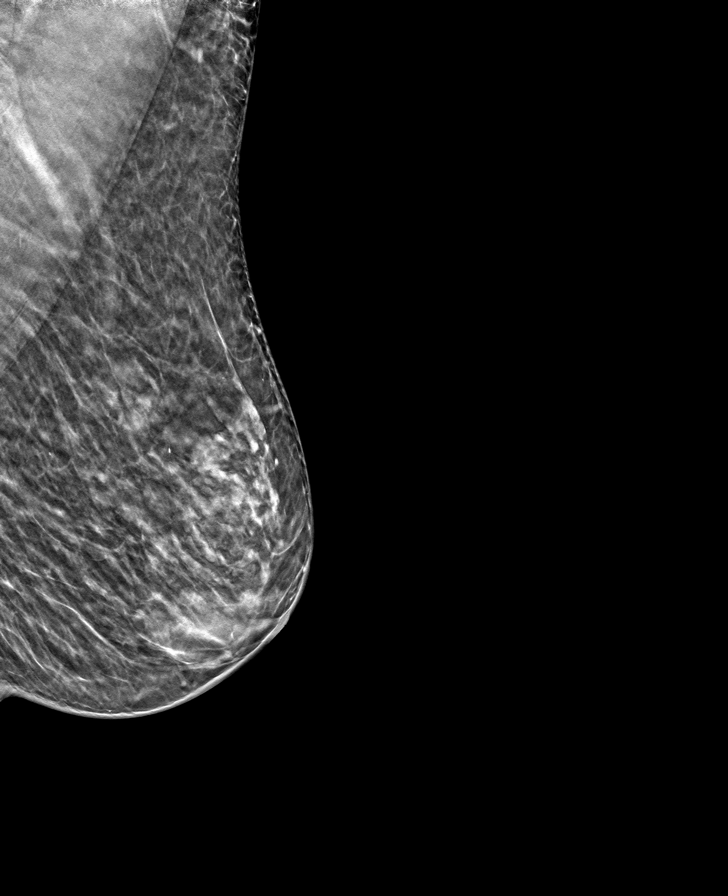

[L CC tomo · tomo slice 18/35.0]
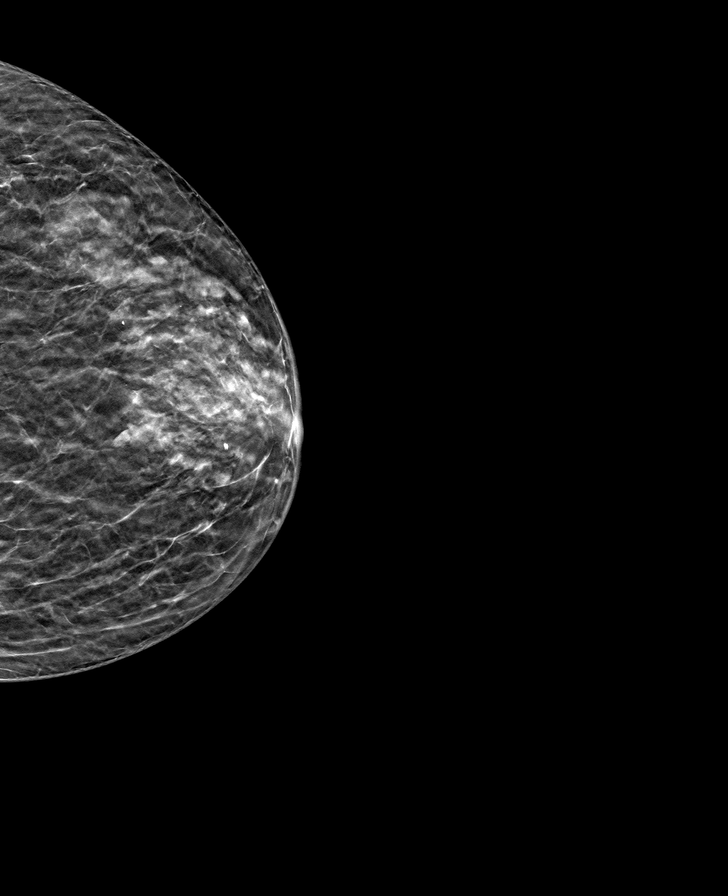

[R MLO tomo · tomo slice 22/43.0]
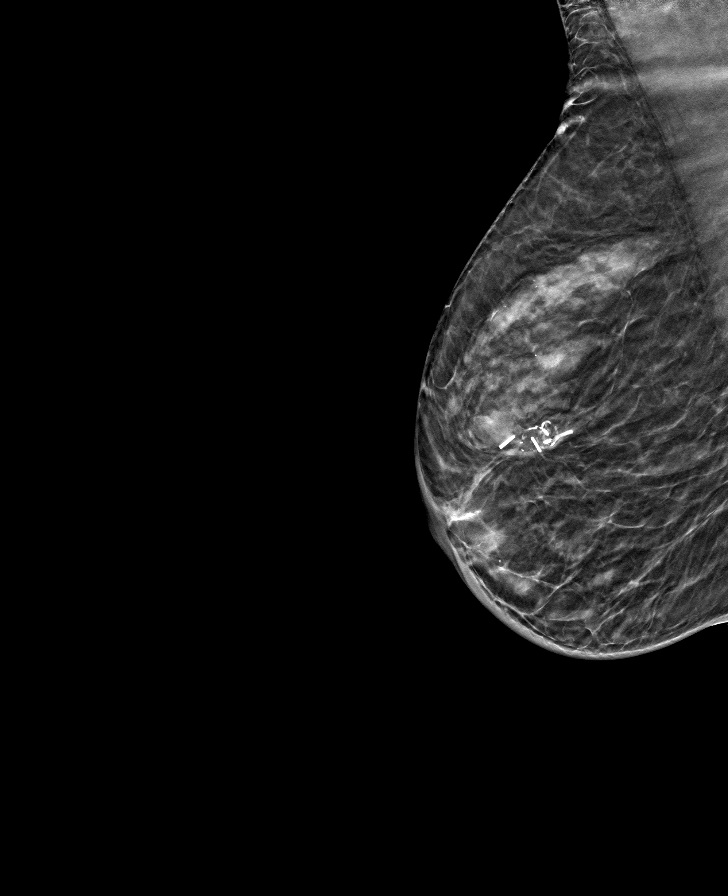

[R CC tomo · tomo slice 20/39.0]
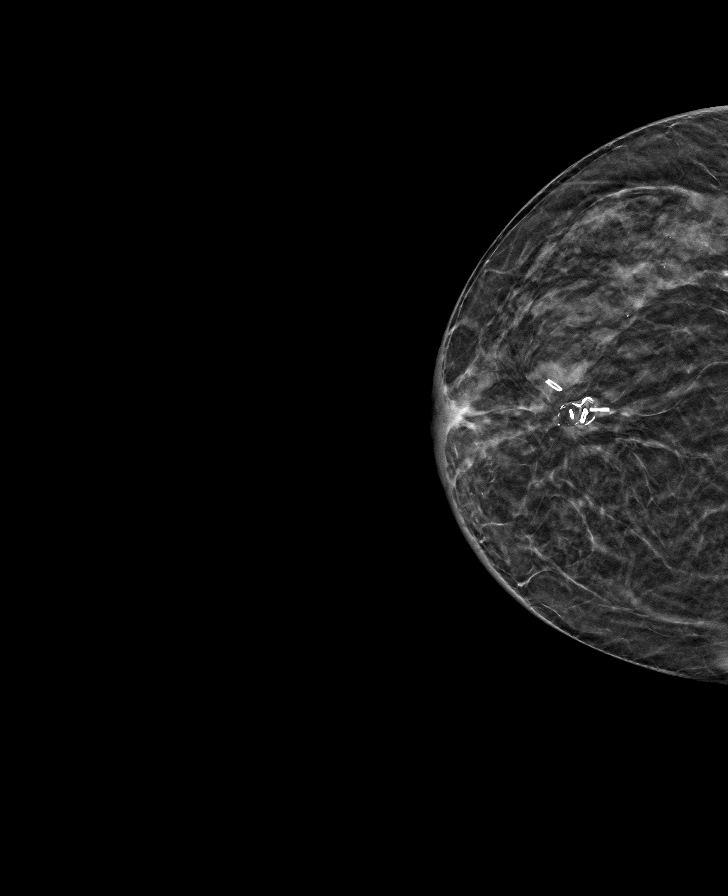

[8 of 24 positions shown; findings below may reference images not displayed]

ACR Breast Density Category b: There are scattered areas of
fibroglandular density.
FINDINGS: There are no findings suspicious for malignancy. Surgical changes in
the RIGHT breast identified.
IMPRESSION: No mammographic evidence of malignancy. A result letter of this
screening mammogram will be mailed directly to the patient.

RECOMMENDATION:
Screening mammogram in one year. (Code:EH-[id number])

BI-RADS CATEGORY  2: Benign.

## 2023-08-03 ENCOUNTER — Encounter (HOSPITAL_BASED_OUTPATIENT_CLINIC_OR_DEPARTMENT_OTHER): Payer: Self-pay | Admitting: Obstetrics & Gynecology

## 2023-08-03 ENCOUNTER — Ambulatory Visit (HOSPITAL_BASED_OUTPATIENT_CLINIC_OR_DEPARTMENT_OTHER): Payer: Medicare PPO | Admitting: Obstetrics & Gynecology

## 2023-08-03 VITALS — BP 139/63 | HR 59 | Ht 64.0 in | Wt 133.4 lb

## 2023-08-03 DIAGNOSIS — C541 Malignant neoplasm of endometrium: Secondary | ICD-10-CM | POA: Diagnosis not present

## 2023-08-03 DIAGNOSIS — Z9189 Other specified personal risk factors, not elsewhere classified: Secondary | ICD-10-CM

## 2023-08-03 DIAGNOSIS — L292 Pruritus vulvae: Secondary | ICD-10-CM

## 2023-08-03 DIAGNOSIS — Z170 Estrogen receptor positive status [ER+]: Secondary | ICD-10-CM

## 2023-08-03 DIAGNOSIS — C50111 Malignant neoplasm of central portion of right female breast: Secondary | ICD-10-CM | POA: Diagnosis not present

## 2023-08-03 MED ORDER — TRIAMCINOLONE ACETONIDE 0.5 % EX OINT: 1.0000 | TOPICAL_OINTMENT | Freq: Two times a day (BID) | CUTANEOUS | 0 refills | Status: AC

## 2023-08-03 NOTE — Progress Notes (Signed)
67 y.o. G1P1 Married White or Caucasian female here for breast and pelvic exam.  I am also following her for history of endometrial adenocarcinoma and breast cancer.  Endometrial cancer surgery was 09/2010 and breast cancer was treated at [facility] 02/2016.  Denies vaginal bleeding.  Patient's last menstrual period was 09/27/2010.          Sexually active: No.  H/O STD:  no  Health Maintenance: PCP:  Dr. Tenny Craw.  Last wellness appt was this year.  Did blood work at that appt:  yes Vaccines are up to date:  has not done pneumovax, shingrix and updated Covid vaccine Colonoscopy:  11/21/2018 MMG:  12/06/2022 Negative BMD:  07/02/2014.  Declines. Last pap smear:  11/29/2019 Negative.   H/o abnormal pap smear:  no    reports that she has never smoked. She has never used smokeless tobacco. She reports that she does not drink alcohol and does not use drugs.  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 Cornett, MD;  Location: [facility] OR;  Service: General;  Laterality: N/A;   ERCP N/A 10/12/2013   Procedure: ENDOSCOPIC RETROGRADE CHOLANGIOPANCREATOGRAPHY (ERCP);  Surgeon: Barrie Folk, MD;  Location:  [facility] ENDOSCOPY;  Service: Endoscopy;  Laterality: N/A;   PARTIAL MASTECTOMY WITH AXILLARY SENTINEL LYMPH NODE BIOPSY Right 03/09/2016   at [facility]   REPLACEMENT TOTAL KNEE Left 2010   Dr. Despina Hick   ROBOTIC ASSISTED TOTAL HYSTERECTOMY WITH BILATERAL SALPINGO OOPHERECTOMY  10/08/2010   Robotic, BSO, bilateral pelvic/ R periaortic LND    Current Outpatient Medications  Medication Sig Dispense Refill   Acetaminophen 500 MG coapsule Take 1,000 mg by mouth every 4 (four) hours as needed.      ALPRAZolam (XANAX) 0.25 MG tablet Take 1-2 tabs (0.25mg -0.50mg ) 30-60 minutes before procedure. May repeat if needed.Do not drive. 4 tablet 0   cetirizine (ZYRTEC) 5 MG tablet Take 5 mg by mouth daily.     diclofenac sodium (VOLTAREN) 1 % GEL as needed.     meclizine (ANTIVERT) 25 MG tablet Take 25 mg by mouth every 6 (six) hours as needed for dizziness.     Propylene Glycol (SYSTANE COMPLETE OP) Apply to eye in the morning, at noon, and at bedtime.     No current facility-administered medications for this visit.    Family History  Problem Relation Age of Onset   Colon cancer Mother    Colon cancer Father    Asthma Paternal Grandmother    Breast cancer Maternal Grandmother    Breast cancer Maternal Aunt    Uterine cancer Sister  Diabetes Sister    Migraines Neg Hx     Review of Systems  Constitutional: Negative.   Genitourinary: Negative.     Exam:   BP 139/63 (BP Location: Left Arm, Patient Position: Sitting, Cuff Size: Large)   Pulse (!) 59   Ht 5\' 4"  (1.626 m) Comment: Reported  Wt 133 lb 6.4 oz (60.5 kg)   LMP 09/27/2010   BMI 22.90 kg/m   Height: 5\' 4"  (162.6 cm) (Reported)  General appearance: alert, cooperative and appears stated age Breasts:  right breast scars well healed, no masses, no skin changes, no LAD, no nipple discharge Abdomen: soft, non-tender; bowel sounds normal; no masses,  no organomegaly Lymph nodes: Cervical, supraclavicular, and axillary nodes normal.   No abnormal inguinal nodes palpated Neurologic: Grossly normal  Pelvic: External genitalia:  erythematous area on right lateral labia majora measuring 2.5cm              Urethra:  normal appearing urethra with no masses, tenderness or lesions              Bartholins and Skenes: normal                 Vagina: normal appearing vagina with atrophic changes and no discharge, no lesions              Cervix: absent              Pap taken: No. Bimanual Exam:  Uterus:  uterus absent              Adnexa: no mass, fullness, tenderness               Rectovaginal: Confirms               Anus:  normal sphincter tone, no lesions  Chaperone, Ina Homes, CMA, was present for exam.  Assessment/Plan: 1. GYN exam for high-risk Medicare patient - Pap smear not indicated - Mammogram 12/06/2022 - Colonoscopy 11/21/2018, follow up 5 - Bone mineral density declined.  Discussed with pt today.  She would not take any medication for treatment at this time.  Not ordered. - lab work done with PCP, Dr. Tenny Craw - vaccines reviewed/updated.  Feel she should have pneumovax this year.   2. Endometrial adenocarcinoma (HCC) - exam normal  3. Malignant neoplasm of central portion of right breast in female, estrogen receptor positive (HCC) - exam stable today  4. Vulvar itching - pt will try topical triamcinolone ointment 0.5% twice daily for a week.  If resolves symptoms, she can use prn.  If symptoms persist, will plan vulvar biopsy

## 2023-08-03 NOTE — Patient Instructions (Signed)
Pneumovax vaccination.  You have already done Prevnar 20.

## 2023-08-17 ENCOUNTER — Encounter (HOSPITAL_BASED_OUTPATIENT_CLINIC_OR_DEPARTMENT_OTHER): Payer: Self-pay | Admitting: Obstetrics & Gynecology

## 2023-08-18 ENCOUNTER — Ambulatory Visit (HOSPITAL_BASED_OUTPATIENT_CLINIC_OR_DEPARTMENT_OTHER): Payer: Medicare PPO | Admitting: Obstetrics & Gynecology

## 2023-08-18 ENCOUNTER — Encounter (HOSPITAL_BASED_OUTPATIENT_CLINIC_OR_DEPARTMENT_OTHER): Payer: Self-pay | Admitting: Obstetrics & Gynecology

## 2023-08-18 VITALS — BP 142/79 | HR 68 | Ht 64.0 in | Wt 129.8 lb

## 2023-08-18 DIAGNOSIS — N649 Disorder of breast, unspecified: Secondary | ICD-10-CM

## 2023-08-18 NOTE — Progress Notes (Signed)
GYNECOLOGY  VISIT  CC:   new breast lesion  HPI: 67 year old G1P1 MWF here for problem visit with a history of breast cancer, presents with a new lesion on the right breast, noticed three days ago. The lesion is described as a raised area, similar to an 'under skin pimple,' located at the incision site from a previous cancer surgery. The patient denies any associated pain or nipple discharge. The patient's last mammogram was on March 12th.     H/O breast cancer in 2017 that was stage IA and was a very small lesion.  She doesn't think this feels the same but was nervous about it.   MEDS:   Current Outpatient Medications on File Prior to Visit  Medication Sig Dispense Refill   Acetaminophen 500 MG coapsule Take 1,000 mg by mouth every 4 (four) hours as needed.      ALPRAZolam (XANAX) 0.25 MG tablet Take 1-2 tabs (0.25mg -0.50mg ) 30-60 minutes before procedure. May repeat if needed.Do not drive. 4 tablet 0   cetirizine (ZYRTEC) 5 MG tablet Take 5 mg by mouth daily.     diclofenac sodium (VOLTAREN) 1 % GEL as needed.     meclizine (ANTIVERT) 25 MG tablet Take 25 mg by mouth every 6 (six) hours as needed for dizziness.     Propylene Glycol (SYSTANE COMPLETE OP) Apply to eye in the morning, at noon, and at bedtime.     triamcinolone ointment (KENALOG) 0.5 % Apply 1 Application topically 2 (two) times daily. Can use for up to 7 days. 30 g 0   No current facility-administered medications on file prior to visit.    ALLERGIES: Codeine, Hydromorphone, Morphine, and Penicillins  SH:  married, non smoker  Review of Systems  Constitutional: Negative.   Genitourinary: Negative.     PHYSICAL EXAMINATION:    BP (!) 142/79 (BP Location: Right Arm, Patient Position: Sitting, Cuff Size: Large)   Pulse 68   Ht 5\' 4"  (1.626 m) Comment: Reported  Wt 129 lb 12.8 oz (58.9 kg)   LMP 09/27/2010   BMI 22.28 kg/m     Physical Exam Constitutional:      Appearance: Normal appearance.  Chest:   Breasts:    Right: No inverted nipple, nipple discharge or tenderness.    Neurological:     General: No focal deficit present.     Mental Status: She is alert.  Psychiatric:        Mood and Affect: Mood normal.        Behavior: Behavior normal.      Assessment/Plan: 1. Breast lesion - pt knows I think this is a skin issue and not a breast issue but given her prior hx of breast cancer, I do not think it is unreasonable just to remove if returns.  Pt is going to watch and give me an update if changes at all.  She is comfortable with this plan.

## 2023-10-25 ENCOUNTER — Other Ambulatory Visit (HOSPITAL_BASED_OUTPATIENT_CLINIC_OR_DEPARTMENT_OTHER): Payer: Self-pay | Admitting: Obstetrics & Gynecology

## 2023-10-25 DIAGNOSIS — Z1231 Encounter for screening mammogram for malignant neoplasm of breast: Secondary | ICD-10-CM

## 2023-12-07 ENCOUNTER — Ambulatory Visit (HOSPITAL_BASED_OUTPATIENT_CLINIC_OR_DEPARTMENT_OTHER): Payer: Medicare PPO | Admitting: Radiology

## 2023-12-09 ENCOUNTER — Encounter (HOSPITAL_BASED_OUTPATIENT_CLINIC_OR_DEPARTMENT_OTHER): Payer: Self-pay | Admitting: Radiology

## 2023-12-09 ENCOUNTER — Ambulatory Visit (HOSPITAL_BASED_OUTPATIENT_CLINIC_OR_DEPARTMENT_OTHER)
Admission: RE | Admit: 2023-12-09 | Discharge: 2023-12-09 | Disposition: A | Discharge status: Home / Self Care | Payer: Medicare PPO | Source: Ambulatory Visit | Attending: Obstetrics & Gynecology | Admitting: Obstetrics & Gynecology

## 2023-12-09 DIAGNOSIS — Z1231 Encounter for screening mammogram for malignant neoplasm of breast: Secondary | ICD-10-CM | POA: Insufficient documentation

## 2024-05-08 ENCOUNTER — Encounter: Payer: Self-pay | Admitting: Physical Therapy

## 2024-05-08 ENCOUNTER — Ambulatory Visit: Attending: Family Medicine | Admitting: Physical Therapy

## 2024-05-08 ENCOUNTER — Other Ambulatory Visit: Payer: Self-pay

## 2024-05-08 DIAGNOSIS — R2681 Unsteadiness on feet: Secondary | ICD-10-CM | POA: Diagnosis not present

## 2024-05-08 DIAGNOSIS — R42 Dizziness and giddiness: Secondary | ICD-10-CM | POA: Diagnosis not present

## 2024-05-08 NOTE — Therapy (Signed)
 OUTPATIENT PHYSICAL THERAPY VESTIBULAR EVALUATION     Patient Name: Raven Marks MRN: [id number] DOB:18-Dec-1955, 68 y.o., female Today's Date: 05/08/2024  END OF SESSION:  PT End of Session - 05/08/24 1235     Visit Number 1    Number of Visits 9    Date for PT Re-Evaluation 06/08/24    Authorization Type [employer]    PT Start Time 1018    PT Stop Time 1100    PT Time Calculation (min) 42 min    Activity Tolerance Patient tolerated treatment well    Behavior During Therapy WFL for tasks assessed/performed          Past Medical History:  Diagnosis Date   Cancer of central portion of right female breast (HCC) 01/08/2016   stage 1A   Complication of anesthesia    vertigo   Congenital absence of femur    right   Endometrioid adenocarcinoma    Stage IA, grade 2   GERD (gastroesophageal reflux disease)    History of external beam radiation therapy 10/27/16-12/24/16   right breast 50.4 Gy in 28 fracitons, boost 10 Gy in 5 fractions   PONV (postoperative nausea and vomiting)    Presence of artificial leg    Right, 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 Cornett, MD;  Location: [facility] OR;  Service: General;  Laterality: N/A;   ERCP N/A 10/12/2013   Procedure: ENDOSCOPIC RETROGRADE CHOLANGIOPANCREATOGRAPHY (ERCP);  Surgeon: Norleen JAYSON Hint, MD;  Location: [facility] ENDOSCOPY;  Service: Endoscopy;  Laterality: N/A;   PARTIAL MASTECTOMY WITH AXILLARY SENTINEL LYMPH NODE BIOPSY Right 03/09/2016   at [facility]   REPLACEMENT TOTAL KNEE Left 2010   Dr. Hiram   ROBOTIC ASSISTED TOTAL HYSTERECTOMY WITH BILATERAL SALPINGO OOPHERECTOMY  10/08/2010   Robotic, BSO, bilateral pelvic/ R periaortic LND    Patient Active Problem List   Diagnosis Date Noted   Lipoma 07/27/2022   Vertigo 01/24/2020   Vestibular migraine 01/24/2020   Chronic migraine without aura without status migrainosus, not intractable 01/24/2020   BPPV (benign paroxysmal positional vertigo) 04/20/2018   Genetic testing 04/14/2016   Malignant neoplasm of central portion of right female breast (HCC) 01/08/2016   Endometrial adenocarcinoma (HCC) 03/16/2012    PCP: Okey Carlin Redbird, MD  REFERRING PROVIDER: Okey Carlin Redbird, MD   REFERRING DIAG: R42 (ICD-10-CM) - Vertigo   THERAPY DIAG:  Dizziness and giddiness  Unsteadiness on feet  ONSET DATE: 03/07/2024 MD referral  Rationale for Evaluation and Treatment: Rehabilitation  SUBJECTIVE:   SUBJECTIVE STATEMENT: Went for about 2 years and the dizziness has flared back up.  I'm not spinning, but I feel lightheaded.   Pt accompanied by: self  PERTINENT HISTORY: anxiety, spinal stenosis, RLE prosthesis due to birth defect - congenital absence of R femur, R breast CA treated with chemo and radiation, endometrioid adenocarcinoma, Left TKA and hx of neck pain   PAIN:  Are you having pain? No  PRECAUTIONS: Other: hx of R prosthesis, above knee and uses cane  RED FLAGS: None   WEIGHT BEARING RESTRICTIONS: No  FALLS: Has patient fallen in last 6 months? No  LIVING ENVIRONMENT: Lives with: lives with their family Lives in: House/apartment Stairs:  Has following equipment at home: Single point cane  PLOF: Independent with household mobility with device and Independent with community mobility with device  PATIENT GOALS: to get rid of dizziness  OBJECTIVE:  Note: Objective measures were completed at Evaluation unless otherwise noted.  DIAGNOSTIC FINDINGS: NA for this episode  COGNITION: Overall cognitive status: Within functional limits for tasks assessed   POSTURE:  rounded shoulders, forward head, and weight shift left  Cervical ROM:    Active  A/PROM (deg) eval  Flexion 35  Extension 40  Right lateral flexion   Left lateral flexion   Right rotation 45  Left rotation 45  (Blank rows = not tested)   BED MOBILITY:  Independent  TRANSFERS: Assistive device utilized: None  Sit to stand: Modified independence Stand to sit: Modified independence  GAIT: Gait pattern: step through pattern and wide BOS Distance walked: clinic distances Assistive device utilized: Single point cane Level of assistance: Modified independence Comments: Reports she would like to look into new above knee prosthesis due to fitting issues since significant weight loss   PATIENT SURVEYS:  DHI: THE DIZZINESS HANDICAP INVENTORY (DHI)  P1. Does looking up increase your problem? 2 = Sometimes  E2. Because of your problem, do you feel frustrated? 2 = Sometimes  F3. Because of your problem, do you restrict your travel for business or recreation?  2 = Sometimes  P4. Does walking down the aisle of a supermarket increase your problems?  2 = Sometimes  F5. Because of your problem, do you have difficulty getting into or out of bed?  0 = No  F6. Does your problem significantly restrict your participation in social activities, such as going out to dinner, going to the movies, dancing, or going to parties? 2 = Sometimes  F7. Because of your problem, do you have difficulty reading?  0 = No  P8. Does performing more ambitious activities such as sports, dancing, household chores (sweeping or putting dishes away) increase your problems?  0 = No  E9. Because of your problem, are you afraid to leave your home without having without having someone accompany you?  2 = Sometimes  E10. Because of your problem have you been embarrassed in front of others?  2 = Sometimes  P11. Do quick movements of your head increase your problem?  4 = Yes  F12. Because of your problem, do you avoid heights?  2 = Sometimes  P13. Does turning over in bed increase your problem?  4 = Yes  F14.  Because of your problem, is it difficult for you to do strenuous homework or yard work? 2 = Sometimes  E15. Because of your problem, are you afraid people may think you are intoxicated? 0 = No  F16. Because of your problem, is it difficult for you to go for a walk by yourself?  0 = No  P17. Does walking down a sidewalk increase your problem?  0 = No  E18.Because of your problem, is it difficult for you to concentrate 0 = No  F19. Because of your problem, is it difficult for you to walk around your house in the dark? 0 = No  E20. Because of your problem, are you afraid to stay home alone?  0 = No  E21. Because of your problem, do you feel handicapped? 0 = No  E22. Has the problem placed stress on your relationships with members of your family or friends? 0 = No  E23. Because of your problem, are you  depressed?  0 = No  F24. Does your problem interfere with your job or household responsibilities?  0 = No  P25. Does bending over increase your problem?  2 = Sometimes  TOTAL 28    DHI Scoring Instructions  The patient is asked to answer each question as it pertains to dizziness or unsteadiness problems, specifically  considering their condition during the last month. Questions are designed to incorporate functional (F), physical  (P), and emotional (E) impacts on disability.   Scores greater than 10 points should be referred to balance specialists for further evaluation.   16-34 Points (mild handicap)  36-52 Points (moderate handicap)  54+ Points (severe handicap)  Minimally Detectable Change: 17 points ([address] [place], 1990)  [place], G. SHAUNNA. and McRae, C. W. (1990). The development of the Dizziness Handicap Inventory. Archives of Otolaryngology - Head and Neck Surgery 116(4): [id number].   VESTIBULAR ASSESSMENT:  GENERAL OBSERVATION: No acute distress, but pt does report being anxious   SYMPTOM BEHAVIOR:  Subjective history: Watching things scrolling and turning head quickly.  I  have had vertigo in the past, and it was on the right side.  I try not to lie on the right side.  I know my limits. Has hx of migraines, has hx of tinnitus (longstanding)  Non-Vestibular symptoms: neck pain and migraine symptoms  Type of dizziness: Imbalance (Disequilibrium), Unsteady with head/body turns, Lightheadedness/Faint, and sometimes a quick spinning  Frequency: occasional-weather related  Duration: quick spinning; the lightheadedness/full feeling lasts longer  Aggravating factors: Induced by motion: turning head quickly and scrolling on a screen rolling to R, weather-overcast and rainy  Relieving factors: head stationary, closing eyes, and slow movements  Progression of symptoms: better  OCULOMOTOR EXAM:  Wears distance glasses, wears reading glasses; cannot do transition lenses  Ocular Alignment: normal  Ocular ROM: No Limitations  Spontaneous Nystagmus: absent  Gaze-Induced Nystagmus: absent  Smooth Pursuits: intact  Saccades: intact  Convergence/Divergence: 2 cm   VESTIBULAR - OCULAR REFLEX:   Slow VOR: Normal, no symptoms  VOR Cancellation: Normal  Head-Impulse Test: HIT Right: negative HIT Left: negative  Dynamic Visual Acuity: NT   POSITIONAL TESTING: Right Dix-Hallpike: no nystagmus Left Dix-Hallpike: no nystagmus Right Roll Test: NT (due to pt's prosthesis and she typically does not lie on R side) Left Roll Test: no nystagmus  MOTION SENSITIVITY:  NOT TEST AT EVAL  Motion Sensitivity Quotient Intensity: 0 = none, 1 = Lightheaded, 2 = Mild, 3 = Moderate, 4 = Severe, 5 = Vomiting  Intensity  1. Sitting to supine   2. Supine to L side   3. Supine to R side   4. Supine to sitting   5. L Hallpike-Dix   6. Up from L    7. R Hallpike-Dix   8. Up from R    9. Sitting, head tipped to L knee   10. Head up from L knee   11. Sitting, head tipped to R knee   12. Head up from R knee   13. Sitting head turns x5   14.Sitting head nods x5   15. In stance, 180 turn to  L    16. In stance, 180 turn to R       M-CTSIB  Condition 1: Firm Surface, EO 30 Sec, Normal Sway  Condition 2: Firm Surface, EC 30 Sec, Mild Sway (increased weightshift to LLE)  Condition 3: Foam Surface, EO NT Sec, NT Sway  Condition 4: Foam Surface, EC NT Sec, NT  Sway                                                                                                                               TREATMENT DATE: 05/08/2024   PATIENT EDUCATION: Education details: Eval results, POC; reviewed some of her previous HEP (from bout of therapy 5 years ago)-pt has resumed doing these and they would be appropriate  Person educated: Patient Education method: Explanation Education comprehension: verbalized understanding  HOME EXERCISE PROGRAM: Pt has previous HEP-she is doing standing VOR (advised to try on busy background), okay to bring symptoms up to 3-4/10 and then let them settle;  Also she has done neck stretches and she is comfortable to resume these  GOALS: Goals reviewed with patient? Yes  SHORT TERM GOALS: = LONG TERM GOALS: Target date: 06/08/2024  Pt will be independent with HEP for improved dizziness, balance. Baseline:  Goal status: INITIAL  2.  Pt to report 0/10 dizziness with bed mobility. Baseline:  Goal status: INITIAL  3.  DHI score to improve to 18 or less for improved dizziness in daily activities. Baseline: 28 Goal status: INITIAL  4.  FGA score to be assessed and goal to be set as appropriate. Baseline: TBA Goal status: INITIAL  ASSESSMENT:  CLINICAL IMPRESSION: Patient is a 68 y.o. female who was seen today for physical therapy evaluation and treatment for vertigo.  She has history of vertigo in the past.  She reports onset of lightheadedness and dizziness again in June, after about a 2 year time of no symptoms.  She reports very brief spinning episodes (sometimes with getting up from bed, sometimes with quick head turns); otherwise, she reports more  lightheaded/full sensation in her head, which she feels happens more with cloudy, rainy weather.  She presents today with oculomotor testing WFL and VOR testing WFL (asymptomatic).  Negative VOR; assessed positional vertigo, and pt is negative for nystagmus in R DH and L DH and L roll position.  (Pt does appear guarded going into the [facility] positions).  Did not test R roll, with pt wearing her R above knee prosthesis (she does appear fearful of trying this side, as she notes this is the position she avoids at home).  She has previous exercises from a previous bout of vestibular therapy and is agreeable to trying to start them again.  Will likely need to reassess positional vertigo and assess high level balance/unsteadiness to address as appropriate.   OBJECTIVE IMPAIRMENTS: Abnormal gait, decreased balance, dizziness, and impaired flexibility.   ACTIVITY LIMITATIONS: bending, bed mobility, and locomotion level  PARTICIPATION LIMITATIONS: meal prep, cleaning, laundry, driving, shopping, and community activity  PERSONAL FACTORS: 3+ comorbidities: see above are also affecting patient's functional outcome.   REHAB POTENTIAL: Good  CLINICAL DECISION MAKING: Stable/uncomplicated  EVALUATION COMPLEXITY: Low   PLAN:  PT FREQUENCY: 1-2x/week  PT DURATION: 4 weeks plus eval visit  PLANNED INTERVENTIONS: 97750- Physical Performance Testing, 97110-Therapeutic exercises, 97530- Therapeutic  activity, [id number]- Neuromuscular re-education, [phone]- Self Care, [phone]- Manual therapy, [phone]- Gait training, Patient/Family education, Balance training, and Vestibular training  PLAN FOR NEXT SESSION: Reassess for positional vertigo and treat as appropriate (Add CRM and re-cert if needed); assess FGA and complete MCTSIB; ask how her exercises went and progress as needed for habituation(?)   Kameisha Malicki W., PT 05/08/2024, 12:36 PM  [facility] [address] [place], [place] [address] Phone # [phone] Fax # [phone]  Referring diagnosis? R42 (ICD-10-CM) - Vertigo  Treatment diagnosis? (if different than referring diagnosis) R 42, R26.81 What was this (referring dx) caused by? []  Surgery []  Fall [x]  Ongoing issue []  Arthritis []  Other: ____________  Laterality: []  Rt []  Lt [x]  Both  Check all possible CPT codes:  *CHOOSE 10 OR LESS*    See Planned Interventions listed in the Plan section of the Evaluation.

## 2024-05-09 DIAGNOSIS — H61002 Unspecified perichondritis of left external ear: Secondary | ICD-10-CM | POA: Diagnosis not present

## 2024-05-09 DIAGNOSIS — D225 Melanocytic nevi of trunk: Secondary | ICD-10-CM | POA: Diagnosis not present

## 2024-05-09 DIAGNOSIS — L821 Other seborrheic keratosis: Secondary | ICD-10-CM | POA: Diagnosis not present

## 2024-05-17 ENCOUNTER — Ambulatory Visit: Admitting: Physical Therapy

## 2024-05-17 ENCOUNTER — Encounter: Payer: Self-pay | Admitting: Physical Therapy

## 2024-05-17 DIAGNOSIS — R2681 Unsteadiness on feet: Secondary | ICD-10-CM

## 2024-05-17 DIAGNOSIS — R42 Dizziness and giddiness: Secondary | ICD-10-CM | POA: Diagnosis not present

## 2024-05-17 NOTE — Therapy (Signed)
 OUTPATIENT PHYSICAL THERAPY VESTIBULAR TREATMENT     Patient Name: Raven Marks MRN: [id number] DOB:January 21, 1956, 68 y.o., female Today's Date: 05/17/2024  END OF SESSION:  PT End of Session - 05/17/24 0930     Visit Number 2    Number of Visits 9    Date for PT Re-Evaluation 06/08/24    Authorization Type [employer]    Authorization Time Period 05/08/2024-06/08/2024    Authorization - Visit Number 2    Authorization - Number of Visits 9    PT Start Time 0930    PT Stop Time 1000    PT Time Calculation (min) 30 min    Activity Tolerance Patient tolerated treatment well    Behavior During Therapy [facility] for tasks assessed/performed           Past Medical History:  Diagnosis Date   Cancer of central portion of right female breast (HCC) 01/08/2016   stage 1A   Complication of anesthesia    vertigo   Congenital absence of femur    right   Endometrioid adenocarcinoma    Stage IA, grade 2   GERD (gastroesophageal reflux disease)    History of external beam radiation therapy 10/27/16-12/24/16   right breast 50.4 Gy in 28 fracitons, boost 10 Gy in 5 fractions   PONV (postoperative nausea and vomiting)    Presence of artificial leg    Right, 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 Cornett, MD;  Location: [facility] OR;  Service: General;  Laterality: N/A;   ERCP N/A 10/12/2013   Procedure: ENDOSCOPIC RETROGRADE CHOLANGIOPANCREATOGRAPHY (ERCP);  Surgeon: Norleen JAYSON Hint, MD;  Location: [facility] ENDOSCOPY;  Service: Endoscopy;  Laterality: N/A;   PARTIAL MASTECTOMY WITH AXILLARY SENTINEL LYMPH NODE BIOPSY Right 03/09/2016   at [facility]   REPLACEMENT TOTAL KNEE Left 2010   Dr. Hiram   ROBOTIC  ASSISTED TOTAL HYSTERECTOMY WITH BILATERAL SALPINGO OOPHERECTOMY  10/08/2010   Robotic, BSO, bilateral pelvic/ R periaortic LND   Patient Active Problem List   Diagnosis Date Noted   Lipoma 07/27/2022   Vertigo 01/24/2020   Vestibular migraine 01/24/2020   Chronic migraine without aura without status migrainosus, not intractable 01/24/2020   BPPV (benign paroxysmal positional vertigo) 04/20/2018   Genetic testing 04/14/2016   Malignant neoplasm of central portion of right female breast (HCC) 01/08/2016   Endometrial adenocarcinoma (HCC) 03/16/2012    PCP: Okey Carlin Redbird, MD  REFERRING PROVIDER: Okey Carlin Redbird, MD   REFERRING DIAG: R42 (ICD-10-CM) - Vertigo   THERAPY DIAG:  Dizziness and giddiness  Unsteadiness on feet  ONSET DATE: 03/07/2024 MD referral  Rationale for Evaluation and Treatment: Rehabilitation  SUBJECTIVE:   SUBJECTIVE STATEMENT: Had a place taken off the L ear, so have had a hard time getting comfortable sleeping.  Feel the sinus pressure on these overcast days.  No dizziness.  Want to review the exercises and work on my neck.  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   Pt accompanied by: self  PERTINENT HISTORY: anxiety, spinal stenosis, RLE prosthesis due to birth defect - congenital absence of R femur, R breast CA treated with chemo  and radiation, endometrioid adenocarcinoma, Left TKA and hx of neck pain   PAIN:  Are you having pain? No  PRECAUTIONS: Other: hx of R prosthesis, above knee and uses cane  RED FLAGS: None   WEIGHT BEARING RESTRICTIONS: No  FALLS: Has patient fallen in last 6 months? No  LIVING ENVIRONMENT: Lives with: lives with their family Lives in: House/apartment Stairs:  Has following equipment at home: Single point cane  PLOF: Independent with household mobility with device and Independent with community mobility with device  PATIENT GOALS: to get rid of  dizziness  OBJECTIVE:    TODAY'S TREATMENT: 05/17/2024 Therapeutic Exercise: Pt wants to review/progress previous cervical exercises (from therapy bout several years ago) Performed cervical extension SNAG with towel, 3 reps x 5 sec, with added diagonal look up, 2 reps each side Performed cervical rotation SNAG with towel, 3 reps x 5 sec Neck retraction/chin tuck + gentle flexion for neck extensor stretch, 5 reps, 5 sec-pt reports good stretch  Self Care: Answered pt's questions about sleep position (given area that was recently excised from L ear by dermatology)  Answered pt's questions about neck positioning/cushion for 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-discussed options for cushioning her neck-travel neck pillow, bone neck pillow, extra towel cushion Answered pt's questions about prosthetic follow up-provided name of prosthetist at [facility] and discussed that she would need MD order for new prosthetic consult Discussed pt's response to gaze stabilization exercises on busy background-pt using wallpaper at home and NO dizziness symptoms with this exercise; discussed progression would be to standing (pt wants to hold on this until she gets prosthesis worked out) Discussed POC-pt would like to continue exercises at home for several weeks and return in about 3 weeks    PATIENT EDUCATION: Education details: See above Person educated: Patient Education method: Explanation, Demonstration, and Handouts Education comprehension: verbalized understanding and returned demonstration  ------------------------------------------------- Note: Objective measures were completed at Evaluation unless otherwise noted.  DIAGNOSTIC FINDINGS: NA for this episode  COGNITION: Overall cognitive status: Within functional limits for tasks assessed   POSTURE:  rounded shoulders, forward head, and weight shift left  Cervical ROM:    Active A/PROM (deg) eval  Flexion 35  Extension 40  Right  lateral flexion   Left lateral flexion   Right rotation 45  Left rotation 45  (Blank rows = not tested)   BED MOBILITY:  Independent  TRANSFERS: Assistive device utilized: None  Sit to stand: Modified independence Stand to sit: Modified independence  GAIT: Gait pattern: step through pattern and wide BOS Distance walked: clinic distances Assistive device utilized: Single point cane Level of assistance: Modified independence Comments: Reports she would like to look into new above knee prosthesis due to fitting issues since significant weight loss   PATIENT SURVEYS:  DHI: THE DIZZINESS HANDICAP INVENTORY (DHI)  P1. Does looking up increase your problem? 2 = Sometimes  E2. Because of your problem, do you feel frustrated? 2 = Sometimes  F3. Because of your problem, do you restrict your travel for business or recreation?  2 = Sometimes  P4. Does walking down the aisle of a supermarket increase your problems?  2 = Sometimes  F5. Because of your problem, do you have difficulty getting into or out of bed?  0 = No  F6. Does your problem significantly restrict your participation in social activities, such as going out to dinner, going to the movies, dancing, or going to parties? 2 = Sometimes  F7. Because of  your problem, do you have difficulty reading?  0 = No  P8. Does performing more ambitious activities such as sports, dancing, household chores (sweeping or putting dishes away) increase your problems?  0 = No  E9. Because of your problem, are you afraid to leave your home without having without having someone accompany you?  2 = Sometimes  E10. Because of your problem have you been embarrassed in front of others?  2 = Sometimes  P11. Do quick movements of your head increase your problem?  4 = Yes  F12. Because of your problem, do you avoid heights?  2 = Sometimes  P13. Does turning over in bed increase your problem?  4 = Yes  F14. Because of your problem, is it difficult for you to do  strenuous homework or yard work? 2 = Sometimes  E15. Because of your problem, are you afraid people may think you are intoxicated? 0 = No  F16. Because of your problem, is it difficult for you to go for a walk by yourself?  0 = No  P17. Does walking down a sidewalk increase your problem?  0 = No  E18.Because of your problem, is it difficult for you to concentrate 0 = No  F19. Because of your problem, is it difficult for you to walk around your house in the dark? 0 = No  E20. Because of your problem, are you afraid to stay home alone?  0 = No  E21. Because of your problem, do you feel handicapped? 0 = No  E22. Has the problem placed stress on your relationships with members of your family or friends? 0 = No  E23. Because of your problem, are you depressed?  0 = No  F24. Does your problem interfere with your job or household responsibilities?  0 = No  P25. Does bending over increase your problem?  2 = Sometimes  TOTAL 28    DHI Scoring Instructions  The patient is asked to answer each question as it pertains to dizziness or unsteadiness problems, specifically  considering their condition during the last month. Questions are designed to incorporate functional (F), physical  (P), and emotional (E) impacts on disability.   Scores greater than 10 points should be referred to balance specialists for further evaluation.   16-34 Points (mild handicap)  36-52 Points (moderate handicap)  54+ Points (severe handicap)  Minimally Detectable Change: 17 points ([address] [place], 1990)  [place]orphlet, G. SHAUNNA. and Murdo, C. W. (1990). The development of the Dizziness Handicap Inventory. Archives of Otolaryngology - Head and Neck Surgery 116(4): [id number].   VESTIBULAR ASSESSMENT:  GENERAL OBSERVATION: No acute distress, but pt does report being anxious   SYMPTOM BEHAVIOR:  Subjective history: Watching things scrolling and turning head quickly.  I have had vertigo in the past, and it was on the right  side.  I try not to lie on the right side.  I know my limits. Has hx of migraines, has hx of tinnitus (longstanding)  Non-Vestibular symptoms: neck pain and migraine symptoms  Type of dizziness: Imbalance (Disequilibrium), Unsteady with head/body turns, Lightheadedness/Faint, and sometimes a quick spinning  Frequency: occasional-weather related  Duration: quick spinning; the lightheadedness/full feeling lasts longer  Aggravating factors: Induced by motion: turning head quickly and scrolling on a screen rolling to R, weather-overcast and rainy  Relieving factors: head stationary, closing eyes, and slow movements  Progression of symptoms: better  OCULOMOTOR EXAM:  Wears distance glasses, wears reading glasses; cannot do transition lenses  Ocular Alignment: normal  Ocular ROM: No Limitations  Spontaneous Nystagmus: absent  Gaze-Induced Nystagmus: absent  Smooth Pursuits: intact  Saccades: intact  Convergence/Divergence: 2 cm   VESTIBULAR - OCULAR REFLEX:   Slow VOR: Normal, no symptoms  VOR Cancellation: Normal  Head-Impulse Test: HIT Right: negative HIT Left: negative  Dynamic Visual Acuity: NT   POSITIONAL TESTING: Right Dix-Hallpike: no nystagmus Left Dix-Hallpike: no nystagmus Right Roll Test: NT (due to pt's prosthesis and she typically does not lie on R side) Left Roll Test: no nystagmus  MOTION SENSITIVITY:  NOT TEST AT EVAL  Motion Sensitivity Quotient Intensity: 0 = none, 1 = Lightheaded, 2 = Mild, 3 = Moderate, 4 = Severe, 5 = Vomiting  Intensity  1. Sitting to supine   2. Supine to L side   3. Supine to R side   4. Supine to sitting   5. L Hallpike-Dix   6. Up from L    7. R Hallpike-Dix   8. Up from R    9. Sitting, head tipped to L knee   10. Head up from L knee   11. Sitting, head tipped to R knee   12. Head up from R knee   13. Sitting head turns x5   14.Sitting head nods x5   15. In stance, 180 turn to L    16. In stance, 180 turn to R       M-CTSIB   Condition 1: Firm Surface, EO 30 Sec, Normal Sway  Condition 2: Firm Surface, EC 30 Sec, Mild Sway (increased weightshift to LLE)  Condition 3: Foam Surface, EO NT Sec, NT Sway  Condition 4: Foam Surface, EC NT Sec, NT Sway                                                                                                                               TREATMENT DATE: 05/08/2024   PATIENT EDUCATION: Education details: Eval results, POC; reviewed some of her previous HEP (from bout of therapy 5 years ago)-pt has resumed doing these and they would be appropriate  Person educated: Patient Education method: Explanation Education comprehension: verbalized understanding  HOME EXERCISE PROGRAM: Pt has previous HEP-she is doing standing VOR (advised to try on busy background), okay to bring symptoms up to 3-4/10 and then let them settle;  Also she has done neck stretches and she is comfortable to resume these  GOALS: Goals reviewed with patient? Yes  SHORT TERM GOALS: = LONG TERM GOALS: Target date: 06/08/2024  Pt will be independent with HEP for improved dizziness, balance. Baseline:  Goal status: INITIAL  2.  Pt to report 0/10 dizziness with bed mobility. Baseline:  Goal status: INITIAL  3.  DHI score to improve to 18 or less for improved dizziness in daily activities. Baseline: 28 Goal status: INITIAL  4.  FGA score to be assessed and goal to be set as appropriate. Baseline: TBA Goal status: INITIAL  ASSESSMENT:  CLINICAL IMPRESSION: Pt presents today with reports of NO dizziness with bed mobility since last visit.  She also reports no symptoms, no dizziness with gaze stabilization exercises.  Advised she can continue these if she would like or d/c, as they are not bringing on symptoms.  Skilled PT session focused on neck flexibility exercises and updating HEP. Pt has had recent dermatology procedure on L ear, so did not proceed to re-assess BPPV today (and pt is not having  symptoms).  Also, pt wants to hold on further assessing balance, as she is trying to get into prosthetist for new prosthesis evaluation; she feels her imbalance in due in large part to ill-fitting prosthesis with her weightloss.  No complaints at end of session and she plans to return in about 3 weeks to re-assess dizziness as appropriate.    OBJECTIVE IMPAIRMENTS: Abnormal gait, decreased balance, dizziness, and impaired flexibility.   ACTIVITY LIMITATIONS: bending, bed mobility, and locomotion level  PARTICIPATION LIMITATIONS: meal prep, cleaning, laundry, driving, shopping, and community activity  PERSONAL FACTORS: 3+ comorbidities: see above are also affecting patient's functional outcome.   REHAB POTENTIAL: Good  CLINICAL DECISION MAKING: Stable/uncomplicated  EVALUATION COMPLEXITY: Low   PLAN:  PT FREQUENCY: 1-2x/week  PT DURATION: 4 weeks plus eval visit  PLANNED INTERVENTIONS: 97750- Physical Performance Testing, 97110-Therapeutic exercises, 97530- Therapeutic activity, 97112- Neuromuscular re-education, 97535- Self Care, 02859- Manual therapy, [phone]- Gait training, Patient/Family education, Balance training, and Vestibular training  PLAN FOR NEXT SESSION: Reassess for positional vertigo and treat as appropriate (Add CRM and re-cert if needed); assess FGA and complete MCTSIB; ask how her exercises went and progress as needed for habituation(?)-Consider d/c if no further dizziness   Kamau Weatherall W., PT 05/17/2024, 10:47 AM  [facility] [address] [place], [address] [place], [place] [address] Phone # [phone] Fax # [phone]  Referring diagnosis? R42 (ICD-10-CM) - Vertigo  Treatment diagnosis? (if different than referring diagnosis) R 42, R26.81 What was this (referring dx) caused by? []  Surgery []  Fall [x]  Ongoing issue []  Arthritis []  Other: ____________  Laterality: []  Rt []  Lt [x]  Both  Check all possible CPT  codes:  *CHOOSE 10 OR LESS*    See Planned Interventions listed in the Plan section of the Evaluation.

## 2024-06-06 DIAGNOSIS — H61002 Unspecified perichondritis of left external ear: Secondary | ICD-10-CM | POA: Diagnosis not present

## 2024-06-07 ENCOUNTER — Ambulatory Visit: Payer: Self-pay | Admitting: Physical Therapy

## 2024-06-28 ENCOUNTER — Ambulatory Visit (HOSPITAL_BASED_OUTPATIENT_CLINIC_OR_DEPARTMENT_OTHER): Payer: Medicare PPO | Admitting: Obstetrics & Gynecology

## 2024-07-04 DIAGNOSIS — M25562 Pain in left knee: Secondary | ICD-10-CM | POA: Diagnosis not present

## 2024-07-05 ENCOUNTER — Ambulatory Visit: Admitting: Physical Therapy

## 2024-07-24 ENCOUNTER — Encounter: Payer: Self-pay | Admitting: Physical Therapy

## 2024-07-24 ENCOUNTER — Ambulatory Visit: Attending: Family Medicine | Admitting: Physical Therapy

## 2024-07-24 DIAGNOSIS — R2681 Unsteadiness on feet: Secondary | ICD-10-CM | POA: Diagnosis not present

## 2024-07-24 DIAGNOSIS — R42 Dizziness and giddiness: Secondary | ICD-10-CM | POA: Insufficient documentation

## 2024-07-24 NOTE — Therapy (Signed)
 OUTPATIENT PHYSICAL THERAPY VESTIBULAR TREATMENT/RECERT/PROGRESS NOTE     Patient Name: Raven Marks MRN: [id number] DOB:29-May-1956, 68 y.o., female Today's Date: 07/24/2024  Progress Note Reporting Period 05/08/2024 to 07/24/2024  See note below for Objective Data and Assessment of Progress/Goals.     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) 01/08/2016   stage 1A   Complication of anesthesia    vertigo   Congenital absence of femur    right   Endometrioid adenocarcinoma    Stage IA, grade 2   GERD (gastroesophageal reflux disease)    History of external beam radiation therapy 10/27/16-12/24/16   right breast 50.4 Gy in 28 fracitons, boost 10 Gy in 5 fractions   PONV (postoperative nausea and vomiting)    Presence of artificial leg    Right, 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 Cornett, MD;  Location: [facility] OR;  Service: General;  Laterality: N/A;   ERCP N/A 10/12/2013   Procedure: ENDOSCOPIC RETROGRADE CHOLANGIOPANCREATOGRAPHY (ERCP);  Surgeon: Norleen JAYSON Hint, MD;  Location: [facility] ENDOSCOPY;  Service: Endoscopy;  Laterality: N/A;   PARTIAL MASTECTOMY WITH AXILLARY SENTINEL LYMPH NODE BIOPSY Right 03/09/2016   at [facility]   REPLACEMENT TOTAL KNEE Left 2010   Dr. Hiram   ROBOTIC ASSISTED TOTAL HYSTERECTOMY WITH BILATERAL SALPINGO OOPHERECTOMY  10/08/2010   Robotic, BSO, bilateral pelvic/ R periaortic LND   Patient Active Problem List   Diagnosis Date Noted   Lipoma 07/27/2022   Vertigo 01/24/2020   Vestibular migraine 01/24/2020   Chronic migraine without aura without status migrainosus, not intractable 01/24/2020   BPPV (benign paroxysmal positional vertigo) 04/20/2018   Genetic testing 04/14/2016   Malignant neoplasm of central portion of right female breast (HCC) 01/08/2016   Endometrial adenocarcinoma (HCC) 03/16/2012    PCP: Okey Carlin Redbird, MD  REFERRING PROVIDER: Okey Carlin Redbird, MD   REFERRING DIAG: R42 (ICD-10-CM) - Vertigo   THERAPY DIAG:  Dizziness and giddiness  Unsteadiness on feet  ONSET DATE: 03/07/2024 MD referral  Rationale for Evaluation and Treatment: Rehabilitation  SUBJECTIVE:   SUBJECTIVE STATEMENT: Have been getting sick with viruses that the granddaughters have given to me.  Have noticed that with looking down for prolonged periods for reading, when I look back up, I get a small wave of dizziness.  Sometimes have the pressure feeling on my neck    Pt accompanied by: self  PERTINENT HISTORY: anxiety, spinal stenosis, RLE prosthesis due to birth defect - congenital absence of R femur, R breast CA treated with chemo and radiation, endometrioid adenocarcinoma, Left TKA and hx  of neck pain   PAIN:  Are you having pain? No  PRECAUTIONS: Other: hx of R prosthesis, above knee and uses cane  RED FLAGS: None   WEIGHT BEARING RESTRICTIONS: No  FALLS: Has patient fallen in last 6 months? No  LIVING ENVIRONMENT: Lives with: lives with their family Lives in: House/apartment Stairs:  Has following equipment at home: Single point cane  PLOF: Independent with household mobility with device and Independent with community mobility with device  PATIENT  GOALS: to get rid of dizziness  OBJECTIVE:    TODAY'S TREATMENT: 07/24/2024 Activity Comments  DHI:  16 Decreased from 28  Neck extension 40 degrees Neck flexion 30 degrees Neck rotation R 60 degrees Neck rotation L 55 degrees L lateral flexion 20 degrees R lateral flexion 20 degrees No pain    stiffness  Reviewed neck stretches See below-good form with minimal cues  Reviewed gaze stabilization from previous HEP No dizziness reported             Therapeutic Exercise: Pt wants to review/progress previous cervical exercises (from therapy bout several years ago) Performed cervical extension SNAG with towel, 3 reps x 5 sec, with added diagonal look up, 1 rep each side Performed cervical rotation SNAG with towel, 3 reps x 5 sec Neck retraction/chin tuck + gentle flexion for neck extensor stretch, 5 reps, 5 sec-pt reports good stretch  Self Care: Discussed balance-pt wants to hold on further balance assessment until she looks into new prosthesis (she asks for information on prosthetist again)-provided contact information for Garrel Mulch or Shaquoya Cosper W. [facility] Did not bring on dizziness with any motions today in therapy and pt declines positional testing, as she is not having any problems with positional dizziness at this time.  Did discuss postural awareness and positioning with prolonged sitting tasks-discussed having book or phone more in line with her eyes to avoid prolonged neck flexion and discussed taking posture breaks to avoid prolonged neck flexion with reading Pt to follow up with MD about possibility of neck injections to help with neck stiffness Discussed POC-completed recert today to cover this visit.  Pt feels strongly about keeping chart open until after she sees MD for neck-advised we will keep chart open x 30 days and then discuss if she has not had dizziness/hasn't had to return   PATIENT EDUCATION: Education details: See above Person educated: Patient Education  method: Explanation, Demonstration, and Handouts Education comprehension: verbalized understanding and returned demonstration  ------------------------------------------------- Note: Objective measures were completed at Evaluation unless otherwise noted.  DIAGNOSTIC FINDINGS: NA for this episode  COGNITION: Overall cognitive status: Within functional limits for tasks assessed   POSTURE:  rounded shoulders, forward head, and weight shift left  Cervical ROM:    Active A/PROM (deg) eval  Flexion 35  Extension 40  Right lateral flexion   Left lateral flexion   Right rotation 45  Left rotation 45  (Blank rows = not tested)   BED MOBILITY:  Independent  TRANSFERS: Assistive device utilized: None  Sit to stand: Modified independence Stand to sit: Modified independence  GAIT: Gait pattern: step through pattern and wide BOS Distance walked: clinic distances Assistive device utilized: Single point cane Level of assistance: Modified independence Comments: Reports she would like to look into new above knee prosthesis due to fitting issues since significant weight loss   PATIENT SURVEYS:  DHI: THE DIZZINESS HANDICAP INVENTORY (DHI)  P1. Does looking up increase your problem? 2 = Sometimes  E2. Because of your  problem, do you feel frustrated? 2 = Sometimes  F3. Because of your problem, do you restrict your travel for business or recreation?  2 = Sometimes  P4. Does walking down the aisle of a supermarket increase your problems?  2 = Sometimes  F5. Because of your problem, do you have difficulty getting into or out of bed?  0 = No  F6. Does your problem significantly restrict your participation in social activities, such as going out to dinner, going to the movies, dancing, or going to parties? 2 = Sometimes  F7. Because of your problem, do you have difficulty reading?  0 = No  P8. Does performing more ambitious activities such as sports, dancing, household chores (sweeping or  putting dishes away) increase your problems?  0 = No  E9. Because of your problem, are you afraid to leave your home without having without having someone accompany you?  2 = Sometimes  E10. Because of your problem have you been embarrassed in front of others?  2 = Sometimes  P11. Do quick movements of your head increase your problem?  4 = Yes  F12. Because of your problem, do you avoid heights?  2 = Sometimes  P13. Does turning over in bed increase your problem?  4 = Yes  F14. Because of your problem, is it difficult for you to do strenuous homework or yard work? 2 = Sometimes  E15. Because of your problem, are you afraid people may think you are intoxicated? 0 = No  F16. Because of your problem, is it difficult for you to go for a walk by yourself?  0 = No  P17. Does walking down a sidewalk increase your problem?  0 = No  E18.Because of your problem, is it difficult for you to concentrate 0 = No  F19. Because of your problem, is it difficult for you to walk around your house in the dark? 0 = No  E20. Because of your problem, are you afraid to stay home alone?  0 = No  E21. Because of your problem, do you feel handicapped? 0 = No  E22. Has the problem placed stress on your relationships with members of your family or friends? 0 = No  E23. Because of your problem, are you depressed?  0 = No  F24. Does your problem interfere with your job or household responsibilities?  0 = No  P25. Does bending over increase your problem?  2 = Sometimes  TOTAL 28    DHI Scoring Instructions  The patient is asked to answer each question as it pertains to dizziness or unsteadiness problems, specifically  considering their condition during the last month. Questions are designed to incorporate functional (F), physical  (P), and emotional (E) impacts on disability.   Scores greater than 10 points should be referred to balance specialists for further evaluation.   16-34 Points (mild handicap)  36-52 Points  (moderate handicap)  54+ Points (severe handicap)  Minimally Detectable Change: 17 points ([address] [place], 1990)  [place], G. SHAUNNA. and [place], C. W. (1990). The development of the Dizziness Handicap Inventory. Archives of Otolaryngology - Head and Neck Surgery 116(4): [id number].   VESTIBULAR ASSESSMENT:  GENERAL OBSERVATION: No acute distress, but pt does report being anxious   SYMPTOM BEHAVIOR:  Subjective history: Watching things scrolling and turning head quickly.  I have had vertigo in the past, and it was on the right side.  I try not to lie on the right side.  I  know my limits. Has hx of migraines, has hx of tinnitus (longstanding)  Non-Vestibular symptoms: neck pain and migraine symptoms  Type of dizziness: Imbalance (Disequilibrium), Unsteady with head/body turns, Lightheadedness/Faint, and sometimes a quick spinning  Frequency: occasional-weather related  Duration: quick spinning; the lightheadedness/full feeling lasts longer  Aggravating factors: Induced by motion: turning head quickly and scrolling on a screen rolling to R, weather-overcast and rainy  Relieving factors: head stationary, closing eyes, and slow movements  Progression of symptoms: better  OCULOMOTOR EXAM:  Wears distance glasses, wears reading glasses; cannot do transition lenses  Ocular Alignment: normal  Ocular ROM: No Limitations  Spontaneous Nystagmus: absent  Gaze-Induced Nystagmus: absent  Smooth Pursuits: intact  Saccades: intact  Convergence/Divergence: 2 cm   VESTIBULAR - OCULAR REFLEX:   Slow VOR: Normal, no symptoms  VOR Cancellation: Normal  Head-Impulse Test: HIT Right: negative HIT Left: negative  Dynamic Visual Acuity: NT   POSITIONAL TESTING: Right Dix-Hallpike: no nystagmus Left Dix-Hallpike: no nystagmus Right Roll Test: NT (due to pt's prosthesis and she typically does not lie on R side) Left Roll Test: no nystagmus  MOTION SENSITIVITY:  NOT TEST AT EVAL  Motion Sensitivity  Quotient Intensity: 0 = none, 1 = Lightheaded, 2 = Mild, 3 = Moderate, 4 = Severe, 5 = Vomiting  Intensity  1. Sitting to supine   2. Supine to L side   3. Supine to R side   4. Supine to sitting   5. L Hallpike-Dix   6. Up from L    7. R Hallpike-Dix   8. Up from R    9. Sitting, head tipped to L knee   10. Head up from L knee   11. Sitting, head tipped to R knee   12. Head up from R knee   13. Sitting head turns x5   14.Sitting head nods x5   15. In stance, 180 turn to L    16. In stance, 180 turn to R       M-CTSIB  Condition 1: Firm Surface, EO 30 Sec, Normal Sway  Condition 2: Firm Surface, EC 30 Sec, Mild Sway (increased weightshift to LLE)  Condition 3: Foam Surface, EO NT Sec, NT Sway  Condition 4: Foam Surface, EC NT Sec, NT Sway                                                                                                                               TREATMENT DATE: 05/08/2024   PATIENT EDUCATION: Education details: Eval results, POC; reviewed some of her previous HEP (from bout of therapy 5 years ago)-pt has resumed doing these and they would be appropriate  Person educated: Patient Education method: Explanation Education comprehension: verbalized understanding  HOME EXERCISE PROGRAM: Pt has previous HEP-she is doing standing VOR (advised to try on busy background), okay to bring symptoms up to 3-4/10 and then let them settle;  Also she has done neck stretches and  she is comfortable to resume these  GOALS: Goals reviewed with patient? Yes  SHORT TERM GOALS: = LONG TERM GOALS: Target date: 06/08/2024>ONGOING TARGET for LTGS:  08/24/2024  Pt will be independent with HEP for improved dizziness, balance. Baseline: reviewed 07/24/2024 Goal status: IN PROGRESS10/28/2025  2.  Pt to report 0/10 dizziness with bed mobility. Baseline: goes slow, no dizziness Goal status: MET  3.  DHI score to improve to 18 or less for improved dizziness in daily  activities. Baseline: 28>16 Goal status: MET 07/24/2024  4.  FGA score to be assessed and goal to be set as appropriate. Baseline: TBA Goal status: DEFERRED  5.  DHI score to improve to 8 or less, for improved dizziness in daily activities.  Baseline:  16  Goal status:  INITIAL 07/24/2024  ASSESSMENT:  CLINICAL IMPRESSION: Progress Note: Pt presents today after being gone from therapy since late August.  Initially, this was a planned time away from therapy to trial her exercise and make sure dizziness was cleared.  However, she cancelled several appointments due to illness.  She returns today and reports overall less dizziness, with DHI score decreased from 28 to 16.  She is still concerned about stiffness in her neck and hasn't done her neck exercises.  She reports only minimal dizziness upon looking up from a prolonged looking down time, such as with prolonged reading.  With exercises and motions in session today, PT was not able to bring on dizziness.  She is actively planning to follow up with prosthetist about new prosthesis and MD about possibility of injections for neck to help with stiffness.  Recert completed today to cover today's visit and in the event she has recurrence of BPPV needing to be treated in the next 30 days; if not, plan to discharge at that time.   OBJECTIVE IMPAIRMENTS: Abnormal gait, decreased balance, dizziness, and impaired flexibility.   ACTIVITY LIMITATIONS: bending, bed mobility, and locomotion level  PARTICIPATION LIMITATIONS: meal prep, cleaning, laundry, driving, shopping, and community activity  PERSONAL FACTORS: 3+ comorbidities: see above are also affecting patient's functional outcome.   REHAB POTENTIAL: Good  CLINICAL DECISION MAKING: Stable/uncomplicated  EVALUATION COMPLEXITY: Low   PLAN:  PT FREQUENCY: 3 visits over  PT DURATION: 4 weeks including recert visit 07/24/2024  PLANNED INTERVENTIONS: 97750- Physical Performance Testing,  97110-Therapeutic exercises, 97530- Therapeutic activity, 97112- Neuromuscular re-education, 97535- Self Care, 02859- Manual therapy, [phone]- Gait training, Patient/Family education, Balance training, and Vestibular training  PLAN FOR NEXT SESSION: Hold chart open x 30 days in the event pt returns for BPPV.  Otherwise, will plan to discharge after this time frame.    STARLET GREIG ORN., PT 07/24/2024, 3:23 PM  [facility] [address] [place], [address] [place], [place] [address] Phone # [phone] Fax # [phone]  Referring diagnosis? R42 (ICD-10-CM) - Vertigo  Treatment diagnosis? (if different than referring diagnosis) R 42, R26.81 What was this (referring dx) caused by? []  Surgery []  Fall [x]  Ongoing issue []  Arthritis []  Other: ____________  Laterality: []  Rt []  Lt [x]  Both  Check all possible CPT codes:  *CHOOSE 10 OR LESS*    See Planned Interventions listed in the Plan section of the Evaluation.

## 2024-08-06 ENCOUNTER — Ambulatory Visit (HOSPITAL_BASED_OUTPATIENT_CLINIC_OR_DEPARTMENT_OTHER): Admitting: Obstetrics & Gynecology

## 2024-08-06 ENCOUNTER — Encounter (HOSPITAL_BASED_OUTPATIENT_CLINIC_OR_DEPARTMENT_OTHER): Payer: Self-pay | Admitting: Obstetrics & Gynecology

## 2024-08-06 VITALS — BP 135/87 | HR 66 | Wt 139.5 lb

## 2024-08-06 DIAGNOSIS — Z1331 Encounter for screening for depression: Secondary | ICD-10-CM

## 2024-08-06 DIAGNOSIS — Z01419 Encounter for gynecological examination (general) (routine) without abnormal findings: Secondary | ICD-10-CM

## 2024-08-06 DIAGNOSIS — C541 Malignant neoplasm of endometrium: Secondary | ICD-10-CM

## 2024-08-06 DIAGNOSIS — C50111 Malignant neoplasm of central portion of right female breast: Secondary | ICD-10-CM

## 2024-08-06 DIAGNOSIS — Z853 Personal history of malignant neoplasm of breast: Secondary | ICD-10-CM | POA: Diagnosis not present

## 2024-08-06 DIAGNOSIS — Z170 Estrogen receptor positive status [ER+]: Secondary | ICD-10-CM | POA: Diagnosis not present

## 2024-08-06 DIAGNOSIS — Z8542 Personal history of malignant neoplasm of other parts of uterus: Secondary | ICD-10-CM | POA: Diagnosis not present

## 2024-08-06 DIAGNOSIS — Z9189 Other specified personal risk factors, not elsewhere classified: Secondary | ICD-10-CM

## 2024-08-06 NOTE — Progress Notes (Unsigned)
 Breast and Pelvic Exam Patient name: DAVIANA Marks MRN [id number]  Date of birth: 30-Jul-1956 Chief Complaint:   Gynecologic Exam  History of Present Illness:   Raven Marks is a 68 y.o. G1P1 Caucasian female being seen today for breast and pelvic exam.  Denies vaginal bleeding.  Doing well.  Considering new prothesis for her leg.  Has had current one for 30 + years.  Patient's last menstrual period was 09/27/2010.   Last pap: Hysterectomy. H/O abnormal pap: yes Last mammogram: 12/09/2023. Results were: normal. Family h/o breast cancer: yes maternal aunt and grandmother. Last colonoscopy: 11/21/2018. Results were: normal. Family h/o colorectal cancer: yes mother and father. Dexa:  2015.  Declines for now.  Would not take medication for treatment.      08/06/2024    2:55 PM 08/18/2023    1:17 PM 08/03/2023    9:45 AM 06/25/2022    9:28 AM 02/17/2017    4:08 PM  Depression screen PHQ 2/9  Decreased Interest 0 0 0 0 0  Down, Depressed, Hopeless 0 0 0 0 0  PHQ - 2 Score 0 0 0 0 0    Review of Systems:   Pertinent items are noted in HPI Denies any urinary or bowel changes.  Denies pelvic pain.   Pertinent History Reviewed:  Reviewed past medical,surgical, social and family history.  Reviewed problem list, medications and allergies. Physical Assessment:   Vitals:   08/06/24 1450  BP: 135/87  Pulse: 66  SpO2: 100%  Weight: 139 lb 8 oz (63.3 kg)  Body mass index is 23.95 kg/m.        Physical Examination:   General appearance - well appearing, and in no distress  Mental status - alert, oriented to person, place, and time  Psych:  She has a normal mood and affect  Skin - warm and dry, normal color, no suspicious lesions noted  Chest - effort normal, all lung fields clear to auscultation bilaterally  Heart - normal rate and regular rhythm  Neck:  midline trachea, no thyromegaly or nodules  Breasts - breasts appear normal, no suspicious masses, no skin or nipple  changes or  axillary nodes  Abdomen - soft, nontender, nondistended, no masses or organomegaly  Pelvic - VULVA: normal appearing vulva with no masses, tenderness or lesions   VAGINA: normal appearing vagina with normal color and discharge, no lesions   CERVIX: surgically absent  Thin prep pap is not indicated today  UTERUS: surgically absent  ADNEXA: No adnexal masses or tenderness noted.  Rectal - normal rectal, good sphincter tone, no masses felt  Extremities:  No swelling or varicosities noted  Chaperone present for exam  No results found for this or any previous visit (from the past 24 hours).  Assessment & Plan:  1. Encounter for gynecologic examination for high-risk patient covered by Medicare (Primary) - Pap smear not indicated - Mammogram 12/09/2023 - Colonoscopy 2020.  F/u 5 years initially recommended but she feels Dr. Donnald recommended 7 years - Bone mineral density declined - lab work done with PCP, Dr. Okey - vaccines reviewed/updated  2. Malignant neoplasm of central portion of right breast in female, estrogen receptor positive (HCC) - released from oncology  3. Endometrial adenocarcinoma (HCC) - doing yearly pelvic exams.  Exam normal today.  Pt reassured.    No orders of the defined types were placed in this encounter.   Meds: No orders of the defined types were placed in this encounter.  Follow-up: Return in about 1 year (around 08/06/2025).  Total time with pt including documentation time 26 minutes  Ronal GORMAN Pinal, MD 08/06/2024 3:40 PM

## 2024-08-08 DIAGNOSIS — H2513 Age-related nuclear cataract, bilateral: Secondary | ICD-10-CM | POA: Diagnosis not present

## 2024-08-08 DIAGNOSIS — H524 Presbyopia: Secondary | ICD-10-CM | POA: Diagnosis not present

## 2024-10-27 ENCOUNTER — Emergency Department (HOSPITAL_BASED_OUTPATIENT_CLINIC_OR_DEPARTMENT_OTHER)
Admission: EM | Admit: 2024-10-27 | Discharge: 2024-10-27 | Disposition: A | Discharge status: Home / Self Care | Attending: Emergency Medicine | Admitting: Emergency Medicine

## 2024-10-27 ENCOUNTER — Encounter (HOSPITAL_BASED_OUTPATIENT_CLINIC_OR_DEPARTMENT_OTHER): Payer: Self-pay | Admitting: Emergency Medicine

## 2024-10-27 ENCOUNTER — Other Ambulatory Visit: Payer: Self-pay

## 2024-10-27 DIAGNOSIS — Y92000 Kitchen of unspecified non-institutional (private) residence as  the place of occurrence of the external cause: Secondary | ICD-10-CM | POA: Diagnosis not present

## 2024-10-27 DIAGNOSIS — S0990XA Unspecified injury of head, initial encounter: Secondary | ICD-10-CM | POA: Diagnosis present

## 2024-10-27 DIAGNOSIS — W01198A Fall on same level from slipping, tripping and stumbling with subsequent striking against other object, initial encounter: Secondary | ICD-10-CM | POA: Diagnosis not present

## 2024-10-27 DIAGNOSIS — W19XXXA Unspecified fall, initial encounter: Secondary | ICD-10-CM

## 2024-10-27 DIAGNOSIS — S0101XA Laceration without foreign body of scalp, initial encounter: Secondary | ICD-10-CM | POA: Diagnosis not present

## 2024-10-27 MED ORDER — LIDOCAINE-EPINEPHRINE (PF) 2 %-1:200000 IJ SOLN
5.0000 mL | Freq: Once | INTRAMUSCULAR | Status: AC
  Administered 2024-10-27: 5 mL
  Filled 2024-10-27: qty 20

## 2024-10-27 NOTE — ED Notes (Signed)
 DC paperwork given and verbally understood.

## 2024-10-27 NOTE — Discharge Instructions (Signed)
 1.  Apply an ice pack to the area of swelling on the back of your scalp for about 20 minutes every few hours.  You may apply antibiotic ointment twice a day over the wound. 2.  You may shower tomorrow. 3.  Staples should be removed in 10 days.  This can be done at any urgent care, emergency department or primary care office. 4.  Return to the emergency department if you have a bad headache, any confusion, visual disturbance, nausea or vomiting or other concerning changes.

## 2024-10-27 NOTE — ED Provider Notes (Signed)
 " [place] [facility] AT [facility] Provider Note   CSN: [id number] Arrival date & time: 10/27/24  1738     Patient presents with: Raven Marks is a 69 y.o. female.   HPI Patient where she had gone outside to clear up some snow on her steps.  She was back in her house and had forgotten to dry off her cane.  She reports she put the cane down on the floor in the kitchen and it slipped out quickly causing her to fall backwards.  She struck her head on her kitchen floor.  Patient reports she got a laceration that was bleeding.  She and her husband applied pressure to it until it finally stopped bleeding.  Patient denies loss of consciousness.  She denies she has headache.  She denies nausea vomiting or visual disturbance.  Patient denies weakness numbness or tingling to extremities or neck pain.  Patient is not anticoagulated.    Prior to Admission medications  Medication Sig Start Date End Date Taking? Authorizing Provider  Acetaminophen  500 MG coapsule Take 1,000 mg by mouth every 4 (four) hours as needed.     [provider]  ALPRAZolam  (XANAX ) 0.25 MG tablet Take 1-2 tabs (0.25mg -0.50mg ) 30-60 minutes before procedure. May repeat if needed.Do not drive. 01/24/20   Ines Onetha NOVAK, MD  cetirizine (ZYRTEC) 5 MG tablet Take 5 mg by mouth daily.    [provider]  diclofenac sodium (VOLTAREN) 1 % GEL as needed. 05/18/16   [provider]  meclizine  (ANTIVERT ) 25 MG tablet Take 25 mg by mouth every 6 (six) hours as needed for dizziness.    [provider]  Propylene Glycol (SYSTANE COMPLETE OP) Apply to eye in the morning, at noon, and at bedtime.    [provider]  triamcinolone  ointment (KENALOG ) 0.5 % Apply 1 Application topically 2 (two) times daily. Can use for up to 7 days. 08/03/23   Cleotilde Ronal RAMAN, MD    Allergies: Codeine, Hydromorphone , Morphine , and Penicillins    Review of Systems  Updated Vital  Signs BP (!) 153/99 (BP Location: Left Arm)   Pulse 95   Temp 98.3 F (36.8 C) (Oral)   Resp 18   Ht 5' 4 (1.626 m)   Wt 63.3 kg   LMP 09/27/2010   SpO2 100%   BMI 23.95 kg/m   Physical Exam Constitutional:      Comments: Alert nontoxic well in appearance.  Normal mental status.  HENT:     Head:     Comments: Patient has a 4-1/2 cm laceration to the posterior left scalp.  This is linear.  Moderate gaping.  No active bleeding but there is a lot of blood present in the patient's hair.    Mouth/Throat:     Pharynx: Oropharynx is clear.  Pulmonary:     Effort: Pulmonary effort is normal.  Musculoskeletal:        General: Normal range of motion.  Neurological:     General: No focal deficit present.     Mental Status: She is oriented to person, place, and time.     Motor: No weakness.     Coordination: Coordination normal.  Psychiatric:        Mood and Affect: Mood normal.     (all labs ordered are listed, but only abnormal results are displayed) Labs Reviewed - No data to display  EKG: None  Radiology: No results found.   .Laceration Repair  Date/Time: 10/27/2024 6:23 PM  Performed by: Armenta Canning, MD Authorized by: Armenta Canning, MD   Consent:    Consent obtained:  Verbal   Consent given by:  Patient Anesthesia:    Anesthesia method:  Local infiltration   Local anesthetic:  Lidocaine  2% WITH epi Laceration details:    Location:  Scalp   Length (cm):  4.5   Depth (mm):  5 Exploration:    Wound extent: areolar tissue violated     Contaminated: no   Treatment:    Area cleansed with:  Saline   Amount of cleaning:  Standard Skin repair:    Repair method:  Staples   Number of staples:  6 Approximation:    Approximation:  Close Repair type:    Repair type:  Simple    Medications Ordered in the ED  lidocaine -EPINEPHrine  (XYLOCAINE  W/EPI) 2 %-1:200000 (PF) injection 5 mL (5 mLs Infiltration Given by Other 10/27/24 1816)                                     Medical Decision Making Patient had a mechanical  fall at home.  She struck the back of her head.  Patient not anticoagulated.  No loss of consciousness.  No nausea no vomiting no visual disturbance.  No neurologic disturbance.  At this time limited to finding of scalp laceration.  Scalp laceration cleaned anesthetized and closed with staples.  Patient is been counseled on home care for laceration and follow-up plan.  Voices understanding.      Final diagnoses:  Fall, initial encounter  Laceration of scalp, initial encounter    ED Discharge Orders     None          Armenta Canning, MD 10/27/24 1825  "

## 2024-10-27 NOTE — ED Triage Notes (Signed)
 Pt via pov from home after a fall in her kitchen. States her cane got wet and she slipped and fell, hitting the back/left side of her head. Pt denies LOC or other injuries. No thinners. Pt has gash that appears to be about 1.5 in length. Bleeding controlled. Pt a&o x4; nad noted.
